# Patient Record
Sex: Female | Born: 1975 | Race: White | Hispanic: No | Marital: Single | Smoking: Former smoker
Health system: Southern US, Community
[De-identification: ages and names within clinical notes are randomized; demographics above are authoritative.]

## PROBLEM LIST (undated history)

## (undated) DIAGNOSIS — F419 Anxiety disorder, unspecified: Secondary | ICD-10-CM

## (undated) DIAGNOSIS — T7840XA Allergy, unspecified, initial encounter: Secondary | ICD-10-CM

## (undated) DIAGNOSIS — F32A Depression, unspecified: Secondary | ICD-10-CM

## (undated) DIAGNOSIS — K602 Anal fissure, unspecified: Secondary | ICD-10-CM

## (undated) DIAGNOSIS — K644 Residual hemorrhoidal skin tags: Secondary | ICD-10-CM

## (undated) DIAGNOSIS — M1990 Unspecified osteoarthritis, unspecified site: Secondary | ICD-10-CM

## (undated) DIAGNOSIS — F329 Major depressive disorder, single episode, unspecified: Secondary | ICD-10-CM

## (undated) HISTORY — PX: WISDOM TOOTH EXTRACTION: SHX21

## (undated) HISTORY — DX: Allergy, unspecified, initial encounter: T78.40XA

## (undated) HISTORY — DX: Anxiety disorder, unspecified: F41.9

## (undated) HISTORY — DX: Residual hemorrhoidal skin tags: K64.4

## (undated) HISTORY — DX: Anal fissure, unspecified: K60.2

## (undated) HISTORY — DX: Depression, unspecified: F32.A

## (undated) HISTORY — PX: ORIF FINGER / THUMB FRACTURE: SUR932

## (undated) HISTORY — DX: Major depressive disorder, single episode, unspecified: F32.9

## (undated) HISTORY — DX: Unspecified osteoarthritis, unspecified site: M19.90

---

## 2005-02-11 ENCOUNTER — Other Ambulatory Visit: Admission: RE | Admit: 2005-02-11 | Discharge: 2005-02-11 | Payer: Self-pay | Admitting: Obstetrics and Gynecology

## 2005-08-12 ENCOUNTER — Other Ambulatory Visit: Admission: RE | Admit: 2005-08-12 | Discharge: 2005-08-12 | Payer: Self-pay | Admitting: Obstetrics and Gynecology

## 2006-03-07 ENCOUNTER — Ambulatory Visit: Payer: Self-pay | Admitting: Family Medicine

## 2006-03-16 ENCOUNTER — Other Ambulatory Visit: Admission: RE | Admit: 2006-03-16 | Discharge: 2006-03-16 | Payer: Self-pay | Admitting: Obstetrics and Gynecology

## 2006-03-31 ENCOUNTER — Ambulatory Visit: Payer: Self-pay | Admitting: Family Medicine

## 2006-05-05 ENCOUNTER — Ambulatory Visit: Payer: Self-pay | Admitting: Family Medicine

## 2006-05-05 LAB — CONVERTED CEMR LAB
Basophils Absolute: 0.1 10*3/uL (ref 0.0–0.1)
Basophils Relative: 0.8 % (ref 0.0–1.0)
Cholesterol: 191 mg/dL (ref 0–200)
MCHC: 32.7 g/dL (ref 30.0–36.0)
Monocytes Absolute: 0.6 10*3/uL (ref 0.2–0.7)
Neutrophils Relative %: 60.3 % (ref 43.0–77.0)
Platelets: 248 10*3/uL (ref 150–400)
RBC: 4.77 M/uL (ref 3.87–5.11)
RDW: 11.8 % (ref 11.5–14.6)
TSH: 1.37 microintl units/mL (ref 0.35–5.50)
Triglyceride fasting, serum: 77 mg/dL (ref 0–149)

## 2006-05-15 ENCOUNTER — Ambulatory Visit: Payer: Self-pay | Admitting: Family Medicine

## 2006-06-07 ENCOUNTER — Ambulatory Visit: Payer: Self-pay | Admitting: Family Medicine

## 2006-07-05 ENCOUNTER — Ambulatory Visit: Payer: Self-pay | Admitting: Family Medicine

## 2006-08-09 ENCOUNTER — Ambulatory Visit: Payer: Self-pay | Admitting: Family Medicine

## 2006-09-06 DIAGNOSIS — J309 Allergic rhinitis, unspecified: Secondary | ICD-10-CM | POA: Insufficient documentation

## 2006-09-06 DIAGNOSIS — G4700 Insomnia, unspecified: Secondary | ICD-10-CM | POA: Insufficient documentation

## 2006-09-08 ENCOUNTER — Encounter: Payer: Self-pay | Admitting: Family Medicine

## 2006-09-08 ENCOUNTER — Encounter (INDEPENDENT_AMBULATORY_CARE_PROVIDER_SITE_OTHER): Payer: Self-pay | Admitting: Family Medicine

## 2006-09-08 ENCOUNTER — Ambulatory Visit: Payer: Self-pay | Admitting: Family Medicine

## 2006-09-08 DIAGNOSIS — F329 Major depressive disorder, single episode, unspecified: Secondary | ICD-10-CM | POA: Insufficient documentation

## 2006-09-08 DIAGNOSIS — F419 Anxiety disorder, unspecified: Secondary | ICD-10-CM

## 2007-01-02 ENCOUNTER — Ambulatory Visit: Payer: Self-pay | Admitting: Family Medicine

## 2007-01-02 ENCOUNTER — Telehealth (INDEPENDENT_AMBULATORY_CARE_PROVIDER_SITE_OTHER): Payer: Self-pay | Admitting: Family Medicine

## 2007-01-02 ENCOUNTER — Ambulatory Visit: Payer: Self-pay | Admitting: Cardiology

## 2007-01-03 ENCOUNTER — Telehealth (INDEPENDENT_AMBULATORY_CARE_PROVIDER_SITE_OTHER): Payer: Self-pay | Admitting: *Deleted

## 2007-03-26 ENCOUNTER — Ambulatory Visit: Payer: Self-pay | Admitting: Family Medicine

## 2007-03-27 ENCOUNTER — Ambulatory Visit: Payer: Self-pay | Admitting: Family Medicine

## 2007-03-28 ENCOUNTER — Encounter (INDEPENDENT_AMBULATORY_CARE_PROVIDER_SITE_OTHER): Payer: Self-pay | Admitting: *Deleted

## 2007-04-02 ENCOUNTER — Ambulatory Visit: Payer: Self-pay | Admitting: Internal Medicine

## 2007-06-14 ENCOUNTER — Ambulatory Visit: Payer: Self-pay | Admitting: Family Medicine

## 2007-06-14 LAB — CONVERTED CEMR LAB
Basophils Absolute: 0 10*3/uL (ref 0.0–0.1)
Basophils Relative: 0.2 % (ref 0.0–1.0)
Calcium: 9.7 mg/dL (ref 8.4–10.5)
Chloride: 107 meq/L (ref 96–112)
Creatinine, Ser: 0.8 mg/dL (ref 0.4–1.2)
Eosinophils Absolute: 0.1 10*3/uL (ref 0.0–0.6)
GFR calc Af Amer: 109 mL/min
GFR calc non Af Amer: 90 mL/min
Neutro Abs: 4.8 10*3/uL (ref 1.4–7.7)
Platelets: 278 10*3/uL (ref 150–400)
Potassium: 4.5 meq/L (ref 3.5–5.1)
RBC: 4.6 M/uL (ref 3.87–5.11)
Sodium: 141 meq/L (ref 135–145)
WBC: 8.2 10*3/uL (ref 4.5–10.5)

## 2007-06-15 ENCOUNTER — Encounter (INDEPENDENT_AMBULATORY_CARE_PROVIDER_SITE_OTHER): Payer: Self-pay | Admitting: Family Medicine

## 2007-06-18 ENCOUNTER — Telehealth (INDEPENDENT_AMBULATORY_CARE_PROVIDER_SITE_OTHER): Payer: Self-pay | Admitting: *Deleted

## 2007-07-11 ENCOUNTER — Ambulatory Visit: Payer: Self-pay | Admitting: Internal Medicine

## 2007-07-13 ENCOUNTER — Ambulatory Visit: Payer: Self-pay | Admitting: Internal Medicine

## 2007-07-16 ENCOUNTER — Telehealth: Payer: Self-pay | Admitting: Internal Medicine

## 2007-07-17 ENCOUNTER — Telehealth (INDEPENDENT_AMBULATORY_CARE_PROVIDER_SITE_OTHER): Payer: Self-pay | Admitting: *Deleted

## 2007-07-18 ENCOUNTER — Ambulatory Visit: Payer: Self-pay | Admitting: Internal Medicine

## 2007-07-19 ENCOUNTER — Encounter: Payer: Self-pay | Admitting: Internal Medicine

## 2007-07-24 ENCOUNTER — Encounter (INDEPENDENT_AMBULATORY_CARE_PROVIDER_SITE_OTHER): Payer: Self-pay | Admitting: *Deleted

## 2007-07-26 ENCOUNTER — Ambulatory Visit: Payer: Self-pay | Admitting: Internal Medicine

## 2007-07-27 ENCOUNTER — Telehealth (INDEPENDENT_AMBULATORY_CARE_PROVIDER_SITE_OTHER): Payer: Self-pay | Admitting: *Deleted

## 2007-07-27 LAB — CONVERTED CEMR LAB
Basophils Relative: 0.5 % (ref 0.0–1.0)
HCT: 39.9 % (ref 36.0–46.0)
Hemoglobin: 12.9 g/dL (ref 12.0–15.0)
MCV: 85.9 fL (ref 78.0–100.0)
Platelets: 337 10*3/uL (ref 150–400)
RBC: 4.65 M/uL (ref 3.87–5.11)
RDW: 12.4 % (ref 11.5–14.6)
WBC: 14.7 10*3/uL — ABNORMAL HIGH (ref 4.5–10.5)

## 2007-07-30 ENCOUNTER — Telehealth (INDEPENDENT_AMBULATORY_CARE_PROVIDER_SITE_OTHER): Payer: Self-pay | Admitting: *Deleted

## 2007-08-31 ENCOUNTER — Telehealth (INDEPENDENT_AMBULATORY_CARE_PROVIDER_SITE_OTHER): Payer: Self-pay | Admitting: *Deleted

## 2007-09-18 ENCOUNTER — Ambulatory Visit: Payer: Self-pay | Admitting: Internal Medicine

## 2007-09-18 DIAGNOSIS — B351 Tinea unguium: Secondary | ICD-10-CM | POA: Insufficient documentation

## 2007-12-12 ENCOUNTER — Ambulatory Visit: Payer: Self-pay | Admitting: Internal Medicine

## 2007-12-12 LAB — CONVERTED CEMR LAB
Specific Gravity, Urine: <1.005
Urobilinogen, UA: 0.2

## 2007-12-17 ENCOUNTER — Encounter (INDEPENDENT_AMBULATORY_CARE_PROVIDER_SITE_OTHER): Payer: Self-pay | Admitting: *Deleted

## 2008-02-04 ENCOUNTER — Ambulatory Visit: Payer: Self-pay | Admitting: Family Medicine

## 2008-02-04 LAB — CONVERTED CEMR LAB
Blood in Urine, dipstick: NEGATIVE
Glucose, Urine, Semiquant: NEGATIVE
Nitrite: NEGATIVE
Protein, U semiquant: NEGATIVE
Urobilinogen, UA: 0.2
WBC Urine, dipstick: NEGATIVE
pH: 8.5

## 2008-02-07 ENCOUNTER — Telehealth (INDEPENDENT_AMBULATORY_CARE_PROVIDER_SITE_OTHER): Payer: Self-pay | Admitting: *Deleted

## 2008-04-28 ENCOUNTER — Ambulatory Visit: Payer: Self-pay | Admitting: Internal Medicine

## 2008-04-28 DIAGNOSIS — IMO0001 Reserved for inherently not codable concepts without codable children: Secondary | ICD-10-CM

## 2008-04-29 ENCOUNTER — Encounter (INDEPENDENT_AMBULATORY_CARE_PROVIDER_SITE_OTHER): Payer: Self-pay | Admitting: *Deleted

## 2008-04-29 ENCOUNTER — Telehealth (INDEPENDENT_AMBULATORY_CARE_PROVIDER_SITE_OTHER): Payer: Self-pay | Admitting: *Deleted

## 2008-04-29 LAB — CONVERTED CEMR LAB: Calcium: 8.8 mg/dL (ref 8.4–10.5)

## 2008-04-30 ENCOUNTER — Encounter (INDEPENDENT_AMBULATORY_CARE_PROVIDER_SITE_OTHER): Payer: Self-pay | Admitting: *Deleted

## 2008-05-14 ENCOUNTER — Ambulatory Visit: Payer: Self-pay | Admitting: Internal Medicine

## 2008-05-15 ENCOUNTER — Telehealth (INDEPENDENT_AMBULATORY_CARE_PROVIDER_SITE_OTHER): Payer: Self-pay | Admitting: *Deleted

## 2008-06-03 ENCOUNTER — Ambulatory Visit: Payer: Self-pay | Admitting: Internal Medicine

## 2008-06-04 ENCOUNTER — Encounter: Payer: Self-pay | Admitting: Internal Medicine

## 2008-06-04 LAB — CONVERTED CEMR LAB
CK-MB: 2.1 ng/mL (ref 0.3–4.0)
Total CK: 116 units/L (ref 7–177)

## 2008-06-10 ENCOUNTER — Ambulatory Visit: Payer: Self-pay | Admitting: Internal Medicine

## 2008-06-10 DIAGNOSIS — D689 Coagulation defect, unspecified: Secondary | ICD-10-CM

## 2008-06-14 LAB — CONVERTED CEMR LAB: Homocysteine: 6.5 micromoles/L (ref 4.0–15.4)

## 2008-06-16 ENCOUNTER — Encounter (INDEPENDENT_AMBULATORY_CARE_PROVIDER_SITE_OTHER): Payer: Self-pay | Admitting: *Deleted

## 2008-08-29 ENCOUNTER — Telehealth (INDEPENDENT_AMBULATORY_CARE_PROVIDER_SITE_OTHER): Payer: Self-pay | Admitting: *Deleted

## 2008-09-30 ENCOUNTER — Telehealth (INDEPENDENT_AMBULATORY_CARE_PROVIDER_SITE_OTHER): Payer: Self-pay | Admitting: *Deleted

## 2008-10-02 ENCOUNTER — Telehealth (INDEPENDENT_AMBULATORY_CARE_PROVIDER_SITE_OTHER): Payer: Self-pay | Admitting: *Deleted

## 2008-10-05 ENCOUNTER — Telehealth: Payer: Self-pay | Admitting: Internal Medicine

## 2008-10-05 ENCOUNTER — Emergency Department (HOSPITAL_COMMUNITY): Admission: EM | Admit: 2008-10-05 | Discharge: 2008-10-06 | Payer: Self-pay | Admitting: Emergency Medicine

## 2008-10-06 ENCOUNTER — Ambulatory Visit: Payer: Self-pay | Admitting: *Deleted

## 2008-10-06 ENCOUNTER — Inpatient Hospital Stay (HOSPITAL_COMMUNITY): Admission: RE | Admit: 2008-10-06 | Discharge: 2008-10-07 | Payer: Self-pay | Admitting: *Deleted

## 2008-10-10 ENCOUNTER — Ambulatory Visit: Payer: Self-pay | Admitting: Internal Medicine

## 2008-10-10 DIAGNOSIS — T887XXA Unspecified adverse effect of drug or medicament, initial encounter: Secondary | ICD-10-CM

## 2008-10-11 LAB — CONVERTED CEMR LAB
ALT: 32 units/L (ref 0–35)
Albumin: 3.7 g/dL (ref 3.5–5.2)
Alkaline Phosphatase: 45 units/L (ref 39–117)
Total Protein: 6.4 g/dL (ref 6.0–8.3)

## 2008-10-13 ENCOUNTER — Encounter (INDEPENDENT_AMBULATORY_CARE_PROVIDER_SITE_OTHER): Payer: Self-pay | Admitting: *Deleted

## 2008-10-15 ENCOUNTER — Telehealth (INDEPENDENT_AMBULATORY_CARE_PROVIDER_SITE_OTHER): Payer: Self-pay | Admitting: *Deleted

## 2008-11-10 ENCOUNTER — Encounter: Payer: Self-pay | Admitting: Internal Medicine

## 2008-11-10 ENCOUNTER — Telehealth: Payer: Self-pay | Admitting: Internal Medicine

## 2008-12-11 ENCOUNTER — Encounter: Payer: Self-pay | Admitting: Internal Medicine

## 2009-01-26 ENCOUNTER — Encounter: Payer: Self-pay | Admitting: Internal Medicine

## 2009-04-28 ENCOUNTER — Encounter: Payer: Self-pay | Admitting: Internal Medicine

## 2009-06-29 ENCOUNTER — Ambulatory Visit: Payer: Self-pay | Admitting: Internal Medicine

## 2009-06-29 DIAGNOSIS — K140 Glossitis: Secondary | ICD-10-CM | POA: Insufficient documentation

## 2009-09-07 ENCOUNTER — Encounter: Payer: Self-pay | Admitting: Internal Medicine

## 2009-10-08 ENCOUNTER — Telehealth (INDEPENDENT_AMBULATORY_CARE_PROVIDER_SITE_OTHER): Payer: Self-pay | Admitting: *Deleted

## 2009-11-11 ENCOUNTER — Ambulatory Visit: Payer: Self-pay | Admitting: Internal Medicine

## 2009-11-11 LAB — CONVERTED CEMR LAB
Ketones, urine, test strip: NEGATIVE
Nitrite: UNDETERMINED
Specific Gravity, Urine: <1.005
WBC Urine, dipstick: NEGATIVE

## 2009-11-17 ENCOUNTER — Telehealth (INDEPENDENT_AMBULATORY_CARE_PROVIDER_SITE_OTHER): Payer: Self-pay | Admitting: *Deleted

## 2010-06-01 NOTE — Letter (Signed)
Summary: [facility]  [facility]   Imported By: Lanelle Bal 12/07/2009 11:44:14  _____________________________________________________________________  External Attachment:    Type:   Image     Comment:   External Document

## 2010-06-01 NOTE — Letter (Signed)
Summary: [facility]  [facility]   Imported By: Lanelle Bal 12/07/2009 11:47:02  _____________________________________________________________________  External Attachment:    Type:   Image     Comment:   External Document

## 2010-06-01 NOTE — Assessment & Plan Note (Signed)
Summary: possible uti//kn   Vital Signs:  Patient profile:   35 year old female Weight:      132 pounds Temp:     99.2 degrees F oral Pulse rate:   76 / minute Resp:     12 per minute BP sitting:   124 / 80  (left arm) Cuff size:   large  Vitals Entered By: Shonna Chock CMA (November 11, 2009 2:40 PM) CC: Possible UTI: Frequency and discomfort Comments REVIEWED MED LIST, PATIENT AGREED DOSE AND INSTRUCTION CORRECT    CC:  Possible UTI: Frequency and discomfort.  History of Present Illness: Onset 11/08/2009 as dysuria & frequency  ; Rx: Azo has helped pain.  Home test was + for Leukocytes.  Allergies (verified): No Known Drug Allergies  Review of Systems General:  Denies chills, fever, and sweats. GU:  Denies discharge and hematuria. MS:  No flank pain .  Physical Exam  General:  well-nourished,in no acute distress; alert,appropriate and cooperative throughout examination Abdomen:  Bowel sounds positive,abdomen soft and non-tender without masses, organomegaly or hernias noted. Extremities:  No clubbing, cyanosis, edema. Neg SLR  Skin:  Intact without suspicious lesions or rashes   Impression & Recommendations:  Problem # 1:  DYSURIA (ICD-788.1)  The following medications were removed from the medication list:    Doxycycline Hyclate 100 Mg Caps (Doxycycline hyclate) .Marland Kitchen... Dissolve 1 pill in 5 cc water & swish well two times a day Her updated medication list for this problem includes:    Nitrofurantoin Monohyd Macro 100 Mg Caps (Nitrofurantoin monohyd macro) .Marland Kitchen... 1 two times a day  Orders: T-Culture, Urine ([id number])  Complete Medication List: 1)  Ortho Tri-cyclen (28) 0.035 Mg Tabs (Norgestimate-ethinyl estradiol) 2)  Cymbalta 60 Mg Cpep (Duloxetine hcl) .Marland Kitchen.. 1 once daily 3)  Folic Acid 1 Mg Tabs (Folic acid) .... Take one tablet dialy 4)  Zyrtec Allergy 10 Mg Tabs (Cetirizine hcl) .... Take one tablet daily 5)  Nitrofurantoin Monohyd Macro 100 Mg Caps  (Nitrofurantoin monohyd macro) .Marland Kitchen.. 1 two times a day  Other Orders: UA Dipstick w/o Micro (manual) ([id number])  Patient Instructions: 1)  Drink as much fluid as you can tolerate for the next few days. 2)  Take 650-1000mg  of Tylenol every 4-6 hours as needed for relief of pain or comfort of fever AVOID taking more than 4000mg   in a 24 hour period (can cause liver damage in higher doses)  OR take 400-600mg  of Ibuprofen (Advil, Motrin) with food every 4-6 hours as needed for relief of pain or comfort of fever. Prescriptions: NITROFURANTOIN MONOHYD MACRO 100 MG CAPS (NITROFURANTOIN MONOHYD MACRO) 1 two times a day  #10 x 0   Entered and Authorized by:   Marga Melnick MD   Signed by:   Marga Melnick MD on 11/11/2009   Method used:   [facility]ed to ...       [facility] [phone][facility] (retail)       [address]       [place], [place]  [address]       Ph: [phone]       Fax: [phone]   RxID:   [id number]   Laboratory Results   Urine Tests    Routine Urinalysis   Color: straw Appearance: Clear Glucose: negative   (Normal Range: Negative) Bilirubin: negative   (Normal Range: Negative) Ketone: negative   (Normal Range: Negative) Spec. Gravity: <1.005   (Normal Range: 1.003-1.035) Blood: negative   (  Normal Range: Negative) pH: 7.5   (Normal Range: 5.0-8.0) Protein: negative   (Normal Range: Negative) Urobilinogen: 0.2   (Normal Range: 0-1) Nitrite: unable to determine   (Normal Range: Negative) Leukocyte Esterace: negative   (Normal Range: Negative)    Comments: Sent for culture

## 2010-06-01 NOTE — Letter (Signed)
Summary: [facility]  [facility]   Imported By: Lanelle Bal 12/07/2009 11:43:26  _____________________________________________________________________  External Attachment:    Type:   Image     Comment:   External Document

## 2010-06-01 NOTE — Letter (Signed)
Summary: [facility]  [facility]   Imported By: Lanelle Bal 12/07/2009 11:45:07  _____________________________________________________________________  External Attachment:    Type:   Image     Comment:   External Document

## 2010-06-01 NOTE — Progress Notes (Signed)
Summary: DEPRESSION MED/HOP SEE  Phone Note Call from Patient Call back at Home Phone [phone]   Caller: Patient Summary of Call: PT CALLED LEFT MSG WOULD LIKE DR HOPPER TO START PRESCRIBING DEPRESSION MED FORMERLY DONE BY DR Ilsa Iha --MED IS CYMBALTA 60 MG.Kandice Hams  October 08, 2009 3:02 PM  Initial call taken by: Kandice Hams,  October 08, 2009 2:59 PM  Follow-up for Phone Call        This is not on list here. If Dr Ilsa Iha is a Psychiatrist , all I need is a brief note stating condition stable & that the primary care MD can assume Rxing  this as  maintenance medication. Follow-up by: Marga Melnick MD,  October 09, 2009 2:28 PM  Additional Follow-up for Phone Call Additional follow up Details #1::        MED IS CYMBALTA 60 MG (ON MED LIST).  PT INFORMED OF DR HOPPER RECOMMENDATIONS, PT AGREED WILL HAVE DR Ilsa Iha FAX NOTE .Kandice Hams  October 09, 2009 2:37 PM  Additional Follow-up by: Kandice Hams,  October 09, 2009 2:37 PM

## 2010-06-01 NOTE — Letter (Signed)
Summary: [facility]  [facility]   Imported By: Lanelle Bal 12/07/2009 11:46:06  _____________________________________________________________________  External Attachment:    Type:   Image     Comment:   External Document

## 2010-06-01 NOTE — Assessment & Plan Note (Signed)
Summary: CHECK SORE IN MOUTH/RH.....   Vital Signs:  Patient profile:   35 year old female Weight:      137 pounds Temp:     98.6 degrees F oral Resp:     14 per minute BP sitting:   114 / 70  (left arm)  Vitals Entered By: Doristine Devoid (June 29, 2009 3:22 PM) CC: sore on back of tongue    CC:  sore on back of tongue .  History of Present Illness: Onset ?>  2 months  ago as pasinful  "ulcer"  of  tongue  lesion  in context of laryngitis & ST. It resolved after 2 weeks  with  saline gargles & topical H2O2 , but it recurred 2 weeks later. It heals only partially ; no Rx for pat 2 weeks. She quit smoking 12/2001 after 7 years.  Allergies: No Known Drug Allergies  Review of Systems General:  Denies chills, fever, sweats, and weight loss. ENT:  Denies nasal congestion, sinus pressure, and sore throat; No facial pain ,  or frontal headache; minimal purulence. Resp:  Denies cough and sputum productive.  Physical Exam  General:  well-nourished,in no acute distress; alert,appropriate and cooperative throughout examination Ears:  External ear exam shows no significant lesions or deformities.  Otoscopic examination reveals clear canals, tympanic membranes are intact bilaterally without bulging, retraction, inflammation or discharge. Hearing is grossly normal bilaterally. Nose:  External nasal examination shows no deformity or inflammation. Nasal mucosa are  minimally erythematous without lesions or exudates. Mouth:  Oral mucosa and oropharynx without lesions or exudates.  Teeth in good repair.Midline linear ulcer @post  1/3 of tongue Cervical Nodes:  No lymphadenopathy noted Axillary Nodes:  No palpable lymphadenopathy Psych:  normally interactive and good eye contact.     Impression & Recommendations:  Problem # 1:  GLOSSITIS (ICD-529.0) Small linear ulcer posterior tongue  Complete Medication List: 1)  Ortho Tri-cyclen (28) 0.035 Mg Tabs (Norgestimate-ethinyl estradiol) 2)   Cymbalta 60 Mg Cpep (Duloxetine hcl) .Marland Kitchen.. 1 once daily 3)  Folic Acid 1 Mg Tabs (Folic acid) .... Take one tablet dialy 4)  Zyrtec Allergy 10 Mg Tabs (Cetirizine hcl) .... Take one tablet daily 5)  Nabumetone 500 Mg Tabs (Nabumetone) .... Take 1-2 times daily 6)  Doxycycline Hyclate 100 Mg Caps (Doxycycline hyclate) .... Dissolve 1 pill in 5 cc water & swish well two times a day  Other Orders: Tdap => 66yrs IM (57846) Admin 1st Vaccine (96295)  Patient Instructions: 1)  B vitamin supplement once daily . Swish antibiotic over tongue  two times a day & swallow  Prescriptions: DOXYCYCLINE HYCLATE 100 MG CAPS (DOXYCYCLINE HYCLATE) dissolve 1 pill in 5 cc water & swish well two times a day  #20 x 0   Entered and Authorized by:   Marga Melnick MD   Signed by:   Marga Melnick MD on 06/29/2009   Method used:   [facility]ed to ...       [facility] [phone][facility] (retail)       [address]       [place], [place]  [address]       Ph: [phone]       Fax: [phone]   RxID:   [id number]    Immunizations Administered:  Tetanus Vaccine:    Vaccine Type: Tdap    Site: right deltoid    Mfr: GlaxoSmithKline    Dose: 0.5 ml  Route: IM    Given by: Doristine Devoid    Exp. Date: 11/15/2010    Lot #: [id number]

## 2010-06-01 NOTE — Progress Notes (Signed)
Summary: Psych Record Concerns  Phone Note Call from Patient Call back at Home Phone [phone]   Caller: Patient Summary of Call: Message left on Triage VM: patient filled out med release form for [facility] to get documentation that they are ok with Dr.Hopper filling her Cymbalta-? if received.   Shonna Chock CMA  November 17, 2009 2:31 PM   Follow-up for Phone Call        Spoke with patient, we received paperwork from psych, Dr.Hopper reviewed and ok'd prescribing Cymbalta. Paperwork sent to be scanned Follow-up by: Shonna Chock CMA,  November 18, 2009 8:26 AM

## 2010-07-06 ENCOUNTER — Ambulatory Visit: Payer: Self-pay | Admitting: Internal Medicine

## 2010-07-14 ENCOUNTER — Encounter: Payer: Self-pay | Admitting: Internal Medicine

## 2010-07-14 ENCOUNTER — Ambulatory Visit (INDEPENDENT_AMBULATORY_CARE_PROVIDER_SITE_OTHER): Payer: 59 | Admitting: Internal Medicine

## 2010-07-14 DIAGNOSIS — F329 Major depressive disorder, single episode, unspecified: Secondary | ICD-10-CM

## 2010-07-14 DIAGNOSIS — F411 Generalized anxiety disorder: Secondary | ICD-10-CM

## 2010-07-20 NOTE — Assessment & Plan Note (Signed)
Summary: F/U on Paperwork/CM   Vital Signs:  Patient profile:   35 year old female Weight:      130 pounds Pulse rate:   72 / minute Resp:     14 per minute BP sitting:   120 / 78  (left arm) Cuff size:   large  Vitals Entered By: Shonna Chock CMA (July 14, 2010 4:06 PM) CC: Complete paperwork    CC:  Complete paperwork .  History of Present Illness:    Cymbalta is effective tool ; her prior " overwhelming stress" is resolved. She has no adverse effects from the medication. She is applying for gun permit.  Preventive Screening-Counseling & Management  Caffeine-Diet-Exercise     Does Patient Exercise: yes  Allergies (verified): No Known Drug Allergies  Social History: Occupation: [employer] Single Former Smoker: Quit 2003 Alcohol use-yes: socially Regular exercise-yes; CVE & weights 3-5x/week  Review of Systems General:  Denies fatigue, loss of appetite, and sleep disorder. GI:  Denies abdominal pain and indigestion. Psych:  Denies anxiety, depression, easily angered, easily tearful, irritability, panic attacks, sense of great danger, and suicidal thoughts/plans.  Physical Exam  General:  ,well-nourished,in no acute distress; alert,appropriate and cooperative throughout examination Eyes:  No corneal or conjunctival inflammation noted. EOMI. No lid lag Neck:  No deformities, masses, or tenderness noted. Heart:  Normal rate and regular rhythm. S1 and S2 normal without gallop, murmur, click, rub .S4  Neurologic:  alert & oriented X3 and DTRs symmetrical and normal.  No tremor  Skin:  Intact without suspicious lesions or rashes Psych:  memory intact for recent and remote, normally interactive, good eye contact, not anxious appearing, and not depressed appearing.     Impression & Recommendations:  Problem # 1:  DEPRESSIVE DISORDER NOT ELSEWHERE CLASSIFIED (ICD-311) resolved ; no active issues Her updated medication list for this problem includes:    Cymbalta 60 Mg  Cpep (Duloxetine hcl) .Marland Kitchen... 1 once daily  Problem # 2:  ANXIETY (ICD-300.00) resolved Her updated medication list for this problem includes:    Cymbalta 60 Mg Cpep (Duloxetine hcl) .Marland Kitchen... 1 once daily  Complete Medication List: 1)  Ortho Tri-cyclen (28) 0.035 Mg Tabs (Norgestimate-ethinyl estradiol) 2)  Cymbalta 60 Mg Cpep (Duloxetine hcl) .Marland Kitchen.. 1 once daily 3)  Folic Acid 1 Mg Tabs (Folic acid) .... Take one tablet dialy 4)  Zyrtec Allergy 10 Mg Tabs (Cetirizine hcl) .... Take one tablet daily  Patient Instructions: 1)  Please schedule a follow-up appointment as needed.   Orders Added: 1)  Est. Patient Level III [16109]

## 2010-07-29 ENCOUNTER — Other Ambulatory Visit: Payer: Self-pay | Admitting: Internal Medicine

## 2010-08-09 LAB — URINALYSIS, ROUTINE W REFLEX MICROSCOPIC
Bilirubin Urine: NEGATIVE
Hgb urine dipstick: NEGATIVE
Protein, ur: NEGATIVE mg/dL
Urobilinogen, UA: 0.2 mg/dL (ref 0.0–1.0)

## 2010-08-09 LAB — COMPREHENSIVE METABOLIC PANEL
AST: 36 U/L (ref 0–37)
Albumin: 3.9 g/dL (ref 3.5–5.2)
Calcium: 9.1 mg/dL (ref 8.4–10.5)
Chloride: 102 mEq/L (ref 96–112)
Creatinine, Ser: 0.87 mg/dL (ref 0.4–1.2)
GFR calc Af Amer: >60 mL/min (ref >60)
Total Bilirubin: 0.7 mg/dL (ref 0.3–1.2)

## 2010-08-09 LAB — RAPID URINE DRUG SCREEN, HOSP PERFORMED
Amphetamines: NOT DETECTED
Benzodiazepines: POSITIVE — AB
Cocaine: NOT DETECTED
Tetrahydrocannabinol: NOT DETECTED

## 2010-08-09 LAB — POCT PREGNANCY, URINE: Preg Test, Ur: NEGATIVE

## 2010-08-09 LAB — CBC
MCV: 86.8 fL (ref 78.0–100.0)
Platelets: 208 10*3/uL (ref 150–400)
WBC: 9.3 10*3/uL (ref 4.0–10.5)

## 2010-08-09 LAB — DIFFERENTIAL
Eosinophils Relative: 2 % (ref 0–5)
Lymphocytes Relative: 28 % (ref 12–46)
Lymphs Abs: 2.6 10*3/uL (ref 0.7–4.0)
Monocytes Absolute: 0.9 10*3/uL (ref 0.1–1.0)

## 2010-08-09 LAB — TRICYCLICS SCREEN, URINE: TCA Scrn: NOT DETECTED

## 2010-08-09 LAB — ACETAMINOPHEN LEVEL: Acetaminophen (Tylenol), Serum: <10 ug/mL — ABNORMAL LOW (ref 10–30)

## 2010-09-14 NOTE — Assessment & Plan Note (Signed)
[facility]                         GASTROENTEROLOGY OFFICE NOTE   NAME:Schiff, TYMARA SAUR                    MRN:          [id number]  DATE:04/02/2007                            DOB:          [birth year]    HISTORY OF PRESENT ILLNESS:  Ms. Gaul is a very nice 35 year old  single female who comes for evaluation of intermittent rectal pain,  rectal bleeding and change in bowel habits, to accompany the patient.  She says she has always been easily constipated, and about two years ago  developed what sounds like a fissure, rectal pain then bowel movements  which have been present intermittently and is only usually associated  with straining and constipation.  She has used over-the-counter steroids  which seem to help heal the fissure, but it only opened up later.  Again, the constipation has become worse in the last year.  She was put  on Paxil CR 25 mg daily, and she also stopped regular exercise which  resulted in a 20 pound weight gain.  She is now ready to go back to  exercise and loose the weight.  She has started Benefiber on occasion,  but not on a regular basis.  Her eating habits are quite regular, but  lack in fiber.  She usually eats a banana or granola bar in the morning,  sandwich for lung and some dinner and vegetables for dinner.  She drinks  a lot of water during the day, has one cup of coffee a day.  She had  endoscopy in the last 10 years in [place], [place]  For  question of reflux, she was put on Zantac which she takes p.r.n.   MEDICATIONS:  1. Paxil CR 25 mg daily.  2. Ortho Tri-Cyclen.  3. Multivitamin.  4. Zyrtec 10 mg daily.   PAST MEDICAL HISTORY:  History of depression since December 2007.   ALLERGIES:  __________ .   OPERATION:  None.   FAMILY HISTORY:  Negative for colon cancer or irritable bowel disease.   SOCIAL HISTORY:  Single.  Works as a Science writer.  She has a high school  education.  She drinks  alcohol socially.   REVIEW OF SYSTEMS:  Positive for allergies, sleeping problems, night  sweats and muscle pains.   PHYSICAL EXAMINATION:  VITAL SIGNS:  Blood pressure 128/80, pulse 80,  weight 158.8 pounds.  GENERAL:  She appeared healthy, alert and oriented and very pleasant.  HEENT:  Sclerae nonicteric.  NECK:  Supple.  No lymphadenopathy.  SKIN:  Warm and dry.  NECK:  Supple.  No thyroid enlargement.  LUNGS:  Clear to auscultation.  COR:  Normal S1, S2.  ABDOMEN:  Soft, nontender with normoactive bowel sounds.  Liver edge at  costal margin.  No palpable mass or stool.  RECTAL/ANOSCOPIC:  Normal perianal area.  Small skin tag at the  rectovaginal wall.  There was a small 5-8 mm fissure in the anal canal  which opened up but was not tender and did not bleed.  On inspection,  there were no internal hemorrhoids and oral mucosa appeared entirely  normal.  Stool was  hemoccult negative.   IMPRESSION:  29. A 35 year old white female with chronic vomiting and constipation      which may be related to lack of exercise, lack of exercise and      depression.  Has allergy to antidepressants.  2. 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   PLAN:  1. I have advised the patient to increase the fiber intake of her      food.  She will definitely get back to exercise and loose weight.  2. The key thing would be to soften up her stools to avoid straining.      Give her Colace 100 mg a day in conjunction with Benefiber and high      fiber diet.  3. Anusol-HC suppository nightly with one refill.  4. Analpram cream 2.5% to use twice a day topically.  5. I would like to see her again in 8 weeks.  We talked about possible      surgical repair of the anal fissure which at this point does not      fibrosed and, has a good chance of healing with conservative      treatment.     Hedwig Morton. Juanda Chance, MD  Electronically Signed    DMB/MedQ  DD: 04/02/2007  DT:  04/02/2007  Job #: [id number]   cc:   Leanne Chang, M.D.

## 2010-09-14 NOTE — Discharge Summary (Signed)
[name] [name], NOH NO.:  [id number]   MEDICAL RECORD NO.:  [id number]          PATIENT TYPE:  IPS   LOCATION:  0302                          FACILITY:  [facility]   PHYSICIAN:  Jasmine Pang, M.D. DATE OF BIRTH:  1975/12/03   DATE OF ADMISSION:  10/06/2008  DATE OF DISCHARGE:  10/07/2008                               DISCHARGE SUMMARY   IDENTIFICATION:  This is a 35 year old single white female from  [place], who was admitted on a voluntary basis on October 06, 2008.   HISTORY OF PRESENT ILLNESS:  The patient reports depression and suicidal  ideation.  She admits to having a misunderstanding with a female friend.  She states she became depressed and was having suicidal ideation with  thoughts to overdose.  She took some Klonopin and Cymbalta with alcohol  and was taken to the ED by a friend.  She has had no previous suicidal  attempts.   PAST PSYCHIATRIC HISTORY:  The patient currently sees Berniece Andreas,  therapist, at [facility] for counseling.  She  sees Dr. Alwyn Ren, her primary care doctor.  He has her on Cymbalta 60 mg  daily and Klonopin 0.5 mg p.o. q.h.s.  She was on Ambien 10 mg p.o.  q.h.s., but he has stopped this since it was not helping her sleep well  enough.   SOCIAL HISTORY:  The patient works at [employer].  She lives by herself.  She has 2 close friends that are very involved in her life.  She had a  boyfriend, but states this has been a bad situation and wants to  discontinue this relationship, this is the person she was having a  misunderstanding with before she came to the emergency room.   DRUG AND ALCOHOL HISTORY:  None.   MEDICAL PROBLEMS:  None.   MEDICATIONS:  1. Cymbalta 60 mg daily.  2. Klonopin 0.5 mg p.o. q.h.s.  3. Ortho Tri-Cyclen 28 birth control pills.   HOSPITAL COURSE:  Upon admission, the patient was casually dressed with  good eye contact.  Speech was normal rate and flow.  Psychomotor  activity was  within normal limits.  Mood was somewhat anxious and  dysphoric, but affect was wide range.  There was no suicidal or  homicidal ideation.  No thoughts of self-injurious behavior.  No  auditory or visual hallucinations.  No paranoia or delusions.  Thoughts  were logical and goal-directed.  Thought content, no predominant theme.  Cognitive was grossly intact.  Insight good.  Judgment good.  Impulse  control good.  The patient wanted to go home today and stated she was  not suicidal.  She would not be a risk to herself.  I have met with the  patient and her close friend.  The friend felt she was safe going home  and was willing to take her home and it was felt the patient was safe  for discharge.   DISCHARGE DIAGNOSES:  Axis I:  Depressive disorder, not otherwise  specified.  Axis II:  None.  Axis III:  None.  Axis IV:  Moderate (problems with  primary support group, other  psychosocial stressors, burden of psychiatric illness).  Axis V:  Global assessment of functioning was 60 upon discharge.  GAF  was 50 upon admission.  GAF highest past year was 75.   DISCHARGE PLANS:  There was no specific activity level or dietary  restrictions.   POSTHOSPITAL CARE PLANS:  The patient will see her therapist, Berniece Andreas on October 27, 2008.   DISCHARGE MEDICATIONS:  1. Cymbalta 60 mg daily.  2. Klonopin 0.5 mg p.o. q.h.s., p.r.n. anxiety or insomnia.  3. Ortho Tri-Cyclen birth control pills 28 days.      Jasmine Pang, M.D.  Electronically Signed     BHS/MEDQ  D:  10/07/2008  T:  10/08/2008  Job:  [id number]

## 2010-09-14 NOTE — Assessment & Plan Note (Signed)
[facility]                         GASTROENTEROLOGY OFFICE NOTE   [name] [name], [name] [name]                    MRN:          [id number]  DATE:07/11/2007                            DOB:          1976-01-14    Ms. [name] [name] is a very nice 35 year old white female who has an anal  fissure.  We have evaluated her for it on April 02, 2007 and she  responded to topical steroids, stool softeners, and Anusol HC  suppositories with complete resolution of rectal pain.  The stool  softener worked for [name] [name], but then she developed diarrhea after having  an upper respiratory infection and she has been having loose stools up  to 4 a day, now for at least a month.  She denies any abdominal pain or  nocturnal diarrhea.  There has been no rectal bleeding.  She is unhappy  about having a skin tag at the rectal opening and she would like to have  it removed.   MEDICATIONS:  1. Zyrtec 10 mg p.o. daily.  2. Paxil CR 25 mg daily.  3. Ortho Tri-Cyclen.  4. Multiple vitamins.  5. Melatonin.  6. Probiotic.   PHYSICAL EXAMINATION:  Blood pressure 112/60.  Pulse 88.  Weight 149  pounds.  She was today in no distress.  ABDOMEN:  Unremarkable.  RECTAL:  Shows tiny skin tag at rectovaginal wall, which measured about  5 mm.  It was nontender.  There was no fissure.  Rectal tone was normal.  Stool hemoccult negative.  There was no pain on digital exam.   IMPRESSION:  37. 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  2. Diarrhea of unknown etiology, probably irritable bowel syndrome.      It was triggered off by taking Colace, which has been since then      discontinued 3 weeks ago.  I am not sure whether this is just an      irritable bowel syndrome, or an infectious etiology.  Dr. Blossom [name]      did stool cultures, according to the patient, and they were      negative.   PLAN:  1. Hold Colace.  2. Samples for Analpram cream 2.5% given to use on a p.r.n. basis.  3. Samples of Align probiotic 1 p.o. daily for 2 weeks.  4. By Dr. Clinton [name], Bentyl 10 mg dispense 30, 1 p.o. b.i.d., p.r.n.      diarrhea.  5. I have discouraged her from having the skin tag removed because of      its small size and benign appearance, also because of the fact that      she may still get pregnant and have 1 or 2 pregnancies, which may      again lead to some rectal problems, either hemorrhoids or even      recurrence of the fissure, so I would not do anything definite as      far as her rectal os tag is concerned at this point.  [name] [name]. [name] Chance, MD  Electronically Signed    [name] [name]  DD: 07/11/2007  DT: 07/12/2007  Job #: [id number]   cc:   [name] [name], M.D.

## 2010-09-17 NOTE — Assessment & Plan Note (Signed)
[facility]                        [facility] OFFICE NOTE   NAME:[name] [name]                    MRN:          [id number]  DATE:06/07/2006                            DOB:          12-08-1975    REASON FOR VISIT:  Neck pain.  [name] [name] is a 35 year old female who  reports that she has been having difficulties with irritability,  insomnia, depressed mood, and anxiety.  She reports that initially she  was having wrist and neck pain, which was evaluated extensively by a  physician through her job.  Evaluation even included an EMG.  She was  told that her symptoms were most likely due to anxiety.  [name] [name] does  report that she has been under a lot of stress at work, and at home with  her current roommate.  Over the last couple of months she has noticed  the above symptoms.  Additionally, she states that she become tearful  without any cause.  She has trouble waking up in the morning, and  although she denies any suicidal ideation, states that she would not be  unhappy if she did not wake the next morning.  She reports that she  would never hurt herself, nonetheless.  A friend was present, and states  that she has been depressed and irritable over the last 2 months.   MEDICATIONS:  1. Zyrtec.  2. Ortho-Tri-Cyclen.  3. Ambien.   OBJECTIVE:  Weight 138.8.  Temperature 99.2.  Blood pressure 122/80.  GENERAL:  We have a pleasant female who is very tearful.  She is having  difficulty speaking because she is so upset.  Followed by crying.  Mood  down.  Patient is well dressed and groomed.   IMPRESSION:  A 35 year old female with major depressive episode.   PLAN:  1. Supportive counseling was provided.  By the end of the visit      patient appeared more optimistic, and agreed with the following      treatment plan.      a.     We will refer patient to Berniece Andreas for further therapy.      b.     We will start patient on Lexapro 10 mg  daily.  Side effects       were reviewed.  2. Patient is to follow up with me in 4 weeks, or sooner if need be.      If she has any suicidal or homicidal thoughts, she is to seek      urgent medical care.  3. Patient expressed understanding.     Leanne Chang, M.D.  Electronically Signed    LA/MedQ  DD: 06/07/2006  DT: 06/07/2006  Job #: [id number]

## 2011-05-02 ENCOUNTER — Encounter: Payer: Self-pay | Admitting: Internal Medicine

## 2011-05-02 ENCOUNTER — Ambulatory Visit (INDEPENDENT_AMBULATORY_CARE_PROVIDER_SITE_OTHER): Payer: 59 | Admitting: Internal Medicine

## 2011-05-02 VITALS — BP 124/72 | HR 71 | Temp 98.7°F | Wt 133.0 lb

## 2011-05-02 DIAGNOSIS — B370 Candidal stomatitis: Secondary | ICD-10-CM

## 2011-05-02 MED ORDER — NYSTATIN 100000 UNIT/ML MT SUSP: 500000.0000 [IU] | Freq: Four times a day (QID) | OROMUCOSAL | Status: DC

## 2011-05-02 NOTE — Patient Instructions (Signed)
Use nystatin x 5 or 6 days, if not back to normal , call or see your dentist

## 2011-05-02 NOTE — Progress Notes (Signed)
  Subjective:    Patient ID: [name] [name], female    DOB: 1975-07-23, 35 y.o.   MRN: [id number]  HPI Acute visit Patient had flulike symptoms the days of Christmas including fatigue, fever, nausea and vomiting twice, cough. Did not take any antibiotics or seek medical attention. Sx self resolved but shortly after she noted a "growth" at the dorsum and lower aspect of her tongue. Looks like cotton and she was almost able to "peel it off".  Past Medical History  Diagnosis Date  . Allergy   . Depression   . Anxiety     Review of Systems Respiratory symptoms are actually better except for mild cough which is nonproductive. No further fever. On further questioning, she does not recall taking any antibiotic recently    Objective:   Physical Exam  Constitutional: She appears well-developed and well-nourished.  HENT:  Head: Normocephalic and atraumatic.  Right Ear: External ear normal.  Left Ear: External ear normal.       At the dorsum of the tongue she has a small amount of a cotton like substance  consistent with thrush, rest is normal   Cardiovascular: Normal rate and normal heart sounds.   No murmur heard. Pulmonary/Chest: Effort normal and breath sounds normal. No respiratory distress. She has no wheezes. She has no rales.        Assessment & Plan:  Thrush: findings and description of symptoms most likely due to thrush. Will treat in a standard fashion. See instructions . ENT referral if no better

## 2011-05-03 ENCOUNTER — Encounter: Payer: Self-pay | Admitting: Internal Medicine

## 2011-05-06 ENCOUNTER — Other Ambulatory Visit: Payer: Self-pay | Admitting: Internal Medicine

## 2011-05-06 NOTE — Telephone Encounter (Signed)
05-02-11 Last OV, Last filled #120 ml

## 2011-05-06 NOTE — Telephone Encounter (Signed)
Pt called to advise the the nystain had not been sent, resent medication via escribe

## 2011-05-08 ENCOUNTER — Other Ambulatory Visit: Payer: Self-pay | Admitting: Internal Medicine

## 2011-08-29 ENCOUNTER — Encounter: Payer: Self-pay | Admitting: Internal Medicine

## 2011-10-12 ENCOUNTER — Encounter: Payer: Self-pay | Admitting: *Deleted

## 2011-11-04 ENCOUNTER — Encounter: Payer: Self-pay | Admitting: Internal Medicine

## 2011-11-04 ENCOUNTER — Ambulatory Visit (INDEPENDENT_AMBULATORY_CARE_PROVIDER_SITE_OTHER): Payer: 59 | Admitting: Internal Medicine

## 2011-11-04 VITALS — BP 100/60 | HR 72 | Ht 64.0 in | Wt 133.6 lb

## 2011-11-04 DIAGNOSIS — K602 Anal fissure, unspecified: Secondary | ICD-10-CM

## 2011-11-04 NOTE — Patient Instructions (Addendum)
Anal Fissure, Adult An anal fissure is a small tear or crack in the skin around the anus. Bleeding from a fissure usually stops on its own within a few minutes. However, bleeding will often reoccur with each bowel movement until the crack heals.  CAUSES   Passing large, hard stools.   Frequent diarrheal stools.   Constipation.   Inflammatory bowel disease (Crohn's disease or ulcerative colitis).   Infections.   Anal sex.  SYMPTOMS   Small amounts of blood seen on your stools, on toilet paper, or in the toilet after a bowel movement.   Rectal bleeding.   Painful bowel movements.   Itching or irritation around the anus.  DIAGNOSIS Your caregiver will examine the anal area. An anal fissure can usually be seen with careful inspection. A rectal exam may be performed and a short tube (anoscope) may be used to examine the anal canal. TREATMENT   You may be instructed to take fiber supplements. These supplements can soften your stool to help make bowel movements easier.   Sitz baths may be recommended to help heal the tear. Do not use soap in the sitz baths.   A medicated cream or ointment may be prescribed to lessen discomfort.  HOME CARE INSTRUCTIONS   Maintain a diet high in fruits, whole grains, and vegetables. Avoid constipating foods like bananas and dairy products.   Take sitz baths as directed by your caregiver.   Drink enough fluids to keep your urine clear or pale yellow.   Only take over-the-counter or prescription medicines for pain, discomfort, or fever as directed by your caregiver. Do not take aspirin as this may increase bleeding.   Do not use ointments containing numbing medications (anesthetics) or hydrocortisone. They could slow healing.  SEEK MEDICAL CARE IF:   Your fissure is not completely healed within 3 days.   You have further bleeding.   You have a fever.   You have diarrhea mixed with blood.   You have pain.   Your problem is getting worse  rather than better.  MAKE SURE YOU:   Understand these instructions.   Will watch your condition.   Will get help right away if you are not doing well or get worse.  Document Released: 04/18/2005 Document Revised: 04/07/2011 Document Reviewed: 10/03/2010 [facility] Patient Information 2012 [place], [place].  CC: Dr Marga Melnick

## 2011-11-04 NOTE — Progress Notes (Signed)
[name] [name] 04-Apr-1976 MRN [id number]   History of Present Illness:  This is a 36 year old white female with recurrent rectal pain and occasional rectal bleeding and intermittent constipation. We saw her for similar problems in November 2008 when she had an anal fissure. She is also complaining of intermittent discomfort in the left lower quadrant. The pain may wake her up at night when she is laying on her left side. She takes stool softeners as needed. There is no family history of colon cancer. Her mother was recently diagnosed with colitis. She has been using topical steroids. She describes discomfort with bowel movements to be somewhat outside of the rectum. She has a sitting job.   Past Medical History  Diagnosis Date  . Allergy   . Depression   . Anxiety   . Anal fissure   . Arthritis    No past surgical history on file.  reports that she quit smoking about 10 years ago. She has never used smokeless tobacco. She reports that she drinks alcohol. She reports that she does not use illicit drugs. family history includes Colitis in her mother; Deep vein thrombosis in her sister; Heart disease in her paternal grandmother; Protein C deficiency in her sister; and Pulmonary embolism in her sister.  There is no history of Colon cancer. No Known Allergies      Review of Systems: Denies upper GI symptoms. She has had intentional weight loss  The remainder of the 10 point ROS is negative except as outlined in H&P   Physical Exam: General appearance  Well developed, in no distress. Eyes- non icteric. HEENT nontraumatic, normocephalic. Mouth no lesions, tongue papillated, no cheilosis. Neck supple without adenopathy, thyroid not enlarged, no carotid bruits, no JVD. Lungs Clear to auscultation bilaterally. Cor normal S1, normal S2, regular rhythm, no murmur,  quiet precordium. Abdomen: Soft relaxed abdomen with minimal tenderness in left lower quadrant. No fullness or palpable mass.  Right upper quadrant normal. Rectal: Normal perianal area. On the rectovaginal side of the rectum, there is a tenderness in a few skin tags which appear to be sensitive to contact but I do not see any fissure. Anoscopic exam reveals redundant tissue and hypertrophied papillae but no fissure or significant hemorrhoids. Stool is Hemoccult negative. Extremities no pedal edema. Skin no lesions. Neurological alert and oriented x 3. Psychological normal mood and affect.  Assessment and Plan:  Problem #1 Recurrent rectal pain suggestive of an anal fissure but I cannot identify a fissure on today's exam. She has redundant tissue at the anal orifice and some prominent rectal papillae. She was initially asking for referral to a surgeon but I don't see how surgical intervention could help this. I have asked her to take the probiotics and fiber supplements as well as stool softeners to prevent straining. To avoid the daily use of topical steroids, I have given her samples of Calmoseptine to carry with her in her purse and use it when she has a bowel movement. We discussed a possible colonoscopy to assess the left colon. She will let us know if she is interested.   11/04/2011 Lina Sar

## 2011-11-05 ENCOUNTER — Encounter: Payer: Self-pay | Admitting: Internal Medicine

## 2011-12-27 ENCOUNTER — Other Ambulatory Visit: Payer: Self-pay | Admitting: Internal Medicine

## 2011-12-29 NOTE — Telephone Encounter (Signed)
Patient needs to schedule a CPX  

## 2012-01-15 ENCOUNTER — Ambulatory Visit (INDEPENDENT_AMBULATORY_CARE_PROVIDER_SITE_OTHER): Payer: 59 | Admitting: Family Medicine

## 2012-01-15 VITALS — BP 106/68 | HR 99 | Temp 98.2°F | Resp 16 | Ht 65.0 in | Wt 127.0 lb

## 2012-01-15 DIAGNOSIS — F329 Major depressive disorder, single episode, unspecified: Secondary | ICD-10-CM

## 2012-01-15 DIAGNOSIS — R109 Unspecified abdominal pain: Secondary | ICD-10-CM

## 2012-01-15 DIAGNOSIS — R197 Diarrhea, unspecified: Secondary | ICD-10-CM

## 2012-01-15 LAB — COMPREHENSIVE METABOLIC PANEL
ALT: 17 U/L (ref 0–35)
AST: 19 U/L (ref 0–37)
Albumin: 4.7 g/dL (ref 3.5–5.2)
BUN: 17 mg/dL (ref 6–23)
Calcium: 9.2 mg/dL (ref 8.4–10.5)
Chloride: 105 mEq/L (ref 96–112)
Potassium: 4.3 mEq/L (ref 3.5–5.3)
Sodium: 138 mEq/L (ref 135–145)
Total Protein: 7.3 g/dL (ref 6.0–8.3)

## 2012-01-15 LAB — POCT URINALYSIS DIPSTICK
Bilirubin, UA: NEGATIVE
Glucose, UA: NEGATIVE
Leukocytes, UA: NEGATIVE
Nitrite, UA: NEGATIVE

## 2012-01-15 LAB — POCT UA - MICROSCOPIC ONLY
Mucus, UA: NEGATIVE
Yeast, UA: NEGATIVE

## 2012-01-15 LAB — POCT CBC
Hemoglobin: 14.6 g/dL (ref 12.2–16.2)
Lymph, poc: 2 (ref 0.6–3.4)
MCHC: 30.7 g/dL — AB (ref 31.8–35.4)
MPV: 8.2 fL (ref 0–99.8)
POC Granulocyte: 7.8 — AB (ref 2–6.9)
POC MID %: 5.9 %M (ref 0–12)
RBC: 5.2 M/uL (ref 4.04–5.48)

## 2012-01-15 MED ORDER — CIPROFLOXACIN HCL 500 MG PO TABS: 500.0000 mg | ORAL_TABLET | Freq: Every day | ORAL | Status: AC

## 2012-01-15 MED ORDER — HYOSCYAMINE SULFATE 0.125 MG SL SUBL: 0.1250 mg | SUBLINGUAL_TABLET | SUBLINGUAL | Status: DC | PRN

## 2012-01-15 MED ORDER — DULOXETINE HCL 30 MG PO CPEP: 30.0000 mg | ORAL_CAPSULE | Freq: Every day | ORAL | Status: DC

## 2012-01-15 NOTE — Progress Notes (Signed)
Subjective:    Patient ID: [name] [name], female    DOB: Aug 03, 1975, 36 y.o.   MRN: [id number]  HPI [name] [name] is a 36 y.o. female  Started 2 weeks ago - watery stools, now everything she eats - immediate diarrhea.  20 plus times per day.  Cramping with any food.  Watery stool, no blood in stool.  Has woken up due to these symptoms.  No recent travel.  Stomach hurts a little on the left side.  No vomiting, no fever.  Urinating normally and able to drink fluids.  Lost a few pounds. No known sick contacts.  No raw foods.  No recent antibiotics.   Had been taking cymbalta until 3 months ago for depression and body aches. Primary provider Marga Melnick. plans on getting pregnant next year. Tried St.John's wort, kava kava, and valerian root, and melatonin for sleep and body aches.  Does not feel like other herbal meds helping. Restarted cymbalta - 2 weeks ago.  Diarrhea only when had to go to the bathroom initially - now throughout day.  Taking Cymbalta 60mg  QD.  Has stopped other herbal meds over past 2 weeks.  Slight headache past few days.    Tx: Immodium past 5 days.Tried Align past 5 days - no relief.    Menses normal - on menses now. Past 6 days. - usual course.     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  Alcohol - 1-2 beers per night. No hard liquor. Daily marijuana - 1-2 per day, helping with pain and sleep.  Appt with Dr. Marga Melnick Oct 1st.    Seen by Dr. Juanda Chance in July for anal fissure, L sided cramping.  No hx of colonoscopy.    Review of Systems  Constitutional: Negative for fever and chills.  Gastrointestinal: Positive for abdominal pain and diarrhea. Negative for nausea, constipation, blood in stool and anal bleeding.  Genitourinary: Negative for dysuria, urgency, frequency, hematuria, difficulty urinating and menstrual problem.  other per hpi.       Objective:   Physical Exam  Constitutional: She is oriented to person, place, and time. She appears well-developed and  well-nourished. No distress.  HENT:  Head: Normocephalic and atraumatic.  Cardiovascular: Normal rate, regular rhythm, normal heart sounds and intact distal pulses.   Pulmonary/Chest: Effort normal and breath sounds normal.  Abdominal: Soft. Bowel sounds are normal. She exhibits no distension.       No focal ttp.   Neurological: She is alert and oriented to person, place, and time.  Skin: Skin is warm and dry.  Psychiatric: She has a normal mood and affect. Her behavior is normal.     Results for orders placed in visit on 01/15/12  POCT CBC      Component Value Range   WBC 10.4 (*) 4.6 - 10.2 K/uL   Lymph, poc 2.0  0.6 - 3.4   POC LYMPH PERCENT 19.2  10 - 50 %L   MID (cbc) 0.6  0 - 0.9   POC MID % 5.9  0 - 12 %M   POC Granulocyte 7.8 (*) 2 - 6.9   Granulocyte percent 74.9  37 - 80 %G   RBC 5.20  4.04 - 5.48 M/uL   Hemoglobin 14.6  12.2 - 16.2 g/dL   HCT, POC 08.6  57.8 - 47.9 %   MCV 91.3  80 - 97 fL   MCH, POC 28.1  27 - 31.2 pg   MCHC 30.7 (*) 31.8 - 35.4 g/dL  RDW, POC 14.3     Platelet Count, POC 268  142 - 424 K/uL   MPV 8.2  0 - 99.8 fL  POCT UA - MICROSCOPIC ONLY      Component Value Range   WBC, Ur, HPF, POC 0-2     RBC, urine, microscopic 2-3     Bacteria, U Microscopic trace     Mucus, UA neg     Epithelial cells, urine per micros 1-3     Crystals, Ur, HPF, POC neg     Casts, Ur, LPF, POC neg     Yeast, UA neg    POCT URINALYSIS DIPSTICK      Component Value Range   Color, UA yellow     Clarity, UA clear     Glucose, UA neg     Bilirubin, UA neg     Ketones, UA neg     Spec Grav, UA 1.025     Blood, UA small     pH, UA 5.5     Protein, UA neg     Urobilinogen, UA 0.2     Nitrite, UA neg     Leukocytes, UA Negative    hematuria - currently on menses.       Assessment & Plan:  [name] [name] is a 36 y.o. female 1. Abdominal pain  POCT CBC, POCT UA - Microscopic Only, POCT urinalysis dipstick, Comprehensive metabolic panel, Ova and parasite  screen, Clostridium difficile toxin, Stool culture  2. Diarrhea  POCT CBC, POCT UA - Microscopic Only, POCT urinalysis dipstick, Comprehensive metabolic panel, Ova and parasite screen, Clostridium difficile toxin, Stool culture   Possibly multifactorial - prior herbal use, off and on cymbalta, infectious, or underlying IBS, IBD.  Check cultures above.  Increase fluids, can continue immodium otc for now. cipro 500mg  QD for 3 days (after cx obtained). Decrease cymbalta to 30mg  QD - take with meals.   Levsin 0.125mg  tid prn - stop if any increased pain or N/V. Refer back to Dr. Juanda Chance if not improving in next 4-5 days.  rtc or ER sooner if any worsening as may need imaging/CT.   Patient Instructions  Your should receive a call or letter about your lab results within the next week to 10 days. Can take cipro for 3 days, hyoscamine only  If needed (stop this if any increase in pain, swelling in abdomen, or vomiting). Avoid herbal meds, and can decrease cymbalta to 30mg  each day.   If not improving in the next 4-5 days - may need to see Dr. Danny Lawless to determine cause of symptoms.  Return to the clinic or go to the nearest emergency room if any of your symptoms worsen or new symptoms occur.

## 2012-01-15 NOTE — Patient Instructions (Addendum)
Your should receive a call or letter about your lab results within the next week to 10 days. Can take cipro for 3 days, hyoscamine only  If needed (stop this if any increase in pain, swelling in abdomen, or vomiting). Avoid herbal meds, and can decrease cymbalta to 30mg  each day.   If not improving in the next 4-5 days - may need to see Dr. Danny Lawless to determine cause of symptoms.  Return to the clinic or go to the nearest emergency room if any of your symptoms worsen or new symptoms occur.

## 2012-01-18 LAB — CLOSTRIDIUM DIFFICILE EIA: CDIFTX: NEGATIVE

## 2012-01-20 LAB — STOOL CULTURE

## 2012-01-31 ENCOUNTER — Encounter: Payer: Self-pay | Admitting: Internal Medicine

## 2012-01-31 ENCOUNTER — Ambulatory Visit (INDEPENDENT_AMBULATORY_CARE_PROVIDER_SITE_OTHER): Payer: 59 | Admitting: Internal Medicine

## 2012-01-31 VITALS — BP 114/68 | HR 88 | Temp 98.9°F | Resp 12 | Wt 129.4 lb

## 2012-01-31 DIAGNOSIS — F329 Major depressive disorder, single episode, unspecified: Secondary | ICD-10-CM

## 2012-01-31 DIAGNOSIS — Z0000 Encounter for general adult medical examination without abnormal findings: Secondary | ICD-10-CM

## 2012-01-31 DIAGNOSIS — IMO0001 Reserved for inherently not codable concepts without codable children: Secondary | ICD-10-CM

## 2012-01-31 LAB — SEDIMENTATION RATE: Sed Rate: 3 mm/hr (ref 0–22)

## 2012-01-31 LAB — CK: Total CK: 99 U/L (ref 7–177)

## 2012-01-31 LAB — TSH: TSH: 2.2 u[IU]/mL (ref 0.35–5.50)

## 2012-01-31 LAB — LIPID PANEL
Cholesterol: 182 mg/dL (ref 0–200)
HDL: 86.9 mg/dL (ref >39.00)
LDL Cholesterol: 81 mg/dL (ref 0–99)
Total CHOL/HDL Ratio: 2
Triglycerides: 72 mg/dL (ref 0.0–149.0)
VLDL: 14.4 mg/dL (ref 0.0–40.0)

## 2012-01-31 MED ORDER — DULOXETINE HCL 30 MG PO CPEP: ORAL_CAPSULE | ORAL | Status: DC

## 2012-01-31 NOTE — Patient Instructions (Addendum)
Preventive Health Care: Exercise  30-45  minutes a day, 3-4 days a week. Walking is especially valuable in preventing Osteoporosis. Eat a low-fat diet with lots of fruits and vegetables, up to 7-9 servings per day.  Consume less than 30 grams of sugar per day from foods & drinks with High Fructose Corn Syrup as #2,3 or #4 on label. There is  minimal reduction in Alkaline Phosphatase.Dietary sources of  Alk Phos include whole grains & nuts. Alk Phos is important for optimal liver & bone health.   If you activate My Chart; the results can be released to you as soon as they populate from the lab. If you choose not to use this program; the labs have to be reviewed, copied & mailed   causing a delay in getting the results to you.

## 2012-01-31 NOTE — Progress Notes (Signed)
  Subjective:    Patient ID: [name] [name], female    DOB: 09/28/1975, 36 y.o.   MRN: [id number]  HPI  She is here for a physical;acute issues include diffuse myalgias      Review of Systems She's had a significant improvement in her myalgia pain with Cymbalta. There was not a significant response to St. John's wort, kava or other supplements. A TENS unit which she purchased has been of benefit     Objective:   Physical Exam Gen.: Thin but healthy and well-nourished in appearance. Alert, appropriate and cooperative throughout exam. Head: Normocephalic without obvious abnormalities  Eyes: No corneal or conjunctival inflammation noted. Pupils equal round reactive to light and accommodation. Fundal exam is benign without hemorrhages, exudate, papilledema. Extraocular motion intact. Vision grossly normal. Ears: External  ear exam reveals no significant lesions or deformities. Canals clear .TMs normal. Hearing is grossly normal bilaterally. Nose: External nasal exam reveals no deformity or inflammation. Nasal mucosa are pink and moist. No lesions or exudates noted.  Mouth: Oral mucosa and oropharynx reveal no lesions or exudates. Teeth in good repair. Neck: No deformities, masses, or tenderness noted. Range of motion & Thyroid normal. Lungs: Normal respiratory effort; chest expands symmetrically. Lungs are clear to auscultation without rales, wheezes, or increased work of breathing. Heart: Normal rate and rhythm. Normal S1 and S2. No gallop, click, or rub. S4 w/o murmur. Abdomen: Bowel sounds normal; abdomen soft and nontender. No masses, organomegaly or hernias noted. Genitalia:Dr Rivard                                               Musculoskeletal/extremities: No deformity or scoliosis noted of  the thoracic or lumbar spine. No clubbing, cyanosis, edema, or deformity noted. Range of motion  normal .Tone & strength  normal.Joints normal. Nail health  good. Vascular: Carotid, radial artery,  dorsalis pedis and  posterior tibial pulses are full and equal. No bruits present. Neurologic: Alert and oriented x3. Deep tendon reflexes symmetrical and normal.          Skin: Intact without suspicious lesions or rashes. Lymph: No cervical, axillary lymphadenopathy present. Psych: Mood and affect are normal. Normally interactive                                                                                         Assessment & Plan:  #1 comprehensive physical exam; no acute findings #2 see Problem List with Assessments & Recommendations Plan: see Orders

## 2012-02-20 ENCOUNTER — Other Ambulatory Visit: Payer: Self-pay

## 2012-02-20 DIAGNOSIS — F329 Major depressive disorder, single episode, unspecified: Secondary | ICD-10-CM

## 2012-02-20 DIAGNOSIS — IMO0001 Reserved for inherently not codable concepts without codable children: Secondary | ICD-10-CM

## 2012-02-20 NOTE — Telephone Encounter (Signed)
Fill #90 @ 60 mg dose. Refill X1

## 2012-02-20 NOTE — Telephone Encounter (Signed)
OV +01/15/12. last filled 01/31/12 #90 x1. Pt states Rx was sent over for wrong dose. Rx should be sent over for 60 mg but was sent for cymbalta 30 mg. Plz advise.    MW

## 2012-02-21 MED ORDER — DULOXETINE HCL 60 MG PO CPEP: 60.0000 mg | ORAL_CAPSULE | Freq: Every day | ORAL | Status: DC

## 2012-02-21 NOTE — Telephone Encounter (Signed)
I called pharmacy and left message for them to cancel out rx for Cymbalta 30 mg. New rx for Cymbalta 60 was sent over electronically. I called patient and left message informing her of status.

## 2012-05-10 ENCOUNTER — Encounter: Payer: Self-pay | Admitting: Family Medicine

## 2012-05-10 ENCOUNTER — Ambulatory Visit (INDEPENDENT_AMBULATORY_CARE_PROVIDER_SITE_OTHER): Payer: 59 | Admitting: Family Medicine

## 2012-05-10 VITALS — BP 130/80 | HR 80 | Temp 98.6°F | Ht 63.75 in | Wt 136.4 lb

## 2012-05-10 DIAGNOSIS — M545 Low back pain, unspecified: Secondary | ICD-10-CM

## 2012-05-10 DIAGNOSIS — K644 Residual hemorrhoidal skin tags: Secondary | ICD-10-CM

## 2012-05-10 MED ORDER — DIBUCAINE 1 % EX OINT: TOPICAL_OINTMENT | Freq: Three times a day (TID) | CUTANEOUS | Status: DC | PRN

## 2012-05-10 MED ORDER — CYCLOBENZAPRINE HCL 10 MG PO TABS: 10.0000 mg | ORAL_TABLET | Freq: Three times a day (TID) | ORAL | Status: DC | PRN

## 2012-05-10 MED ORDER — NAPROXEN 500 MG PO TABS: 500.0000 mg | ORAL_TABLET | Freq: Two times a day (BID) | ORAL | Status: DC

## 2012-05-10 MED ORDER — HYDROCORTISONE ACE-PRAMOXINE 2.5-1 % RE CREA: TOPICAL_CREAM | Freq: Three times a day (TID) | RECTAL | Status: DC

## 2012-05-10 NOTE — Patient Instructions (Addendum)
Use the Nupercainal to numb the area Use the Analpram to heal and shrink the hemorrhoid Take the Naproxen- twice daily w/ food- for 7-10 days and then as needed for back pain Use the flexeril as needed for muscle spasm HEAT! Call with any questions or concerns Hang in there!!!

## 2012-05-10 NOTE — Progress Notes (Signed)
  Subjective:    Patient ID: [name] [name], female    DOB: 08/16/1975, 37 y.o.   MRN: [id number]  HPI Rectal pain- has had issues previously (has seen Dr Dickie La).  For last 6 days has had painful BMs-'it's like i'm tearing'.  Very uncomfortable to sit.  Denies recent straining.  Pain is internal.  Has hx of constipation.  Hx of hemorrhoids.  Having BRB on tissue when wiping.  Has tried cleansing creams, preparation H- no relief.  LBP- initially thought it was due to menstrual cycle.  Painful to sit up straight, painful to move.  Pain is radiating around waist.  No radiation of pain to butt or thighs.  No recent heavy lifting.  sxs x10 days.  No change in sleeping situation.  Admits to trying to change positions due to rectal pain.   Review of Systems For ROS see HPI     Objective:   Physical Exam  Vitals reviewed. Constitutional: She appears well-developed and well-nourished.       Obviously uncomfortable  Genitourinary: Rectal exam shows external hemorrhoid (TTP over 1.5 cm external hemorrhoid at 7 o'clock position) and tenderness (over external hemorrhoid). Rectal exam shows no internal hemorrhoid, no fissure, no mass and anal tone normal.  Musculoskeletal:       (-) SLR bilaterally Pain w/ position change- particularly extension + paraspinal lumbar spasm No TTP over spine  Neurological: She has normal reflexes. No cranial nerve deficit. Coordination normal.          Assessment & Plan:

## 2012-05-13 NOTE — Assessment & Plan Note (Signed)
New.  No TTP over spine.  + paraspinal spasm.  Start NSAIDs and muscle relaxers.  Reviewed supportive care and red flags that should prompt return.  Pt expressed understanding and is in agreement w/ plan.

## 2012-05-13 NOTE — Assessment & Plan Note (Signed)
New.  + TTP.  Start nupercainal and analpram for pain relief and healing.  Pt to f/u w/ GI if sxs persist or worsen.  Reviewed supportive care and red flags that should prompt return.  Pt expressed understanding and is in agreement w/ plan.

## 2012-05-14 ENCOUNTER — Encounter: Payer: Self-pay | Admitting: *Deleted

## 2012-05-21 ENCOUNTER — Other Ambulatory Visit: Payer: Self-pay

## 2012-05-21 MED ORDER — FOLIC ACID 1 MG PO TABS: 1.0000 mg | ORAL_TABLET | Freq: Every day | ORAL | Status: AC

## 2012-05-21 NOTE — Telephone Encounter (Signed)
Rx refill request [facility] [address] for Folic Acid 1 mg tablet. One po daily.  Sent to pharmacy Pt scheduled for aex on 06/26/12 with SR  Darien Ramus, CMA

## 2012-06-15 ENCOUNTER — Ambulatory Visit: Payer: 59 | Admitting: Internal Medicine

## 2012-06-16 ENCOUNTER — Other Ambulatory Visit: Payer: Self-pay

## 2012-06-26 ENCOUNTER — Ambulatory Visit: Payer: Self-pay | Admitting: Obstetrics and Gynecology

## 2012-08-21 ENCOUNTER — Other Ambulatory Visit: Payer: Self-pay | Admitting: Internal Medicine

## 2012-08-31 ENCOUNTER — Other Ambulatory Visit: Payer: Self-pay | Admitting: Obstetrics and Gynecology

## 2012-10-15 ENCOUNTER — Ambulatory Visit (INDEPENDENT_AMBULATORY_CARE_PROVIDER_SITE_OTHER): Payer: 59 | Admitting: Family Medicine

## 2012-10-15 ENCOUNTER — Encounter: Payer: Self-pay | Admitting: Family Medicine

## 2012-10-15 VITALS — BP 130/80 | HR 86 | Temp 98.5°F | Ht 63.75 in | Wt 139.6 lb

## 2012-10-15 DIAGNOSIS — F3289 Other specified depressive episodes: Secondary | ICD-10-CM

## 2012-10-15 DIAGNOSIS — R197 Diarrhea, unspecified: Secondary | ICD-10-CM

## 2012-10-15 DIAGNOSIS — L049 Acute lymphadenitis, unspecified: Secondary | ICD-10-CM

## 2012-10-15 DIAGNOSIS — F329 Major depressive disorder, single episode, unspecified: Secondary | ICD-10-CM

## 2012-10-15 DIAGNOSIS — L040 Acute lymphadenitis of face, head and neck: Secondary | ICD-10-CM

## 2012-10-15 MED ORDER — DULOXETINE HCL 30 MG PO CPEP: 30.0000 mg | ORAL_CAPSULE | Freq: Every day | ORAL | Status: DC

## 2012-10-15 MED ORDER — CEPHALEXIN 500 MG PO CAPS: 500.0000 mg | ORAL_CAPSULE | Freq: Two times a day (BID) | ORAL | Status: AC

## 2012-10-15 NOTE — Progress Notes (Signed)
  Subjective:    Patient ID: [name] [name], female    DOB: [birth year], 37 y.o.   MRN: [id number]  HPI URI- R sided facial pain over R TMJ, sxs started 1 week ago.  + nasal congestion.  No ear pain.  No fevers.  Taking Mucinex cold and sinus med.  Painful to open mouth.  Has not had any changes in diet, no known injury.  + sick contacts.  Does not grind teeth.  Diarrhea- pt reports she has had sxs x6 months.  Feels this is directly related to restarting Cymbalta.  Had tried to go off med entirely but after 90 days, 'i couldn't take it anymore' and restarted.  Feels that sxs have improved since stopping excessive coffee intake.  Will feel she needs to 'run to the bathroom after eating'.     Review of Systems For ROS see HPI     Objective:   Physical Exam  Vitals reviewed. Constitutional: She is oriented to person, place, and time. She appears well-developed and well-nourished. No distress.  HENT:  Head: Normocephalic and atraumatic.  Right Ear: Tympanic membrane normal.  Left Ear: Tympanic membrane normal.  Nose: Mucosal edema and rhinorrhea present. Right sinus exhibits no maxillary sinus tenderness and no frontal sinus tenderness. Left sinus exhibits no maxillary sinus tenderness and no frontal sinus tenderness.  Mouth/Throat: Mucous membranes are normal. Posterior oropharyngeal erythema (w/ PND) present.  No pain w/ opening and closing of jaw No TTP over R TMJ or masseter  Eyes: Conjunctivae and EOM are normal. Pupils are equal, round, and reactive to light.  Neck: Normal range of motion. Neck supple.  Cardiovascular: Normal rate, regular rhythm and normal heart sounds.   Pulmonary/Chest: Effort normal and breath sounds normal. No respiratory distress. She has no wheezes. She has no rales.  Lymphadenopathy:    She has cervical adenopathy.  Neurological: She is alert and oriented to person, place, and time.  Skin: Skin is warm and dry.  Psychiatric:  Anxious Rapid speech           Assessment & Plan:

## 2012-10-15 NOTE — Patient Instructions (Addendum)
Start the Keflex twice daily x7 days (take w/ food) for the painful lymph nodes Alternate tylenol and ibuprofen every 4 hrs as needed for pain Sudafed or other decongestant for nasal congestion Drink plenty of fluids Decrease the Cymbalta to 30mg  daily and see if this improves the diarrhea Immodium as needed Call with any questions or concerns Hang in there!

## 2012-10-15 NOTE — Assessment & Plan Note (Signed)
No evidence of bacterial sinus infxn, ear infxn.  No TTP over R TMJ or masseter.  + cervical, pre-auricular LAD that are TTP.  Start abx for acute lymphadenitis.  Reviewed supportive care and red flags that should prompt return.  Pt expressed understanding and is in agreement w/ plan.

## 2012-10-15 NOTE — Assessment & Plan Note (Signed)
New to provider, ongoing for pt.  Had stopped Cymbalta entirely but 'couldn't take it' and restarted at 60mg .  Has had diarrhea x6 months since restarting meds.  Will decrease Cymbalta to 30mg  and see if sxs improve.

## 2012-10-15 NOTE — Assessment & Plan Note (Signed)
New.  Pt convinced this is due to her restarting Cymbalta.  Will decrease dose to 30mg  daily until she has f/u w/ Dr Alwyn Ren.  Encouraged immodium as needed.  Pt has f/u scheduled w/ PCP.

## 2012-11-06 ENCOUNTER — Encounter: Payer: Self-pay | Admitting: Internal Medicine

## 2012-11-06 ENCOUNTER — Ambulatory Visit (INDEPENDENT_AMBULATORY_CARE_PROVIDER_SITE_OTHER): Payer: 59 | Admitting: Internal Medicine

## 2012-11-06 VITALS — BP 114/70 | HR 82 | Temp 98.2°F | Wt 141.0 lb

## 2012-11-06 DIAGNOSIS — F329 Major depressive disorder, single episode, unspecified: Secondary | ICD-10-CM

## 2012-11-06 DIAGNOSIS — R197 Diarrhea, unspecified: Secondary | ICD-10-CM

## 2012-11-06 LAB — CBC WITH DIFFERENTIAL/PLATELET
Basophils Relative: 0.5 % (ref 0.0–3.0)
Eosinophils Relative: 2.7 % (ref 0.0–5.0)
HCT: 36.9 % (ref 36.0–46.0)
Hemoglobin: 12.4 g/dL (ref 12.0–15.0)
Lymphs Abs: 2.1 10*3/uL (ref 0.7–4.0)
Monocytes Relative: 7.5 % (ref 3.0–12.0)
Neutro Abs: 3.9 10*3/uL (ref 1.4–7.7)
WBC: 6.8 10*3/uL (ref 4.5–10.5)

## 2012-11-06 LAB — HEPATIC FUNCTION PANEL
Albumin: 3.8 g/dL (ref 3.5–5.2)
Alkaline Phosphatase: 25 U/L — ABNORMAL LOW (ref 39–117)

## 2012-11-06 LAB — BASIC METABOLIC PANEL
BUN: 12 mg/dL (ref 6–23)
Calcium: 8.4 mg/dL (ref 8.4–10.5)
GFR: 91.85 mL/min (ref >60.00)
Glucose, Bld: 86 mg/dL (ref 70–99)
Sodium: 138 mEq/L (ref 135–145)

## 2012-11-06 NOTE — Progress Notes (Signed)
  Subjective:    Patient ID: [name] [name], female    DOB: 11-10-1975, 37 y.o.   MRN: [id number]  HPI She continues to have up to 10-15 watery stools a day but this improved to 5 watery stools per dayafter decreasing Cymbalta from 60-30 mg daily 2 weeks ago. Align has been of some benefit.Her gastroenterologist  recommended a colonoscopy in 2013 but she did not want to pursue this.  She had come off the Cymbalta in an attempt to get pregnant but resumed the Cymbalta at 60 mg in January. By history she's been intolerant of SSRIs in the past.  She had been on Cymbalta for possibly 5 years without previous GI symptoms.  There've been no significant travel ,suspect food or water exposure,or antibiotic exposures in reference to diarrhea    Review of Systems  Stool studies performed at urgent care were reviewed.  Extensive stool studies were performed 01/15/12. Her white count was mildly elevated at 10,400. There was no anemia. No ova and parasites were present. Stool was negative for Salmonella or Escherichia coli. She did have questionable moderate yeast.     Objective:   Physical Exam Gen.: Healthy and well-nourished in appearance. Alert, appropriate and cooperative throughout exam. Appears younger than stated age Head: Normocephalic without obvious abnormalities  Eyes: No corneal or conjunctival inflammation noted.No icterus. Extraocular motion intact.  Ears: External  ear exam reveals no significant lesions or deformities. Canals clear .TMs normal.  Nose: External nasal exam reveals no deformity or inflammation. Nasal mucosa are pink and moist. No lesions or exudates noted.  Mouth: Oral mucosa and oropharynx reveal no lesions or exudates. Teeth in good repair. Neck: No deformities, masses, or tenderness noted. Range of motion & Thyroid normal. Lungs: Normal respiratory effort; chest expands symmetrically. Lungs are clear to auscultation without rales, wheezes, or increased work of  breathing. Heart: Normal rate and rhythm. Normal S1 and S2. No gallop, click, or rub. S4 w/o murmur. Abdomen: Bowel sounds normal; abdomen soft and nontender. No masses, organomegaly or hernias noted.                                  Musculoskeletal/extremities: No deformity or scoliosis noted of  the thoracic or lumbar spine.   No clubbing, cyanosis, edema, or significant extremity  deformity noted. Range of motion normal .Tone & strength  Normal. Joints normal . Nail health good; no onycholysis. Able to lie down & sit up w/o help. Negative SLR bilaterally Vascular: Carotid, radial artery, dorsalis pedis and  posterior tibial pulses are full and equal. No bruits present. Neurologic: Alert and oriented x3. Deep tendon reflexes symmetrical and normal.         Skin: Intact without suspicious lesions or rashes. No tenting Lymph: No cervical, axillary lymphadenopathy present. Psych: Mood and affect are normal. Normally interactive                                                                                        Assessment & Plan:  #1See Current Assessment & Plan in Problem List under specific Diagnosis

## 2012-11-06 NOTE — Assessment & Plan Note (Signed)
Because of her protracted symptoms GI evaluation is necessary to rule out any form of colitis.

## 2012-11-06 NOTE — Patient Instructions (Addendum)
Share results with all non [facility] medical staff seen  

## 2012-11-06 NOTE — Assessment & Plan Note (Signed)
The Cymbalta has been very effective; it is unclear whether this is causing the diarrhea. She should discuss with Dr. Estanislado Pandy whether this can be continued should she choose to get pregnant. Consultation with a neuro chemist such as Dr. Andee Poles could be considered to determine optimal therapy.

## 2012-12-07 ENCOUNTER — Other Ambulatory Visit (INDEPENDENT_AMBULATORY_CARE_PROVIDER_SITE_OTHER): Payer: 59

## 2012-12-07 ENCOUNTER — Encounter: Payer: Self-pay | Admitting: Internal Medicine

## 2012-12-07 ENCOUNTER — Ambulatory Visit (INDEPENDENT_AMBULATORY_CARE_PROVIDER_SITE_OTHER): Payer: 59 | Admitting: Internal Medicine

## 2012-12-07 VITALS — BP 92/60 | HR 76 | Ht 63.75 in | Wt 138.2 lb

## 2012-12-07 DIAGNOSIS — R197 Diarrhea, unspecified: Secondary | ICD-10-CM

## 2012-12-07 DIAGNOSIS — R195 Other fecal abnormalities: Secondary | ICD-10-CM

## 2012-12-07 DIAGNOSIS — K5289 Other specified noninfective gastroenteritis and colitis: Secondary | ICD-10-CM

## 2012-12-07 LAB — SEDIMENTATION RATE: Sed Rate: 10 mm/hr (ref 0–22)

## 2012-12-07 MED ORDER — MESALAMINE 1.2 G PO TBEC: 2400.0000 g | DELAYED_RELEASE_TABLET | Freq: Every day | ORAL | Status: DC

## 2012-12-07 MED ORDER — MOVIPREP 100 G PO SOLR: 1.0000 | Freq: Once | ORAL | Status: DC

## 2012-12-07 NOTE — Patient Instructions (Addendum)
You have been scheduled for a colonoscopy with propofol. Please follow written instructions given to you at your visit today.  Please pick up your prep kit at the pharmacy within the next 1-3 days. If you use inhalers (even only as needed), please bring them with you on the day of your procedure. Your physician has requested that you go to www.startemmi.com and enter the access code given to you at your visit today. This web site gives a general overview about your procedure. However, you should still follow specific instructions given to you by our office regarding your preparation for the procedure.  Your physician has requested that you go to the basement for the following lab work before leaving today: Sed Rate, Celiac Panel, TSH, IgA  We have given you samples of the following medication to take: Lialda 1.2 grams--Take 2 tablets by mouth daily  CC: Dr Alwyn Ren

## 2012-12-07 NOTE — Progress Notes (Signed)
[name] [name] Sep 26, 1975 MRN [id number]   History of Present Illness:  This is a 37 year old white female with severe diarrhea of almost one year duration. It started in September 2013 when she  changed  the dose of Cymbalta. She was on 60 mg a day for several years. Then she stopped it and she restarted again at 60 mg a day and then recently she cut back to 30 mg a day. She is not sure that the diarrhea has ever improved. It got worse after she went back on the higher dose. She denies any rectal bleeding. Her stool studies done last fall were negative but I don't have the results of them. We saw her in the past in July 2013 for rectal pain and rectal bleeding associated with constipation. She has a history of an anal fissure in November 2008. Her mother was just diagnosed with colitis but the patient doesn't know what kind of colitis. She denies lactose intolerance or celiac disease. She has gained about 20 pounds since she moved in with her boyfriend.last year. She denies N&V or decreased apetite.     Past Medical History  Diagnosis Date  . Allergy   . Depression   . Anxiety   . Anal fissure   . External hemorrhoids    Past Surgical History  Procedure Laterality Date  . Wisdom tooth extraction      reports that she quit smoking about 11 years ago. She has never used smokeless tobacco. She reports that  drinks alcohol. She reports that she uses illicit drugs (Marijuana). family history includes Colitis in her mother; Deep vein thrombosis in her sister; Heart disease in her paternal grandmother; Protein C deficiency in her sister; and Pulmonary embolism in her sister.  There is no history of Colon cancer, and Cancer, and Drug abuse, and Stroke, . No Known Allergies      Review of Systems:Denies heartburn nausea vomiting rectal bleeding  The remainder of the 10 point ROS is negative except as outlined in H&P   Physical Exam: General appearance  Well developed, in no  distress. Eyes- non icteric. HEENT nontraumatic, normocephalic. Mouth no lesions, tongue papillated, no cheilosis.No aphthous ulcers  Neck supple without adenopathy, thyroid not enlarged, no carotid bruits, no JVD. Lungs Clear to auscultation bilaterally. Cor normal S1, normal S2, regular rhythm, no murmur,  quiet precordium. Abdomen: Soft nontender with normoactive bowel sounds. Few abnormal splashes. Normal borborygmi. No tympany. Liver edge at costal margin.  Rectal:Small amount of grayish trace Hemoccult-positive stool. Normal rectal sphincter tone. No fissure.  Extremities no pedal edema. Skin no lesions. Neurological alert and oriented x 3. Psychological normal mood and affect.  Assessment and Plan:  Problem #51 37 year old white female with rather severe diarrhea which occurs nocturnally as well as during the day up to 10-15 bowel movements/24 hours. There is no specific odor to the stool.to suggest C.Diff. There is no associated weight loss to suggest malabsorption. Her stool studies were negative. Her mother was just diagnosed with colitis. I suspect patient has a new onset colitis either microscopic, ulcerative colitis or Crohn's colitis. This needs to be further evaluated. I have started her on samples of  Lialda 1.2 g 2 tablets daily. We will set her up for a colonoscopy and biopsies. We will also check a sprue profile and sedimentation rate as well as a TSH and IgA. Further disposition will be based on results of the colonoscopy and random biopsies.. I asked the patient to find out what  kind of colitis her mother has.We will consider IBD markers based on colonoscopy report.  Problem #2 anal fissure- currently not a problem   12/07/2012 Lina Sar

## 2012-12-08 ENCOUNTER — Encounter: Payer: Self-pay | Admitting: Internal Medicine

## 2012-12-10 ENCOUNTER — Encounter: Payer: Self-pay | Admitting: Internal Medicine

## 2012-12-10 LAB — TSH: TSH: 1.67 u[IU]/mL (ref 0.35–5.50)

## 2012-12-11 LAB — CELIAC PANEL 10
Gliadin IgA: 2.7 U/mL (ref <20)
Tissue Transglut Ab: 4.4 U/mL (ref <20)

## 2012-12-13 ENCOUNTER — Telehealth: Payer: Self-pay | Admitting: *Deleted

## 2012-12-13 NOTE — H&P (Signed)
History of Present Illness:   This is a 37 year old white female with severe diarrhea of almost one year duration. It started in September 2013 when she  changed  the dose of Cymbalta. She was on 60 mg a day for several years. Then she stopped it and she restarted again at 60 mg a day and then recently she cut back to 30 mg a day. She is not sure that the diarrhea has ever improved. It got worse after she went back on the higher dose. She denies any rectal bleeding. Her stool studies done last fall were negative but I don't have the results of them. We saw her in the past in July 2013 for rectal pain and rectal bleeding associated with constipation. She has a history of an anal fissure in November 2008. Her mother was just diagnosed with colitis but the patient doesn't know what kind of colitis. She denies lactose intolerance or celiac disease. She has gained about 20 pounds since she moved in with her boyfriend.last year. She denies N&V or decreased apetite.        Past Medical History   Diagnosis  Date   .  Allergy     .  Depression     .  Anxiety     .  Anal fissure     .  External hemorrhoids         Past Surgical History   Procedure  Laterality  Date   .  Wisdom tooth extraction           reports that she quit smoking about 11 years ago. She has never used smokeless tobacco. She reports that  drinks alcohol. She reports that she uses illicit drugs (Marijuana). family history includes Colitis in her mother; Deep vein thrombosis in her sister; Heart disease in her paternal grandmother; Protein C deficiency in her sister; and Pulmonary embolism in her sister.  There is no history of Colon cancer, and Cancer, and Drug abuse, and Stroke, . No Known Allergies         Review of Systems:Denies heartburn nausea vomiting rectal bleeding  The remainder of the 10 point ROS is negative except as outlined in H&P     Physical Exam: General appearance  Well developed, in no  distress. Eyes- non icteric. HEENT nontraumatic, normocephalic. Mouth no lesions, tongue papillated, no cheilosis.No aphthous ulcers   Neck supple without adenopathy, thyroid not enlarged, no carotid bruits, no JVD. Lungs Clear to auscultation bilaterally. Cor normal S1, normal S2, regular rhythm, no murmur,  quiet precordium. Abdomen: Soft nontender with normoactive bowel sounds. Few abnormal splashes. Normal borborygmi. No tympany. Liver edge at costal margin.   Rectal:Small amount of grayish trace Hemoccult-positive stool. Normal rectal sphincter tone. No fissure.   Extremities no pedal edema. Skin no lesions. Neurological alert and oriented x 3. Psychological normal mood and affect.   Assessment and Plan:   Problem #50 37 year old white female with rather severe diarrhea which occurs nocturnally as well as during the day up to 10-15 bowel movements/24 hours. There is no specific odor to the stool.to suggest C.Diff. There is no associated weight loss to suggest malabsorption. Her stool studies were negative. Her mother was just diagnosed with colitis. I suspect patient has a new onset colitis either microscopic, ulcerative colitis or Crohn's colitis. This needs to be further evaluated. I have started her on samples of  Lialda 1.2 g 2 tablets daily. We will set her up for a colonoscopy and  biopsies. We will also check a sprue profile and sedimentation rate as well as a TSH and IgA. Further disposition will be based on results of the colonoscopy and random biopsies.. I asked the patient to find out what kind of colitis her mother has.We will consider IBD markers based on colonoscopy report.   Problem #2 anal fissure- currently not a problem     12/07/2012 Lina Sar              Revision History       Date/Time User Action    > 12/08/2012  1:28 PM Hart Carwin, MD Sign      12/07/2012  5:12 PM Richardson Chiquito, CMA Sign at close encounter      12/07/2012  5:04 PM Hart Carwin, MD Sign  at close encounter               Encounter-Level Documents:

## 2012-12-14 ENCOUNTER — Ambulatory Visit (HOSPITAL_COMMUNITY)
Admission: RE | Admit: 2012-12-14 | Discharge: 2012-12-14 | Disposition: A | Discharge status: Home / Self Care | Payer: 59 | Source: Ambulatory Visit | Attending: Internal Medicine | Admitting: Internal Medicine

## 2012-12-14 ENCOUNTER — Encounter (HOSPITAL_COMMUNITY): Payer: Self-pay | Admitting: *Deleted

## 2012-12-14 ENCOUNTER — Encounter (HOSPITAL_COMMUNITY)
Admission: RE | Disposition: A | Discharge status: Home / Self Care | Payer: Self-pay | Source: Ambulatory Visit | Attending: Internal Medicine

## 2012-12-14 DIAGNOSIS — R197 Diarrhea, unspecified: Secondary | ICD-10-CM

## 2012-12-14 DIAGNOSIS — R195 Other fecal abnormalities: Secondary | ICD-10-CM

## 2012-12-14 HISTORY — PX: COLONOSCOPY: SHX5424

## 2012-12-14 SURGERY — COLONOSCOPY
Anesthesia: Moderate Sedation

## 2012-12-14 MED ORDER — FENTANYL CITRATE 0.05 MG/ML IJ SOLN
INTRAMUSCULAR | Status: AC
  Filled 2012-12-14: qty 4

## 2012-12-14 MED ORDER — DIPHENHYDRAMINE HCL 50 MG/ML IJ SOLN
INTRAMUSCULAR | Status: AC
  Filled 2012-12-14: qty 1

## 2012-12-14 MED ORDER — MIDAZOLAM HCL 10 MG/2ML IJ SOLN
INTRAMUSCULAR | Status: DC | PRN
  Administered 2012-12-14 (×4): 2 mg via INTRAVENOUS

## 2012-12-14 MED ORDER — MIDAZOLAM HCL 10 MG/2ML IJ SOLN
INTRAMUSCULAR | Status: AC
  Filled 2012-12-14: qty 4

## 2012-12-14 MED ORDER — DIPHENHYDRAMINE HCL 50 MG/ML IJ SOLN
INTRAMUSCULAR | Status: DC | PRN
  Administered 2012-12-14: 12.5 mg via INTRAVENOUS
  Administered 2012-12-14: 25 mg via INTRAVENOUS
  Administered 2012-12-14: 12.5 mg via INTRAVENOUS

## 2012-12-14 MED ORDER — DIPHENOXYLATE-ATROPINE 2.5-0.025 MG PO TABS: 1.0000 | ORAL_TABLET | Freq: Four times a day (QID) | ORAL | Status: DC | PRN

## 2012-12-14 MED ORDER — SODIUM CHLORIDE 0.9 % IV SOLN
INTRAVENOUS | Status: DC
  Administered 2012-12-14: 500 mL via INTRAVENOUS

## 2012-12-14 MED ORDER — FENTANYL CITRATE 0.05 MG/ML IJ SOLN
INTRAMUSCULAR | Status: DC | PRN
  Administered 2012-12-14 (×4): 25 ug via INTRAVENOUS

## 2012-12-14 NOTE — Interval H&P Note (Signed)
History and Physical Interval Note:  12/14/2012 10:47 AM  [name] [name]  has presented today for surgery, with the diagnosis of diarrhea  The various methods of treatment have been discussed with the patient and family. After consideration of risks, benefits and other options for treatment, the patient has consented to  Procedure(s): COLONOSCOPY (N/A) as a surgical intervention .  The patient's history has been reviewed, patient examined, no change in status, stable for surgery.  I have reviewed the patient's chart and labs.  Questions were answered to the patient's satisfaction.     Lina Sar

## 2012-12-14 NOTE — Op Note (Signed)
[facility] [address] [place], [address]   COLONOSCOPY PROCEDURE REPORT  PATIENT: [name] [name], [name] [name]  MR#: [id number] BIRTHDATE: 11/28/1975 , 37  yrs. old GENDER: Female ENDOSCOPIST: Hart Carwin, MD REFERRED BY:  none PROCEDURE DATE:  12/14/2012 PROCEDURE:   Colonoscopy with biopsy ASA CLASS:   Class I INDICATIONS:Chronic diarrhea and mother with IBD, patient having up to 10 stools/day, stool studies are negative, sed.  rate , sprue panel normal. MEDICATIONS: These medications were titrated to patient response per physician's verbal order, Fentanyl 100 mcg IV, Versed 8 mg IV, and Benadryl 50 mg IV  DESCRIPTION OF PROCEDURE:   After the risks and benefits and of the procedure were explained, informed consent was obtained.  A digital rectal exam revealed no abnormalities of the rectum.    The Pentax Ped Colon [id number]  endoscope was introduced through the anus and advanced to the terminal ileum which was intubated for a short distance .  The quality of the prep was excellent, using MoviPrep . The instrument was then slowly withdrawn as the colon was fully examined.     COLON FINDINGS: A normal appearing cecum, ileocecal valve, terminal ileum  and appendiceal orifice were identified.  The ascending, hepatic flexure, transverse, splenic flexure, descending, sigmoid colon and rectum appeared unremarkable.  No polyps or cancers were seen.  Multiple random biopsies of the area were performed. from TI and throughout the colon to r/o microscopic colitis    Retroflexed views revealed no abnormalities.     The scope was then withdrawn from the patient and the procedure completed.  COMPLICATIONS: There were no complications. ENDOSCOPIC IMPRESSION: Normal colon to terminal ileum, multiple random biopsies of the TI and colon were performed no endoscopic evidence of IBD  RECOMMENDATIONS: Await biopsy results use antidiarrheal agents    REPEAT EXAM: no  recall till age 10  cc:  _______________________________ eSigned:  Hart Carwin, MD 12/14/2012 11:26 AM     PATIENT NAME:  [name] [name], [name] [name] MR#: [id number]

## 2012-12-14 NOTE — Telephone Encounter (Signed)
Error

## 2012-12-17 ENCOUNTER — Encounter (HOSPITAL_COMMUNITY): Payer: Self-pay | Admitting: Internal Medicine

## 2012-12-17 ENCOUNTER — Encounter: Payer: Self-pay | Admitting: Internal Medicine

## 2012-12-18 ENCOUNTER — Telehealth: Payer: Self-pay | Admitting: *Deleted

## 2012-12-18 ENCOUNTER — Other Ambulatory Visit: Payer: Self-pay | Admitting: *Deleted

## 2012-12-18 MED ORDER — BUDESONIDE 3 MG PO CP24: ORAL_CAPSULE | ORAL | Status: DC

## 2012-12-18 NOTE — Telephone Encounter (Signed)
Left a message for patient to call me. 

## 2012-12-18 NOTE — Telephone Encounter (Signed)
Patient given results and recommendation. Rx sent to pharmacy. Patient to call back if too expensive. Mailed letter and information on collangeous colitis.

## 2012-12-18 NOTE — Telephone Encounter (Signed)
Message copied by Daphine Deutscher on Tue Dec 18, 2012  8:29 AM ------      Message from: Hart Carwin      Created: Mon Dec 17, 2012  9:28 PM       Rene Kocher, this pt has Collagenous colitis on biopsies. Please call Entecort 3mg , #90, 2 refills 3 po qd x 4 weeks, 6 mg po qd x 4 weeks, 3 mg po qd x 4 weeks. Let me know if too expensive. ------

## 2013-02-13 ENCOUNTER — Ambulatory Visit (INDEPENDENT_AMBULATORY_CARE_PROVIDER_SITE_OTHER): Payer: 59 | Admitting: Internal Medicine

## 2013-02-13 ENCOUNTER — Ambulatory Visit (INDEPENDENT_AMBULATORY_CARE_PROVIDER_SITE_OTHER)
Admission: RE | Admit: 2013-02-13 | Discharge: 2013-02-13 | Disposition: A | Discharge status: Home / Self Care | Payer: 59 | Source: Ambulatory Visit | Attending: Internal Medicine | Admitting: Internal Medicine

## 2013-02-13 ENCOUNTER — Encounter: Payer: Self-pay | Admitting: Internal Medicine

## 2013-02-13 VITALS — BP 119/80 | HR 72 | Temp 99.1°F | Wt 140.0 lb

## 2013-02-13 DIAGNOSIS — S99922A Unspecified injury of left foot, initial encounter: Secondary | ICD-10-CM

## 2013-02-13 DIAGNOSIS — S8990XA Unspecified injury of unspecified lower leg, initial encounter: Secondary | ICD-10-CM

## 2013-02-13 NOTE — Progress Notes (Signed)
  Subjective:    Patient ID: Teena Dunk, female    DOB: Feb 21, 1976, 37 y.o.   MRN: [id number]  Foot Injury    Acute visit 4 days ago dropped a piece of machinery on her left great toe, hurting ever since, did not bleed.  Past Medical History  Diagnosis Date  . Allergy   . Depression   . Anxiety   . Anal fissure   . External hemorrhoids    Past Surgical History  Procedure Laterality Date  . Wisdom tooth extraction    . Colonoscopy N/A 12/14/2012    Procedure: COLONOSCOPY;  Surgeon: Hart Carwin, MD;  Location: [facility];  Service: Endoscopy;  Laterality: N/A;     Review of Systems Denies any other injuries or pain proximally from the left great toe. Medication list reviewed and corrected, since the last time she was here was diagnosed with collagenous colitis and is off lialda , on entecort, feels better      Objective:   Physical Exam  Constitutional: She appears well-developed and well-nourished. No distress.  Musculoskeletal:       Feet:  Skin: She is not diaphoretic.  Psychiatric: She has a normal mood and affect. Her behavior is normal. Judgment and thought content normal.       Assessment & Plan:

## 2013-02-13 NOTE — Patient Instructions (Signed)
Please get your x-ray at the other [facility]  office located at: [address] [place], across from [facility].  Please go to the basement, this is a walk-in facility, they are open from 8:30 to 5:30 PM. Phone number [phone].  Please see your a podiatrist this week Use Tylenol or Motrin as needed for pain

## 2013-03-01 ENCOUNTER — Ambulatory Visit: Payer: 59 | Admitting: Podiatrist

## 2013-03-07 ENCOUNTER — Other Ambulatory Visit: Payer: Self-pay

## 2013-03-08 ENCOUNTER — Encounter: Payer: Self-pay | Admitting: Podiatrist

## 2013-03-08 ENCOUNTER — Ambulatory Visit (INDEPENDENT_AMBULATORY_CARE_PROVIDER_SITE_OTHER): Payer: 59 | Admitting: Podiatrist

## 2013-03-08 VITALS — Ht 64.0 in | Wt 135.0 lb

## 2013-03-08 DIAGNOSIS — B351 Tinea unguium: Secondary | ICD-10-CM

## 2013-03-08 NOTE — Patient Instructions (Signed)
Continue soaking your left great toe with epsom salt water or antibiotic soap soaks.  Apply polysporin and a bandaid during the day and try leaving open to air in evenings and eventually over night.  If you see any pus or redness please let me know.  We will re-laser your toenail again in 1 month.

## 2013-03-11 NOTE — Progress Notes (Signed)
Subjective:  Patient presents for 2nd laser on right hallux nail.  She states no change in appearance of the nail.  She also dropped an engine part on her left great toe-- the toenail had to be removed and is healing OK-- this occurred over a month ago.  Objective:  Neurovascular status intact.  Improved appearance of the right great toenail noted.  Left hallux appears to be healing nicely-  No signs of infection present  Assessment:  Onychomycosis right hallux being treated with laser therapy.  Plan:  Laserering of Toenails right hallux carried out at today's visit via Q-switch YAG laser by [employer] Laser at continuous Patient and staff were wearing appropriate laser protective goggles/eyewear Laser device was tested prior to use and safety protocols were followed Frequency of 5 Hz, Level 4, Joules 7.5 delivered.    The patient tolerated the lasering well and will call if any concerns arise.  She will return in 1 month for retreat.

## 2013-04-05 ENCOUNTER — Encounter: Payer: Self-pay | Admitting: Podiatrist

## 2013-04-05 ENCOUNTER — Ambulatory Visit: Payer: 59 | Admitting: Podiatrist

## 2013-04-05 VITALS — BP 111/72 | HR 85 | Resp 20 | Ht 64.0 in | Wt 133.0 lb

## 2013-04-05 DIAGNOSIS — B351 Tinea unguium: Secondary | ICD-10-CM

## 2013-04-08 NOTE — Progress Notes (Signed)
Subjective: Patient presents for 3rd laser treatment on the right hallux nail. She states she has noticed minimal change in appearance of the nail. She also dropped an engine part on her left great toe-- the toenail had to be removed and is healing OK-- this occurred over 2 months ago.   Objective: Neurovascular status intact. Improved appearance of the right great toenail noted. Left hallux appears to be healing nicely-  Assessment: Onychomycosis right hallux being treated with laser therapy.   Plan: Laserering of right hallux nail carried out at today's visit via Q-switch YAG laser by QClear Laser at continuous  Patient and staff were wearing appropriate laser protective goggles/eyewear  Laser device was tested prior to use and safety protocols were followed  Frequency of 5 Hz, Level 4, Joules 7.5 delivered.  The patient tolerated the lasering well and will call if any concerns arise. She will return in 1 month for retreat.

## 2013-06-24 ENCOUNTER — Ambulatory Visit: Payer: 59 | Admitting: Family Medicine

## 2013-06-24 VITALS — BP 110/70 | HR 92 | Temp 99.3°F | Resp 18 | Ht 64.0 in | Wt 138.2 lb

## 2013-06-24 DIAGNOSIS — J029 Acute pharyngitis, unspecified: Secondary | ICD-10-CM

## 2013-06-24 DIAGNOSIS — J019 Acute sinusitis, unspecified: Secondary | ICD-10-CM

## 2013-06-24 DIAGNOSIS — R509 Fever, unspecified: Secondary | ICD-10-CM

## 2013-06-24 LAB — POCT INFLUENZA A/B
Influenza A, POC: NEGATIVE
Influenza B, POC: NEGATIVE

## 2013-06-24 LAB — POCT RAPID STREP A (OFFICE): Rapid Strep A Screen: NEGATIVE

## 2013-06-24 MED ORDER — AMOXICILLIN 500 MG PO CAPS: 500.0000 mg | ORAL_CAPSULE | Freq: Three times a day (TID) | ORAL | Status: DC

## 2013-06-24 MED ORDER — HYDROCODONE-HOMATROPINE 5-1.5 MG/5ML PO SYRP: 5.0000 mL | ORAL_SOLUTION | Freq: Every evening | ORAL | Status: DC | PRN

## 2013-06-24 NOTE — Progress Notes (Signed)
Chief Complaint:  Chief Complaint  Patient presents with  . Nasal Congestion    Started Thursday  . Sore Throat  . Cough    HPI: [name] [name] is a 38 y.o. female who is here for 5 day history of sneezing, sore throat, and nasal congestion and she has been up in last 2 nights because she can't go back to sleep. She is having fevers and chills. Taking Tylenol cold and zycam. Taking Halls and nasal spray. She is not around any children. Lots of sickness around work. Her boyfriend had the flu 3 weeks ago and she did not get it. Fever started this morning. She feels bad now. She has had some tighness in her chest and breathing heavy. She has sinus pressure. She is coughing up clear with some yellow, but today she is coughing up green sputum. She denies smoking, denies asthma, + allergies. Denies ear pain.   Past Medical History  Diagnosis Date  . Allergy   . Depression   . Anxiety   . Anal fissure   . External hemorrhoids    Past Surgical History  Procedure Laterality Date  . Wisdom tooth extraction    . Colonoscopy N/A 12/14/2012    Procedure: COLONOSCOPY;  Surgeon: Hart Carwin, MD;  Location: [facility];  Service: Endoscopy;  Laterality: N/A;   History   Social History  . Marital Status: Single    Spouse Name: N/A    Number of Children: 0  . Years of Education: N/A   Occupational History  .     Social History Main Topics  . Smoking status: Former Smoker    Quit date: 05/02/2001  . Smokeless tobacco: Never Used  . Alcohol Use: Yes     Comment: 1-2 beers a day  . Drug Use: No     Comment: 1-2 times daily  . Sexual Activity: None   Other Topics Concern  . None   Social History Narrative  . None   Family History  Problem Relation Age of Onset  . Protein C deficiency Sister   . Deep vein thrombosis Sister   . Pulmonary embolism Sister   . Colon cancer Neg Hx   . Cancer Neg Hx   . Drug abuse Neg Hx   . Stroke Neg Hx   . Heart disease Paternal  Grandmother   . Colitis Mother    No Known Allergies Prior to Admission medications   Medication Sig Start Date End Date Taking? Authorizing Provider  BIOTIN PO Take by mouth.   Yes Historical Provider, MD  cetirizine (ZYRTEC) 10 MG tablet Take 10 mg by mouth daily.     Yes Historical Provider, MD  Norgestimate-Ethinyl Estradiol Triphasic (ORTHO TRI-CYCLEN, 28,) 0.18/0.215/0.25 MG-35 MCG tablet Take 1 tablet by mouth daily.     Yes Historical Provider, MD  sertraline (ZOLOFT) 25 MG tablet Take 100 mg by mouth daily.    Yes Historical Provider, MD  Cholecalciferol (VITAMIN D-3 PO) Take by mouth daily.    Historical Provider, MD  cyclobenzaprine (FLEXERIL) 10 MG tablet Take 1 tablet (10 mg total) by mouth 3 (three) times daily as needed for muscle spasms. 05/10/12   Sheliah Hatch, MD  diphenoxylate-atropine (LOMOTIL) 2.5-0.025 MG per tablet Take 1 tablet by mouth 4 (four) times daily as needed for diarrhea or loose stools. 12/14/12   Hart Carwin, MD     ROS: The patient denies night sweats, unintentional weight loss, chest pain, palpitations,  wheezing, dyspnea on exertion, nausea, vomiting, abdominal pain, dysuria, hematuria, melena, numbness,  or tingling.  All other systems have been reviewed and were otherwise negative with the exception of those mentioned in the HPI and as above.    PHYSICAL EXAM: Filed Vitals:   06/24/13 0858  BP: 110/70  Pulse: 92  Temp: 99.3 F (37.4 C)  Resp: 18   Filed Vitals:   06/24/13 0858  Height: 5\' 4"  (1.626 m)  Weight: 138 lb 3.2 oz (62.687 kg)   Body mass index is 23.71 kg/(m^2).  General: Alert, no acute distress HEENT:  Normocephalic, atraumatic, oropharynx patent. EOMI, PERRLA. + sinus tenderness, TM nl. Erythematous throat, no exudates Cardiovascular:  Regular rate and rhythm, no rubs murmurs or gallops.  No Carotid bruits, radial pulse intact. No pedal edema.  Respiratory: Clear to auscultation bilaterally.  No wheezes, rales, or  rhonchi.  No cyanosis, no use of accessory musculature GI: No organomegaly, abdomen is soft and non-tender, positive bowel sounds.  No masses. Skin: No rashes. Neurologic: Facial musculature symmetric. Psychiatric: Patient is appropriate throughout our interaction. Lymphatic: No cervical lymphadenopathy Musculoskeletal: Gait intact.   LABS: Results for orders placed in visit on 06/24/13  POCT RAPID STREP A (OFFICE)      Result Value Ref Range   Rapid Strep A Screen Negative  Negative  POCT INFLUENZA A/B      Result Value Ref Range   Influenza A, POC Negative     Influenza B, POC Negative       EKG/XRAY:   [facility]   ASSESSMENT/PLAN: Encounter Diagnoses  Name Primary?  . Sore throat   . Fever   . Acute sinusitis Yes   Rx Amoxacillin Rx Hycodan Cepachol Salt water gargles F/u prn  Gross sideeffects, risk and benefits, and alternatives of medications d/w patient. Patient is aware that all medications have potential sideeffects and we are unable to predict every sideeffect or drug-drug interaction that may occur.  LE, THAO PHUONG, DO 06/24/2013 10:26 AM

## 2013-06-24 NOTE — Patient Instructions (Signed)

## 2013-07-06 ENCOUNTER — Telehealth: Payer: Self-pay

## 2013-07-06 NOTE — Telephone Encounter (Addendum)
pts FMLA ppw is currently in Dr.Le's box for completion. Pt has already paid her $15 over the phone.

## 2013-07-11 DIAGNOSIS — Z0271 Encounter for disability determination: Secondary | ICD-10-CM

## 2013-07-15 NOTE — Telephone Encounter (Signed)
Fmla ppw is completed and has been faxed to [phone]. A copy will be scanned into the system.

## 2013-07-17 ENCOUNTER — Encounter: Payer: Self-pay | Admitting: Family Medicine

## 2013-11-05 ENCOUNTER — Other Ambulatory Visit: Payer: Self-pay | Admitting: Obstetrics and Gynecology

## 2014-02-12 ENCOUNTER — Encounter: Payer: Self-pay | Admitting: Podiatrist

## 2014-02-12 ENCOUNTER — Ambulatory Visit (INDEPENDENT_AMBULATORY_CARE_PROVIDER_SITE_OTHER): Payer: 59 | Admitting: Podiatrist

## 2014-02-12 VITALS — BP 103/63 | HR 92 | Resp 16

## 2014-02-12 DIAGNOSIS — B351 Tinea unguium: Secondary | ICD-10-CM

## 2014-02-12 MED ORDER — EFINACONAZOLE 10 % EX SOLN: 1.0000 [drp] | Freq: Every day | CUTANEOUS | Status: DC

## 2014-02-12 NOTE — Patient Instructions (Signed)
I am calling you in a medication called Jublia to a pharmacy that will be able to get you the best price on this particular medication-- it is called [facility].  They will call to confirm that you are interested in the topical therapy-- they are located out of [place].  If you do not hear from them within 2-3 days, please let our office know.  You apply the topical to unpolished nails daily.    

## 2014-02-17 NOTE — Progress Notes (Signed)
Subjective: [name] [name] presents today for followup of right great toenail where we were lasering the toenail. She states it's much better and she's happy with the success of the laser procedure. She also wants to know my opinion on the left great toenail where she dropped an engine on the toenail several months ago and it's growing funny.  Objective: Neurovascular status intact bilateral. Right great toenail appears clear from the lasering procedure. Left great toenail does appear damaged from the recent injury he does have discoloration noted. Difficult to assess dystrophy versus mycotic appearance of the nail.  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 Also took a sample of the nail to have evaluated for fungal elements. We'll call with the result of the culture. May be a candidate for laser therapy since it was successful on the right foot depending upon the culture.

## 2014-03-07 ENCOUNTER — Ambulatory Visit (INDEPENDENT_AMBULATORY_CARE_PROVIDER_SITE_OTHER): Payer: 59 | Admitting: Family Medicine

## 2014-03-07 VITALS — BP 108/62 | HR 95 | Temp 98.6°F | Resp 16 | Ht 64.0 in | Wt 136.0 lb

## 2014-03-07 DIAGNOSIS — R350 Frequency of micturition: Secondary | ICD-10-CM

## 2014-03-07 DIAGNOSIS — K5900 Constipation, unspecified: Secondary | ICD-10-CM

## 2014-03-07 LAB — POCT UA - MICROSCOPIC ONLY
Bacteria, U Microscopic: NEGATIVE
CASTS, UR, LPF, POC: NEGATIVE
CRYSTALS, UR, HPF, POC: NEGATIVE
Mucus, UA: NEGATIVE
RBC, urine, microscopic: NEGATIVE
Yeast, UA: NEGATIVE

## 2014-03-07 LAB — POCT URINALYSIS DIPSTICK
Bilirubin, UA: NEGATIVE
Glucose, UA: NEGATIVE
Ketones, UA: NEGATIVE
NITRITE UA: NEGATIVE
PH UA: 7
Protein, UA: NEGATIVE
RBC UA: NEGATIVE
Spec Grav, UA: 1.01
UROBILINOGEN UA: 0.2

## 2014-03-07 MED ORDER — POLYETHYLENE GLYCOL 3350 17 GM/SCOOP PO POWD: 17.0000 g | Freq: Two times a day (BID) | ORAL | Status: DC | PRN

## 2014-03-07 NOTE — Progress Notes (Signed)
Subjective:    Patient ID: [name] [name], female    DOB: Sep 05, 1975, 38 y.o.   MRN: [id number] Patient Active Problem List   Diagnosis Date Noted  . Injury of toe on left foot 02/13/2013  . Acute lymphadenitis of face, head and neck 10/15/2012  . Diarrhea 10/15/2012  . External hemorrhoid 05/10/2012  . Low back pain 05/10/2012  . OTHER AND UNSPECIFIED COAGULATION DEFECTS 06/10/2008  . UNSPECIFIED MYALGIA AND MYOSITIS 04/28/2008  . ANXIETY 09/08/2006  . Depressive disorder, not elsewhere classified 09/08/2006  . ALLERGIC RHINITIS 09/06/2006  . INSOMNIA 09/06/2006   Prior to Admission medications   Medication Sig Start Date End Date Taking? Authorizing Provider  buPROPion (WELLBUTRIN) 75 MG tablet Take 75 mg by mouth 2 (two) times daily.   Yes Historical Provider, MD  folic acid (FOLVITE) 1 MG tablet Take 1 mg by mouth daily.   Yes Historical Provider, MD  sertraline (ZOLOFT) 25 MG tablet Take 100 mg by mouth daily.    Yes Historical Provider, MD  traZODone (DESYREL) 100 MG tablet Take 100 mg by mouth at bedtime.   Yes Historical Provider, MD  BIOTIN PO Take by mouth.    Historical Provider, MD  cetirizine (ZYRTEC) 10 MG tablet Take 10 mg by mouth daily.      Historical Provider, MD  Cholecalciferol (VITAMIN D-3 PO) Take by mouth daily.    Historical Provider, MD  cyclobenzaprine (FLEXERIL) 10 MG tablet Take 1 tablet (10 mg total) by mouth 3 (three) times daily as needed for muscle spasms. 05/10/12   Sheliah HatchKatherine E Tabori, MD  diphenoxylate-atropine (LOMOTIL) 2.5-0.025 MG per tablet Take 1 tablet by mouth 4 (four) times daily as needed for diarrhea or loose stools. 12/14/12   Hart Carwinora M Brodie, MD  Efinaconazole 10 % SOLN Apply 1 drop topically daily. 02/12/14   Delories HeinzKathryn P Egerton, DPM  HYDROcodone-homatropine (HYCODAN) 5-1.5 MG/5ML syrup Take 5 mLs by mouth at bedtime as needed for cough. 06/24/13   Thao P Le, DO  Norgestimate-Ethinyl Estradiol Triphasic (ORTHO TRI-CYCLEN, 28,) 0.18/0.215/0.25  MG-35 MCG tablet Take 1 tablet by mouth daily.      Historical Provider, MD   No Known Allergies  HPI  This is a 38 year old female presenting with urinary frequency and hesitancy x 1 week. She feels her symptoms are worsening. She reports she has had UTIs before with the last one over a year ago. This feels somewhat like a typical UTI to her. She endorses feeling a "pinch" in her lower abdomen when she clenches but otherwise no abdominal pain. She is not having any fevers, chills, flank pain, hematuria, N/V/D, vaginal discharge. Her last LMP was October 9th. She is sexually active with a new partner but not worried about STDs. She has noticed she is more constipated the past 1 week. She has tried stool softeners without relief. She normally has bowel movements daily but over the past week she is having bowel movements less frequently and when she does she is going less than usual. She reports she has changed her diet in the past week and usually when she "eats worse" she has problems with constipation.  Review of Systems  Constitutional: Negative for fever and chills.  Gastrointestinal: Positive for abdominal pain and constipation. Negative for nausea, vomiting and diarrhea.  Genitourinary: Positive for frequency. Negative for dysuria, flank pain and vaginal discharge.  Skin: Negative for rash.  Allergic/Immunologic: Positive for environmental allergies.      Objective:   Physical Exam  Constitutional: She is oriented to person, place, and time. She appears well-developed and well-nourished. No distress.  HENT:  Head: Normocephalic and atraumatic.  Right Ear: Hearing normal.  Left Ear: Hearing normal.  Nose: Nose normal.  Eyes: Conjunctivae and lids are normal. Right eye exhibits no discharge. Left eye exhibits no discharge. No scleral icterus.  Cardiovascular: Normal rate, regular rhythm, normal heart sounds and normal pulses.   No murmur heard. Pulmonary/Chest: Effort normal and breath  sounds normal. No respiratory distress. She has no wheezes. She has no rhonchi. She has no rales.  Abdominal: Soft. Normal appearance. There is no tenderness. There is no rebound, no guarding and no CVA tenderness.  Musculoskeletal: Normal range of motion.  Neurological: She is alert and oriented to person, place, and time.  Skin: Skin is warm, dry and intact. No lesion and no rash noted.  Psychiatric: She has a normal mood and affect. Her speech is normal and behavior is normal. Thought content normal.    Results for orders placed or performed in visit on 03/07/14  POCT urinalysis dipstick  Result Value Ref Range   Color, UA yellow    Clarity, UA clear    Glucose, UA neg    Bilirubin, UA neg    Ketones, UA neg    Spec Grav, UA 1.010    Blood, UA neg    pH, UA 7.0    Protein, UA neg    Urobilinogen, UA 0.2    Nitrite, UA neg    Leukocytes, UA Trace   POCT UA - Microscopic Only  Result Value Ref Range   WBC, Ur, HPF, POC 0-2    RBC, urine, microscopic neg    Bacteria, U Microscopic neg    Mucus, UA neg    Epithelial cells, urine per micros 0-3    Crystals, Ur, HPF, POC neg    Casts, Ur, LPF, POC neg    Yeast, UA neg       Assessment & Plan:  1. Urinary frequency 2. Constipation, unspecified constipation type  UA was negative, urine culture pending. Patient's urinary frequency is likely due to constipation. She will buy miralax OTC and take twice a day for 3 days and then once a day for 7-10 days. If her symptoms do not improve after a trial of miralax she was instructed to return or go see her GYN to be tested for STDs. She did not want to be tested today.  - POCT urinalysis dipstick - POCT UA - Microscopic Only - Urine culture  Roswell MinersNicole V. Dyke BrackettBush, PA-C, MHS [facility]  03/07/2014

## 2014-03-07 NOTE — Patient Instructions (Signed)
Take 1 cap of miralax twice a day for 3 days, then once a day for 7-10 days Stay hydrated. If symptoms continue, return to clinic or see your GYN. We will culture your urine and let you know the results.

## 2014-03-09 LAB — URINE CULTURE
COLONY COUNT: NO GROWTH
ORGANISM ID, BACTERIA: NO GROWTH

## 2014-07-10 ENCOUNTER — Ambulatory Visit (INDEPENDENT_AMBULATORY_CARE_PROVIDER_SITE_OTHER): Payer: 59 | Admitting: Medical

## 2014-07-10 ENCOUNTER — Encounter: Payer: Self-pay | Admitting: Medical

## 2014-07-10 VITALS — Temp 98.8°F

## 2014-07-10 DIAGNOSIS — M5432 Sciatica, left side: Secondary | ICD-10-CM

## 2014-07-10 DIAGNOSIS — J0100 Acute maxillary sinusitis, unspecified: Secondary | ICD-10-CM | POA: Insufficient documentation

## 2014-07-10 DIAGNOSIS — M5430 Sciatica, unspecified side: Secondary | ICD-10-CM | POA: Insufficient documentation

## 2014-07-10 MED ORDER — CEFDINIR 300 MG PO CAPS: 300.0000 mg | ORAL_CAPSULE | Freq: Two times a day (BID) | ORAL | Status: DC

## 2014-07-10 MED ORDER — DICLOFENAC SODIUM 75 MG PO TBEC: 75.0000 mg | DELAYED_RELEASE_TABLET | Freq: Two times a day (BID) | ORAL | Status: DC

## 2014-07-10 NOTE — Progress Notes (Signed)
Subjective:    Patient ID: [name] [name], female    DOB: [birth year], 39 y.o.   MRN: [id number]  HPI   Monday with sinus pressure, sneezing, st(faint), runny nose and occasional cough. Hx of year round allergies. Pt takes zytec but on Monday forgot to take. Some year round allergies. If skips meds her symptoms do come on quick.   Pt also reports some mild left hamstring pain. Hx of this before in the past(Flare of in past week). Pt saw chiropracter who thinks this is sciatica. She had adjustment recently.  Some low back pain.  Lmp- 1 wk ago.  Review of Systems  Constitutional: Negative for fever, chills and fatigue.  HENT: Positive for sinus pressure, sneezing and sore throat.        Teeth pain sensation with sinus pressure.  Respiratory: Negative for chest tightness, shortness of breath and wheezing.   Cardiovascular: Negative for chest pain and palpitations.  Skin:       Faint elbow pain.  Neurological: Positive for headaches.       But more direct over her sinus regions.  Psychiatric/Behavioral: Negative for behavioral problems, confusion, sleep disturbance, self-injury, dysphoric mood and decreased concentration.   Past Medical History  Diagnosis Date  . Allergy   . Depression   . Anxiety   . Anal fissure   . External hemorrhoids     History   Social History  . Marital Status: Single    Spouse Name: N/A  . Number of Children: 0  . Years of Education: N/A   Occupational History  .     Social History Main Topics  . Smoking status: Former Smoker    Quit date: 05/02/2001  . Smokeless tobacco: Never Used  . Alcohol Use: Yes     Comment: 1-2 beers a day  . Drug Use: No     Comment: 1-2 times daily  . Sexual Activity: Not on file   Other Topics Concern  . Not on file   Social History Narrative    Past Surgical History  Procedure Laterality Date  . Wisdom tooth extraction    . Colonoscopy N/A 12/14/2012    Procedure: COLONOSCOPY;  Surgeon: Hart Carwin, MD;  Location: [facility];  Service: Endoscopy;  Laterality: N/A;    Family History  Problem Relation Age of Onset  . Protein C deficiency Sister   . Deep vein thrombosis Sister   . Pulmonary embolism Sister   . Colon cancer Neg Hx   . Cancer Neg Hx   . Drug abuse Neg Hx   . Stroke Neg Hx   . Heart disease Paternal Grandmother   . Colitis Mother     No Known Allergies  Current Outpatient Prescriptions on File Prior to Visit  Medication Sig Dispense Refill  . buPROPion (WELLBUTRIN) 75 MG tablet Take 75 mg by mouth 2 (two) times daily.    . cetirizine (ZYRTEC) 10 MG tablet Take 10 mg by mouth daily.      . folic acid (FOLVITE) 1 MG tablet Take 1 mg by mouth daily.    . sertraline (ZOLOFT) 25 MG tablet Take 100 mg by mouth daily.     . traZODone (DESYREL) 100 MG tablet Take 100 mg by mouth at bedtime.     No current facility-administered medications on file prior to visit.    Temp(Src) 98.8 F (37.1 C) (Oral)  LMP 07/01/2014      Objective:   Physical Exam  General  Mental Status - Alert. General Appearance - Well groomed. Not in acute distress.  Skin Rashes- No Rashes.  HEENT Head- Normal. Ear Auditory Canal - Left- Normal. Right - Normal.Tympanic Membrane- Left- Normal. Right- Normal. Eye Sclera/Conjunctiva- Left- Normal. Right- Normal. Nose & Sinuses Nasal Mucosa- Left-  Boggy and Congested. Right-  Boggy and  Congested.Bilateral maxillary and frontal sinus pressure. Mouth & Throat Lips: Upper Lip- Normal: no dryness, cracking, pallor, cyanosis, or vesicular eruption. Lower Lip-Normal: no dryness, cracking, pallor, cyanosis or vesicular eruption. Buccal Mucosa- Bilateral- No Aphthous ulcers. Oropharynx- No Discharge or Erythema. Tonsils: Characteristics- Bilateral- No Erythema or Congestion. Size/Enlargement- Bilateral- No enlargement. Discharge- bilateral-None.  Neck Neck- Supple. No Masses.   Chest and Lung Exam Auscultation: Breath  Sounds:-Clear even and unlabored.  Cardiovascular Auscultation:Rythm- Regular, rate and rhythm. Murmurs & Other Heart Sounds:Ausculatation of the heart reveal- No Murmurs.  Lymphatic Head & Neck General Head & Neck Lymphatics: Bilateral: Description- No Localized lymphadenopathy.    Back Mid lumbar spine tenderness to palpation. Mild pain on palpation lt hamstring No Pain on lateral movements and flexion/extension of the spine.  Lower ext neurologic  L5-S1 sensation intact bilaterally. Normal patellar reflexes bilaterally. No foot drop bilaterally.  Upper ext- on rom of motion today. No pain noted.       Assessment & Plan:  Pt speculated flu but she does not appear flu like. I offered flu test pt declined. Also 4 days into above.

## 2014-07-10 NOTE — Assessment & Plan Note (Addendum)
Preceded by allergies. Continue zyrtec.  Your appear to have a sinus infection. I am prescribing cefdinir antibiotic for the infection. To help with the nasal congestion you could use otc  nasal steroid. If you cough worsens let us know then rx med for.  Rest, hydrate, tylenol for fever.  Follow up in 7 days or as needed.

## 2014-07-10 NOTE — Patient Instructions (Addendum)
Sinusitis, acute maxillary Preceded by allergies.  Your appear to have a sinus infection. I am prescribing cefdinir antibiotic for the infection. To help with the nasal congestion you could use otc  nasal steroid. If you cough worsens let us know then rx med for.  Rest, hydrate, tylenol for fever.  Follow up in 7 days or as needed.   Sciatica Diclofenac rx. Conservative measures. Pt declines physical therapy.     Follow up 7 days or as needed.

## 2014-07-10 NOTE — Progress Notes (Signed)
Pre visit review using our clinic review tool, if applicable. No additional management support is needed unless otherwise documented below in the visit note. 

## 2014-07-10 NOTE — Assessment & Plan Note (Signed)
Diclofenac rx. Conservative measures. Pt declines physical therapy.

## 2014-09-10 ENCOUNTER — Other Ambulatory Visit: Payer: Self-pay | Admitting: Obstetrics and Gynecology

## 2015-05-27 ENCOUNTER — Encounter: Payer: Self-pay | Admitting: Physician Assistant

## 2015-05-27 ENCOUNTER — Ambulatory Visit (INDEPENDENT_AMBULATORY_CARE_PROVIDER_SITE_OTHER): Payer: 59 | Admitting: Physician Assistant

## 2015-05-27 VITALS — BP 120/74 | HR 95 | Temp 98.3°F | Ht 64.0 in | Wt 156.0 lb

## 2015-05-27 DIAGNOSIS — B9689 Other specified bacterial agents as the cause of diseases classified elsewhere: Secondary | ICD-10-CM

## 2015-05-27 DIAGNOSIS — J019 Acute sinusitis, unspecified: Secondary | ICD-10-CM | POA: Diagnosis not present

## 2015-05-27 MED ORDER — FLUTICASONE PROPIONATE 50 MCG/ACT NA SUSP: 2.0000 | Freq: Every day | NASAL | Status: DC

## 2015-05-27 MED ORDER — AMOXICILLIN-POT CLAVULANATE 875-125 MG PO TABS: 1.0000 | ORAL_TABLET | Freq: Two times a day (BID) | ORAL | Status: DC

## 2015-05-27 NOTE — Assessment & Plan Note (Signed)
Rx Augmentin.  Increase fluids.  Rest.  Saline nasal spray.  Probiotic.  Mucinex as directed.  Humidifier in bedroom.  Call or return to clinic if symptoms are not improving.  

## 2015-05-27 NOTE — Patient Instructions (Signed)
Please take antibiotic as directed.  Increase fluid intake.  Use Saline nasal spray.  Take a daily multivitamin. Get some Mucinex-D and use as directed. Flonase once daily.  Place a humidifier in the bedroom.  Please call or return clinic if symptoms are not improving.  Sinusitis Sinusitis is redness, soreness, and swelling (inflammation) of the paranasal sinuses. Paranasal sinuses are air pockets within the bones of your face (beneath the eyes, the middle of the forehead, or above the eyes). In healthy paranasal sinuses, mucus is able to drain out, and air is able to circulate through them by way of your nose. However, when your paranasal sinuses are inflamed, mucus and air can become trapped. This can allow bacteria and other germs to grow and cause infection. Sinusitis can develop quickly and last only a short time (acute) or continue over a long period (chronic). Sinusitis that lasts for more than 12 weeks is considered chronic.  CAUSES  Causes of sinusitis include:  Allergies.  Structural abnormalities, such as displacement of the cartilage that separates your nostrils (deviated septum), which can decrease the air flow through your nose and sinuses and affect sinus drainage.  Functional abnormalities, such as when the small hairs (cilia) that line your sinuses and help remove mucus do not work properly or are not present. SYMPTOMS  Symptoms of acute and chronic sinusitis are the same. The primary symptoms are pain and pressure around the affected sinuses. Other symptoms include:  Upper toothache.  Earache.  Headache.  Bad breath.  Decreased sense of smell and taste.  A cough, which worsens when you are lying flat.  Fatigue.  Fever.  Thick drainage from your nose, which often is green and may contain pus (purulent).  Swelling and warmth over the affected sinuses. DIAGNOSIS  Your caregiver will perform a physical exam. During the exam, your caregiver may:  Look in your nose  for signs of abnormal growths in your nostrils (nasal polyps).  Tap over the affected sinus to check for signs of infection.  View the inside of your sinuses (endoscopy) with a special imaging device with a light attached (endoscope), which is inserted into your sinuses. If your caregiver suspects that you have chronic sinusitis, one or more of the following tests may be recommended:  Allergy tests.  Nasal culture A sample of mucus is taken from your nose and sent to a lab and screened for bacteria.  Nasal cytology A sample of mucus is taken from your nose and examined by your caregiver to determine if your sinusitis is related to an allergy. TREATMENT  Most cases of acute sinusitis are related to a viral infection and will resolve on their own within 10 days. Sometimes medicines are prescribed to help relieve symptoms (pain medicine, decongestants, nasal steroid sprays, or saline sprays).  However, for sinusitis related to a bacterial infection, your caregiver will prescribe antibiotic medicines. These are medicines that will help kill the bacteria causing the infection.  Rarely, sinusitis is caused by a fungal infection. In theses cases, your caregiver will prescribe antifungal medicine. For some cases of chronic sinusitis, surgery is needed. Generally, these are cases in which sinusitis recurs more than 3 times per year, despite other treatments. HOME CARE INSTRUCTIONS   Drink plenty of water. Water helps thin the mucus so your sinuses can drain more easily.  Use a humidifier.  Inhale steam 3 to 4 times a day (for example, sit in the bathroom with the shower running).  Apply a warm, moist  washcloth to your face 3 to 4 times a day, or as directed by your caregiver.  Use saline nasal sprays to help moisten and clean your sinuses.  Take over-the-counter or prescription medicines for pain, discomfort, or fever only as directed by your caregiver. SEEK IMMEDIATE MEDICAL CARE IF:  You  have increasing pain or severe headaches.  You have nausea, vomiting, or drowsiness.  You have swelling around your face.  You have vision problems.  You have a stiff neck.  You have difficulty breathing. MAKE SURE YOU:   Understand these instructions.  Will watch your condition.  Will get help right away if you are not doing well or get worse. Document Released: 04/18/2005 Document Revised: 07/11/2011 Document Reviewed: 05/03/2011 [facility] Patient Information 2014 [place], [place].

## 2015-05-27 NOTE — Progress Notes (Signed)
Pre visit review using our clinic review tool, if applicable. No additional management support is needed unless otherwise documented below in the visit note. 

## 2015-05-27 NOTE — Progress Notes (Signed)
   Patient presents to clinic today c/o 1.5 weeks of chest congestion, sinus pressure, sinus pain, ear pain bilaterally and PND. Denies fever, chills or aches. Denies recent travel. Denies history of asthma. Does have seasonal allergies for which she takes a daily Zyrtec.  Past Medical History  Diagnosis Date  . Allergy   . Depression   . Anxiety   . Anal fissure   . External hemorrhoids     Current Outpatient Prescriptions on File Prior to Visit  Medication Sig Dispense Refill  . cetirizine (ZYRTEC) 10 MG tablet Take 10 mg by mouth daily.      . folic acid (FOLVITE) 1 MG tablet Take 1 mg by mouth daily.     No current facility-administered medications on file prior to visit.    No Known Allergies  Family History  Problem Relation Age of Onset  . Protein C deficiency Sister   . Deep vein thrombosis Sister   . Pulmonary embolism Sister   . Colon cancer Neg Hx   . Cancer Neg Hx   . Drug abuse Neg Hx   . Stroke Neg Hx   . Heart disease Paternal Grandmother   . Colitis Mother     Social History   Social History  . Marital Status: Single    Spouse Name: N/A  . Number of Children: 0  . Years of Education: N/A   Occupational History  .     Social History Main Topics  . Smoking status: Former Smoker    Quit date: 05/02/2001  . Smokeless tobacco: Never Used  . Alcohol Use: Yes     Comment: 1-2 beers a day  . Drug Use: No     Comment: 1-2 times daily  . Sexual Activity: Not Asked   Other Topics Concern  . None   Social History Narrative   Review of Systems - See HPI.  All other ROS are negative.  BP 120/74 mmHg  Pulse 95  Temp(Src) 98.3 F (36.8 C) (Oral)  Ht  (1.626 m)  Wt 156 lb (70.761 kg)  BMI 26.76 kg/m2  SpO2 96%  Physical Exam  Constitutional: She is well-developed, well-nourished, and in no distress.  HENT:  Head: Normocephalic and atraumatic.  Right Ear: Tympanic membrane and external ear normal.  Left Ear: External ear normal.  Nose:  Right sinus exhibits maxillary sinus tenderness. Left sinus exhibits maxillary sinus tenderness.  Mouth/Throat: Uvula is midline and oropharynx is clear and moist. No oropharyngeal exudate.  Eyes: Conjunctivae are normal.  Neck: Neck supple.  Cardiovascular: Normal rate, regular rhythm, normal heart sounds and intact distal pulses.   Pulmonary/Chest: Effort normal and breath sounds normal. No respiratory distress. She has no wheezes. She has no rales. She exhibits no tenderness.  Neurological: She is alert.   Assessment/Plan: Acute bacterial sinusitis Rx Augmentin.  Increase fluids.  Rest.  Saline nasal spray.  Probiotic.  Mucinex as directed.  Humidifier in bedroom..  Call or return to clinic if symptoms are not improving.

## 2015-05-29 ENCOUNTER — Telehealth: Payer: Self-pay | Admitting: *Deleted

## 2015-05-29 ENCOUNTER — Encounter: Payer: Self-pay | Admitting: *Deleted

## 2015-05-29 NOTE — Telephone Encounter (Signed)
Pre-Visit Call completed with patient and chart updated.   Pre-Visit Info documented in Specialty Comments under [employer].    

## 2015-06-01 ENCOUNTER — Encounter: Payer: Self-pay | Admitting: Family

## 2015-06-01 ENCOUNTER — Ambulatory Visit (INDEPENDENT_AMBULATORY_CARE_PROVIDER_SITE_OTHER): Payer: 59 | Admitting: Family

## 2015-06-01 VITALS — BP 120/79 | HR 83 | Temp 98.7°F | Resp 16 | Ht 64.0 in | Wt 155.4 lb

## 2015-06-01 DIAGNOSIS — R635 Abnormal weight gain: Secondary | ICD-10-CM | POA: Diagnosis not present

## 2015-06-01 DIAGNOSIS — J019 Acute sinusitis, unspecified: Secondary | ICD-10-CM

## 2015-06-01 DIAGNOSIS — F329 Major depressive disorder, single episode, unspecified: Secondary | ICD-10-CM

## 2015-06-01 DIAGNOSIS — F418 Other specified anxiety disorders: Secondary | ICD-10-CM | POA: Diagnosis not present

## 2015-06-01 DIAGNOSIS — F419 Anxiety disorder, unspecified: Secondary | ICD-10-CM

## 2015-06-01 DIAGNOSIS — K5900 Constipation, unspecified: Secondary | ICD-10-CM | POA: Diagnosis not present

## 2015-06-01 DIAGNOSIS — B9689 Other specified bacterial agents as the cause of diseases classified elsewhere: Secondary | ICD-10-CM

## 2015-06-01 LAB — TSH: TSH: 1.88 u[IU]/mL (ref 0.35–4.50)

## 2015-06-01 NOTE — Progress Notes (Signed)
Pre visit review using our clinic review tool, if applicable. No additional management support is needed unless otherwise documented below in the visit note. 

## 2015-06-01 NOTE — Patient Instructions (Addendum)
Please complete lab work prior to leaving. Add lots of fresh fruits/veggies and exercise along with your good water intake to help with constipation/bloating. Continue to avoid dairy. Schedule a complete physical at your convenience.

## 2015-06-01 NOTE — Progress Notes (Signed)
Subjective:    Patient ID: [name] [name], female    DOB: 02-16-1976, 40 y.o.   MRN: [id number]  HPI  [name] [name] is a 40 yr old female who presents today to establish care.  1) Abdominal bloating- reports that she has not been eating very well.Also not exercising regularly.  She has gained 35 pounds due to lack of exercise. She reports that dairy makes bloating worse- she tries to avoid dairy. Had some lower abdominal cramping last week but she attributed to abx.  Now improved.  Denies nausea.    2) Depression/Anxiety- she is maintained on zoloft  daily. Reports that feels good on current dose.  She is considering pregnancy. She has discussed with Dr. Estanislado Pandy who advised her to continue. She sees Vear Clock NP for psychiatry.    3) Sinusitis- reports symptoms are improving.  She continues augmentin.   Review of Systems See HPI  Past Medical History  Diagnosis Date  . Allergy   . Depression   . Anxiety   . Anal fissure   . External hemorrhoids     Social History   Social History  . Marital Status: Single    Spouse Name: N/A  . Number of Children: 0  . Years of Education: N/A   Occupational History  .     Social History Main Topics  . Smoking status: Former Smoker    Quit date: 05/02/2001  . Smokeless tobacco: Never Used  . Alcohol Use: Yes     Comment: 1-2 beers a day  . Drug Use: No     Comment: 1-2 times daily  . Sexual Activity: Not on file   Other Topics Concern  . Not on file   Social History Narrative    Past Surgical History  Procedure Laterality Date  . Wisdom tooth extraction    . Colonoscopy N/A 12/14/2012    Procedure: COLONOSCOPY;  Surgeon: Hart Carwin, MD;  Location: [facility];  Service: Endoscopy;  Laterality: N/A;    Family History  Problem Relation Age of Onset  . Protein C deficiency Sister   . Deep vein thrombosis Sister   . Pulmonary embolism Sister   . Colon cancer Neg Hx   . Cancer Neg Hx   . Drug abuse Neg Hx   .  Stroke Neg Hx   . Heart disease Paternal Grandmother   . Colitis Mother   . Basal cell carcinoma Mother     No Known Allergies  Current Outpatient Prescriptions on File Prior to Visit  Medication Sig Dispense Refill  . amoxicillin-clavulanate (AUGMENTIN) 875-125 MG tablet Take 1 tablet by mouth 2 (two) times daily. 14 tablet 0  . cetirizine (ZYRTEC) 10 MG tablet Take 10 mg by mouth daily.      . fluticasone (FLONASE) 50 MCG/ACT nasal spray Place 2 sprays into both nostrils daily. 16 g 6  . folic acid (FOLVITE) 1 MG tablet Take 1 mg by mouth daily.    . sertraline (ZOLOFT) 100 MG tablet Take 100 mg by mouth 2 (two) times daily.    . traZODone (DESYREL) 50 MG tablet Take 1 to 2 at night  0   No current facility-administered medications on file prior to visit.    BP 120/79 mmHg  Pulse 83  Temp(Src) 98.7 F (37.1 C) (Oral)  Resp 16  Ht  (1.626 m)  Wt 155 lb 6.4 oz (70.489 kg)  BMI 26.66 kg/m2  SpO2 100%  LMP 05/10/2015  Objective:   Physical Exam  Constitutional: She is oriented to person, place, and time. She appears well-developed and well-nourished.  HENT:  Head: Normocephalic and atraumatic.  Right Ear: Tympanic membrane and ear canal normal.  Left Ear: Tympanic membrane and ear canal normal.  Mouth/Throat: No oropharyngeal exudate, posterior oropharyngeal edema or posterior oropharyngeal erythema.  Cardiovascular: Normal rate, regular rhythm and normal heart sounds.   No murmur heard. Pulmonary/Chest: Effort normal and breath sounds normal. No respiratory distress. She has no wheezes.  Abdominal: Soft. She exhibits no distension. There is no tenderness. There is no rebound.  Musculoskeletal: She exhibits no edema.  Lymphadenopathy:    She has no cervical adenopathy.  Neurological: She is alert and oriented to person, place, and time.  Skin: Skin is warm and dry.  Psychiatric: She has a normal mood and affect. Her behavior is normal. Judgment and thought  content normal.          Assessment & Plan:

## 2015-06-02 DIAGNOSIS — K5900 Constipation, unspecified: Secondary | ICD-10-CM | POA: Insufficient documentation

## 2015-06-02 NOTE — Assessment & Plan Note (Signed)
sxs most consistent with mild constipation/IBS sxs.  Advised pt as follows:  Add lots of fresh fruits/veggies and exercise along with your good water intake to help with constipation/bloating. Continue to avoid dairy.

## 2015-06-02 NOTE — Assessment & Plan Note (Addendum)
Stable on zoloft, followed by psychiatry, Vear Clock NP

## 2015-06-02 NOTE — Assessment & Plan Note (Signed)
Clinically improving, complete augmentin.

## 2015-09-22 LAB — HM PAP SMEAR
HM Pap smear: NEGATIVE
HM Pap smear: NEGATIVE

## 2016-01-22 ENCOUNTER — Ambulatory Visit: Payer: Self-pay | Admitting: Family

## 2016-03-23 ENCOUNTER — Encounter: Payer: Self-pay | Admitting: Podiatry

## 2016-03-23 ENCOUNTER — Ambulatory Visit (INDEPENDENT_AMBULATORY_CARE_PROVIDER_SITE_OTHER): Payer: 59

## 2016-03-23 ENCOUNTER — Ambulatory Visit (INDEPENDENT_AMBULATORY_CARE_PROVIDER_SITE_OTHER): Payer: 59 | Admitting: Podiatry

## 2016-03-23 VITALS — BP 111/74 | HR 76 | Resp 16

## 2016-03-23 DIAGNOSIS — G5762 Lesion of plantar nerve, left lower limb: Secondary | ICD-10-CM

## 2016-03-23 DIAGNOSIS — M779 Enthesopathy, unspecified: Secondary | ICD-10-CM | POA: Diagnosis not present

## 2016-03-23 DIAGNOSIS — M79671 Pain in right foot: Secondary | ICD-10-CM | POA: Diagnosis not present

## 2016-03-23 DIAGNOSIS — G5782 Other specified mononeuropathies of left lower limb: Secondary | ICD-10-CM

## 2016-03-23 DIAGNOSIS — M79672 Pain in left foot: Secondary | ICD-10-CM | POA: Diagnosis not present

## 2016-03-23 MED ORDER — DICLOFENAC SODIUM 75 MG PO TBEC: 75.0000 mg | DELAYED_RELEASE_TABLET | Freq: Two times a day (BID) | ORAL | 2 refills | Status: DC

## 2016-03-23 NOTE — Progress Notes (Signed)
Subjective:     Patient ID: [name] [name], female   DOB: [birth year], 40 y.o.   MRN: [id number]  HPI patient presents stating she's been getting shooting pains in her left foot between third and fourth toes and her right foot she jammed her toes and she gets pain when she tries to bend them. States both feet a been bothering her with the left being approximately 2 months and the right being approximately 1 month   Review of Systems     Objective:   Physical Exam Neurovascular status intact muscle strength adequate with inflammation and shooting pain between the third and fourth toe left foot with radiating-like discomfort. Patient's found on the right foot have inflammation around the third and fourth metatarsophalangeal joints of mild to moderate nature    Assessment:       neuroma symptomatology left with pinching along with inflammatory capsulitis low grade right    Plan:     H&P both conditions reviewed x-rays reviewed. Today I did a neuro lysis injection of a purified alcohol Marcaine solution left third interspace 1.5 mL and on the right foot I applied padding and placed on diclofenac 75 mg twice a day  X-ray report indicates no signs of stress fracture or advanced arthritis of the forefoot bilateral

## 2016-04-06 ENCOUNTER — Ambulatory Visit (INDEPENDENT_AMBULATORY_CARE_PROVIDER_SITE_OTHER): Payer: 59 | Admitting: Podiatry

## 2016-04-06 ENCOUNTER — Encounter: Payer: Self-pay | Admitting: Podiatry

## 2016-04-06 DIAGNOSIS — G5782 Other specified mononeuropathies of left lower limb: Secondary | ICD-10-CM

## 2016-04-06 DIAGNOSIS — G5762 Lesion of plantar nerve, left lower limb: Secondary | ICD-10-CM

## 2016-04-06 DIAGNOSIS — B351 Tinea unguium: Secondary | ICD-10-CM

## 2016-04-07 NOTE — Progress Notes (Signed)
Subjective:     Patient ID: [name] [name], female   DOB: [birth year], 40 y.o.   MRN: [id number]  HPI patient states I'm still getting the burning pain and I'm also concerned about the condition of my nail   Review of Systems     Objective:   Physical Exam Neurovascular status intact with patient found have burning shooting pain third interspace left with mild radiating discomforts with mild to moderate improvement from previous visit and yellow nail distal righ    Assessment:     H&P condition reviewed and focusing on the nerve did a sterile prep and injected directly into the nerve root prior to breaking into digital branches with a careful 5 Marcaine alcohol solution 1.3 mL. For the nail I discussed trimming but do not recommend aggressive treatment    Plan:     Reviewed conditions and discussed continued neuroma symptomatology and mycotic nail infection with patient

## 2016-04-27 ENCOUNTER — Ambulatory Visit: Payer: 59 | Admitting: Podiatry

## 2016-04-28 ENCOUNTER — Ambulatory Visit (INDEPENDENT_AMBULATORY_CARE_PROVIDER_SITE_OTHER): Payer: 59 | Admitting: Podiatry

## 2016-04-28 ENCOUNTER — Encounter: Payer: Self-pay | Admitting: Podiatry

## 2016-04-28 DIAGNOSIS — G5762 Lesion of plantar nerve, left lower limb: Secondary | ICD-10-CM | POA: Diagnosis not present

## 2016-04-28 DIAGNOSIS — G5782 Other specified mononeuropathies of left lower limb: Secondary | ICD-10-CM

## 2016-04-28 NOTE — Progress Notes (Signed)
Subjective:     Patient ID: [name] [name], female   DOB: 1976-01-23, 40 y.o.   MRN: [id number]  HPI patient states I seem to be improved but I'm still getting some shooting pain   Review of Systems     Objective:   Physical Exam Neurovascular status intact with radiating discomfort still noted third interspace left with improvement but still present upon palpation    Assessment:     Neuroma symptomatology left improved but present    Plan:     Recommended injection treatment and discussed neuro lysis injection procedure and risk. Today I went ahead and I did a sterile prep and then injected directly into the nerve prior to breaking into digital branches with a purified alcohol Marcaine solution

## 2016-06-03 ENCOUNTER — Other Ambulatory Visit: Payer: Self-pay | Admitting: Podiatry

## 2016-06-03 NOTE — Telephone Encounter (Signed)
Pt needs an appt if continuing to have problems. 

## 2016-06-20 ENCOUNTER — Encounter: Payer: Self-pay | Admitting: Medical

## 2016-06-20 ENCOUNTER — Ambulatory Visit (HOSPITAL_BASED_OUTPATIENT_CLINIC_OR_DEPARTMENT_OTHER)
Admission: RE | Admit: 2016-06-20 | Discharge: 2016-06-20 | Disposition: A | Discharge status: Home / Self Care | Payer: 59 | Source: Ambulatory Visit | Attending: Medical | Admitting: Medical

## 2016-06-20 ENCOUNTER — Ambulatory Visit (INDEPENDENT_AMBULATORY_CARE_PROVIDER_SITE_OTHER): Payer: 59 | Admitting: Medical

## 2016-06-20 VITALS — BP 116/70 | HR 79 | Temp 97.5°F | Resp 16 | Ht 64.0 in | Wt 145.5 lb

## 2016-06-20 DIAGNOSIS — R51 Headache: Secondary | ICD-10-CM

## 2016-06-20 DIAGNOSIS — M79605 Pain in left leg: Secondary | ICD-10-CM | POA: Diagnosis not present

## 2016-06-20 DIAGNOSIS — M5136 Other intervertebral disc degeneration, lumbar region: Secondary | ICD-10-CM | POA: Insufficient documentation

## 2016-06-20 DIAGNOSIS — H5713 Ocular pain, bilateral: Secondary | ICD-10-CM

## 2016-06-20 DIAGNOSIS — M25552 Pain in left hip: Secondary | ICD-10-CM | POA: Diagnosis present

## 2016-06-20 DIAGNOSIS — M5442 Lumbago with sciatica, left side: Secondary | ICD-10-CM | POA: Diagnosis not present

## 2016-06-20 DIAGNOSIS — H538 Other visual disturbances: Secondary | ICD-10-CM

## 2016-06-20 DIAGNOSIS — R519 Headache, unspecified: Secondary | ICD-10-CM

## 2016-06-20 MED ORDER — CYCLOBENZAPRINE HCL 5 MG PO TABS
ORAL_TABLET | ORAL | 0 refills | Status: DC
Start: 2016-06-20 — End: 2017-02-15

## 2016-06-20 MED ORDER — MELOXICAM 7.5 MG PO TABS: ORAL_TABLET | ORAL | 0 refills | Status: DC

## 2016-06-20 NOTE — Progress Notes (Signed)
Subjective:    Patient ID: [name] [name], female    DOB: [birth year], 41 y.o.   MRN: [id number]  HPI  Pt in with some pain in her lower back history and some hip area pain. Pt points to her left  upper po hamstring area and lower back as the source of pain. Left side is worse than rt side. Pt sits a lot at work daily. Pt states several months since this pain has been present in her left upper hamstring area. Hurts mostly sitting. Dull ache mostly in her hamstring area.   No radiating pain to her left foot but some hx of left foot pain.  Pt has been trying alleve, and diclofenac.  LMP-06-12-2016. Normal cycle and came when she expected it to.  Pt used to exercise a lot more in the past.     Review of Systems  Constitutional: Negative for chills and fatigue.  Respiratory: Negative for cough, chest tightness, shortness of breath and wheezing.   Cardiovascular: Negative for chest pain and palpitations.  Gastrointestinal: Negative for abdominal pain.  Musculoskeletal: Negative for back pain.  Skin: Negative for rash.  Neurological: Positive for headaches. Negative for dizziness, tremors, seizures, weakness and numbness.       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 Some neck pain at end of the day.  Pt thinks vision little off. She wants to see optometrist to check her vision.  Hematological: Negative for adenopathy. Does not bruise/bleed easily.  Psychiatric/Behavioral: Negative for behavioral problems, confusion, self-injury and sleep disturbance.    Past Medical History:  Diagnosis Date  . Allergy   . Anal fissure   . Anxiety   . Depression   . External hemorrhoids      Social History   Social History  . Marital status: Single    Spouse name: N/A  . Number of children: 0  . Years of education: N/A   Occupational History  .  [employer]   Social History Main Topics  . Smoking status: Former Smoker   Quit date: 05/02/2001  . Smokeless tobacco: Never Used  . Alcohol use Yes     Comment: 1-2 beers a day  . Drug use: No     Comment: 1-2 times daily  . Sexual activity: Not on file   Other Topics Concern  . Not on file   Social History Narrative  . No narrative on file    Past Surgical History:  Procedure Laterality Date  . COLONOSCOPY N/A 12/14/2012   Procedure: COLONOSCOPY;  Surgeon: Hart Carwinora M Brodie, MD;  Location: [facility];  Service: Endoscopy;  Laterality: N/A;  . WISDOM TOOTH EXTRACTION      Family History  Problem Relation Age of Onset  . Protein C deficiency Sister   . Deep vein thrombosis Sister   . Pulmonary embolism Sister   . Heart disease Paternal Grandmother   . Colitis Mother   . Basal cell carcinoma Mother   . Colon cancer Neg Hx   . Cancer Neg Hx   . Drug abuse Neg Hx   . Stroke Neg Hx     No Known Allergies  Current Outpatient Prescriptions on File Prior to Visit  Medication Sig Dispense Refill  . cetirizine (ZYRTEC) 10 MG tablet Take 10 mg by mouth daily.      . Efinaconazole (JUBLIA) 10 % SOLN Apply 1 application topically daily.    .Marland Kitchen  fluticasone (FLONASE) 50 MCG/ACT nasal spray Place 2 sprays into both nostrils daily. 16 g 6  . folic acid (FOLVITE) 1 MG tablet Take 1 mg by mouth daily.    Marland Kitchen ketoconazole (NIZORAL) 2 % shampoo Apply 1 application topically 2 (two) times a week.    . sertraline (ZOLOFT) 100 MG tablet Take 100 mg by mouth 2 (two) times daily.    Marland Kitchen triamcinolone (KENALOG) 0.147 MG/GM topical spray Apply topically daily. To dry scalp    . diclofenac (VOLTAREN) 75 MG EC tablet TAKE 1 TABLET (75 MG TOTAL) BY MOUTH 2 (TWO) TIMES DAILY. (Patient not taking: Reported on 06/20/2016) 24 tablet 2   No current facility-administered medications on file prior to visit.     BP 116/70 (BP Location: Left Arm, Patient Position: Sitting, Cuff Size: Normal)   Pulse 79   Temp 97.5 F (36.4 C) (Oral)   Resp 16   Ht 5\' 4"  (1.626 m)   Wt 145 lb 8 oz  (66 kg)   LMP 06/12/2016   SpO2 99%   BMI 24.98 kg/m       Objective:   Physical Exam   General Mental Status- Alert. General Appearance- Not in acute distress.   Skin General: Color- Normal Color. Moisture- Normal Moisture.  Neck Carotid Arteries- Normal color. Moisture- Normal Moisture. No carotid bruits. No JVD.  Chest and Lung Exam Auscultation: Breath Sounds:-Normal.  Cardiovascular Auscultation:Rythm- Regular. Murmurs & Other Heart Sounds:Auscultation of the heart reveals- No Murmurs.  Abdomen Inspection:-Inspeection Normal. Palpation/Percussion:Note:No mass. Palpation and Percussion of the abdomen reveal- Non Tender, Non Distended + BS, no rebound or guarding.    Neurologic Cranial Nerve exam:- CN III-XII intact(No nystagmus), symmetric smile. Drift Test:- No drift. Strength:- 5/5 equal and symmetric strength both upper and lower extremities.  Back- Faint lumbar and Lt si area tenderness. On straight leg lift bilateral hip pain L5-S1 sensation intact.  Lt hamstring mild tenderness to palpation junction of left lower buttox area.     Assessment & Plan:  For your back, hip, and hamstring area pain described rx mobic and flexeril. Stop diclofenac or other otc nsaids.  Please get xray of your lower back and hips today. Referral to sports med placed.  For your possible blurred vision late in day and eye pain referral to optometrist placed.  For possible tension HA late in the day will see if flexeril decreases the HA. If ever HA with neurologic type signs or symptoms then ED evaluation.  Follow up in 10-14 days pcp or as needed  Daryana Whirley, [place], [employer]

## 2016-06-20 NOTE — Patient Instructions (Addendum)
For your back, hip, and hamstring area pain described rx mobic and flexeril. Stop diclofenac or other otc nsaids.  Please get xray of your lower back and hips today. Referral to sports med placed.  For your possible blurred vision late in day and eye pain referral to optometrist placed.  For possible tension HA late in the day will see if flexeril decreases the HA. If ever HA with neurologic type signs or symptoms then ED evaluation.  Follow up in 10-14 days pcp or as needed

## 2016-06-21 ENCOUNTER — Encounter: Payer: Self-pay | Admitting: Family Medicine

## 2016-06-21 ENCOUNTER — Ambulatory Visit (INDEPENDENT_AMBULATORY_CARE_PROVIDER_SITE_OTHER): Payer: 59 | Admitting: Family Medicine

## 2016-06-21 DIAGNOSIS — M5442 Lumbago with sciatica, left side: Secondary | ICD-10-CM | POA: Diagnosis not present

## 2016-06-21 DIAGNOSIS — G8929 Other chronic pain: Secondary | ICD-10-CM | POA: Diagnosis not present

## 2016-06-21 DIAGNOSIS — M5441 Lumbago with sciatica, right side: Secondary | ICD-10-CM

## 2016-06-21 NOTE — Patient Instructions (Signed)
You have piriformis syndrome with sciatica. Given how long you've had this without notable improvement we will go ahead with MRI of your lumbar spine to assess for a central disc herniation that would mimic this. Try to avoid painful activities when possible. Tennis ball to massage area when sitting Pick 2-3 stretches where you feel the pull in the area of pain - do 3 of these and hold for 20-30 seconds at least once a day Standing hip rotations, hip side raises 3 sets of 10 once a day. Add ankle with if this becomes too easy. Meloxicam 15mg  daily with food for pain and inflammation as needed. Consider physical therapy depending on MRI results.

## 2016-06-23 NOTE — Assessment & Plan Note (Signed)
Patient has done extensive conservative treatment over the years for low back, piriformis syndrome including PT, chiropractic care, nsaids, muscle relaxants and not responded as would expect.  We again reviewed home exercises and stretches for piriformis syndrome, sciatica but will go ahead with MRI to assess for central disc herniation.  I independently reviewed radiographs and very mild DDD at L4-5 - unlikely this alone causing her symptoms.  Discussed meloxicam, tennis ball massage, shown strengthening exercises as well.

## 2016-06-23 NOTE — Progress Notes (Addendum)
PCP: Lemont Fillers'SULLIVAN,MELISSA S., NP Consultation requested by: Esperanza RichtersEdward Saguier PA-C  Subjective:   HPI: Patient is a 41 y.o. female here for low back pain.  Patient reports she's had several years of low back pain dating back to when she was a teenager. No acute injury or trauma. 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 Difficulty lying on the right side. Associated numbness into legs. Pain into both legs also at 3/10 level, achy;  Pain in back is 5/10 level. Better with movement and driving. Has tried physical therapy remotely, chiropractic care in the past, mobic, flexeril. No bowel/bladder dysfunction.  Past Medical History:  Diagnosis Date  . Allergy   . Anal fissure   . Anxiety   . Depression   . External hemorrhoids     Current Outpatient Prescriptions on File Prior to Visit  Medication Sig Dispense Refill  . cetirizine (ZYRTEC) 10 MG tablet Take 10 mg by mouth daily.      . cyclobenzaprine (FLEXERIL) 5 MG tablet 1 tab po q hs 10 tablet 0  . Efinaconazole (JUBLIA) 10 % SOLN Apply 1 application topically daily.    . fluticasone (FLONASE) 50 MCG/ACT nasal spray Place 2 sprays into both nostrils daily. 16 g 6  . folic acid (FOLVITE) 1 MG tablet Take 1 mg by mouth daily.    Marland Kitchen. ketoconazole (NIZORAL) 2 % shampoo Apply 1 application topically 2 (two) times a week.    . meloxicam (MOBIC) 7.5 MG tablet 1-2 tab po q day 30 tablet 0  . sertraline (ZOLOFT) 100 MG tablet Take 100 mg by mouth 2 (two) times daily.    . traZODone (DESYREL) 100 MG tablet Take 100 mg by mouth at bedtime.    . triamcinolone (KENALOG) 0.147 MG/GM topical spray Apply topically daily. To dry scalp     No current facility-administered medications on file prior to visit.     Past Surgical History:  Procedure Laterality Date  . COLONOSCOPY N/A 12/14/2012   Procedure: COLONOSCOPY;  Surgeon: Hart Carwinora M Brodie, MD;  Location: [facility];  Service: Endoscopy;  Laterality: N/A;  . WISDOM TOOTH EXTRACTION       No Known Allergies  Social History   Social History  . Marital status: Single    Spouse name: N/A  . Number of children: 0  . Years of education: N/A   Occupational History  .  [employer]   Social History Main Topics  . Smoking status: Former Smoker    Quit date: 05/02/2001  . Smokeless tobacco: Never Used  . Alcohol use Yes     Comment: 1-2 beers a day  . Drug use: No     Comment: 1-2 times daily  . Sexual activity: Not on file   Other Topics Concern  . Not on file   Social History Narrative  . No narrative on file    Family History  Problem Relation Age of Onset  . Protein C deficiency Sister   . Deep vein thrombosis Sister   . Pulmonary embolism Sister   . Heart disease Paternal Grandmother   . Colitis Mother   . Basal cell carcinoma Mother   . Colon cancer Neg Hx   . Cancer Neg Hx   . Drug abuse Neg Hx   . Stroke Neg Hx     BP 112/77   Pulse 86   Ht 5\' 4"  (1.626 m)   Wt 141 lb (64 kg)   LMP 06/12/2016   BMI  24.20 kg/m   Review of Systems: See HPI above.     Objective:  Physical Exam:  Gen: NAD, comfortable in exam room  Back/hips: No gross deformity, scoliosis. TTP bilateral piriformis.  No midline or bony TTP. FROM with pain on flexion. Strength LEs 5/5 all muscle groups.   3+ MSRs in patellar and 2+ achilles tendons, equal bilaterally. Negative SLRs. Sensation intact to light touch bilaterally. Negative logroll bilateral hips Negative fabers and mildly positive piriformis stretches bilaterally left worse than right.   Assessment & Plan:  1. Low back/hip pain - Patient has done extensive conservative treatment over the years for low back, piriformis syndrome including PT, chiropractic care, nsaids, muscle relaxants and not responded as would expect.  We again reviewed home exercises and stretches for piriformis syndrome, sciatica but will go ahead with MRI to assess for central disc herniation.  I independently reviewed radiographs and  very mild DDD at L4-5 - unlikely this alone causing her symptoms.  Discussed meloxicam, tennis ball massage, shown strengthening exercises as well.  Addendum:  MRI reviewed and discussed with patient.  She does have herniations at L4-5 and L5-S1 bilaterally.  Suspect L5-S1 the more likely level causing her pain.  We discussed options - will go ahead with ESIs and physical therapy.

## 2016-06-24 ENCOUNTER — Ambulatory Visit: Payer: 59 | Admitting: Family Medicine

## 2016-06-24 NOTE — Addendum Note (Signed)
Addended by: Kathi Simpers F on: 06/24/2016 09:18 AM   Modules accepted: Orders

## 2016-07-02 ENCOUNTER — Ambulatory Visit (HOSPITAL_BASED_OUTPATIENT_CLINIC_OR_DEPARTMENT_OTHER)
Admission: RE | Admit: 2016-07-02 | Discharge: 2016-07-02 | Disposition: A | Discharge status: Home / Self Care | Payer: 59 | Source: Ambulatory Visit | Attending: Family Medicine | Admitting: Family Medicine

## 2016-07-02 ENCOUNTER — Other Ambulatory Visit: Payer: Self-pay | Admitting: Medical

## 2016-07-02 DIAGNOSIS — G8929 Other chronic pain: Secondary | ICD-10-CM | POA: Diagnosis not present

## 2016-07-02 DIAGNOSIS — M5441 Lumbago with sciatica, right side: Secondary | ICD-10-CM | POA: Insufficient documentation

## 2016-07-02 DIAGNOSIS — M5442 Lumbago with sciatica, left side: Secondary | ICD-10-CM | POA: Diagnosis not present

## 2016-07-05 NOTE — Addendum Note (Signed)
Addended by: Kathi SimpersWISE, Chianne Byrns F on: 07/05/2016 04:44 PM   Modules accepted: Orders

## 2016-07-07 ENCOUNTER — Telehealth: Payer: Self-pay | Admitting: *Deleted

## 2016-07-07 ENCOUNTER — Telehealth: Payer: Self-pay | Admitting: Family Medicine

## 2016-07-07 MED ORDER — METHOCARBAMOL 500 MG PO TABS: 500.0000 mg | ORAL_TABLET | Freq: Three times a day (TID) | ORAL | 1 refills | Status: DC | PRN

## 2016-07-07 NOTE — Telephone Encounter (Signed)
Sent in Robaxin

## 2016-07-07 NOTE — Telephone Encounter (Signed)
Pt called in wanting meds for her pain. She states that her PCP gave her Meloxicam and she has ran out. I advise her if she wanted refills on Meloxicam she would need to go to her pcp. She okayd, but still wanted me to send message to dr.

## 2016-07-07 NOTE — Telephone Encounter (Signed)
She's tried a lot of things in the past including flexeril but she could also do a prednisone dose pack, different muscle relaxant, and/or short course of pain medicine.

## 2016-07-07 NOTE — Telephone Encounter (Signed)
Spoke to patient and she would like to try a different muscle relaxant and a short course of pain medication. Will pick up the Rx for the pain meds tomorrow and [facility] [place]

## 2016-07-08 ENCOUNTER — Other Ambulatory Visit: Payer: Self-pay | Admitting: Family Medicine

## 2016-07-08 DIAGNOSIS — M5416 Radiculopathy, lumbar region: Secondary | ICD-10-CM

## 2016-07-08 MED ORDER — HYDROCODONE-ACETAMINOPHEN 5-325 MG PO TABS: 1.0000 | ORAL_TABLET | Freq: Four times a day (QID) | ORAL | 0 refills | Status: DC | PRN

## 2016-07-08 NOTE — Addendum Note (Signed)
Addended by: Lenda KelpHUDNALL, SHANE R on: 07/08/2016 11:43 AM   Modules accepted: Orders

## 2016-07-08 NOTE — Telephone Encounter (Signed)
Renae Fickleaul with [facility] on [address] Ph# [phone]  Called to follow up on refill request for pt. Please send in order.

## 2016-07-08 NOTE — Telephone Encounter (Signed)
Refill sent per [facility] refill protocol/SLS  

## 2016-07-11 ENCOUNTER — Ambulatory Visit: Payer: 59 | Attending: Family Medicine | Admitting: Physical Therapy

## 2016-07-11 ENCOUNTER — Inpatient Hospital Stay
Admission: RE | Admit: 2016-07-11 | Discharge: 2016-07-11 | Disposition: A | Discharge status: Home / Self Care | Payer: 59 | Source: Ambulatory Visit | Attending: Family Medicine | Admitting: Family Medicine

## 2016-07-11 DIAGNOSIS — G8929 Other chronic pain: Secondary | ICD-10-CM

## 2016-07-11 DIAGNOSIS — M5441 Lumbago with sciatica, right side: Secondary | ICD-10-CM | POA: Insufficient documentation

## 2016-07-11 DIAGNOSIS — R293 Abnormal posture: Secondary | ICD-10-CM | POA: Diagnosis present

## 2016-07-11 DIAGNOSIS — M5442 Lumbago with sciatica, left side: Secondary | ICD-10-CM | POA: Insufficient documentation

## 2016-07-11 NOTE — Patient Instructions (Addendum)

## 2016-07-11 NOTE — Therapy (Signed)
[facility] [address]  [address] [place], [place], [address] Phone: [phone]   Fax:  [phone]  Physical Therapy Evaluation  Patient Details  Name: [name] [name] MRN: [id number] Date of Birth: 22-Sep-1975 Referring Provider: Norton BlizzardShane Hudnall, MD  Encounter Date: 07/11/2016      PT End of Session - 07/11/16 0847    Visit Number 1   Number of Visits 12   Date for PT Re-Evaluation 08/26/16   Authorization Type UHC   Authorization - Number of Visits 40   PT Start Time 0847   PT Stop Time 0932   PT Time Calculation (min) 45 min   Activity Tolerance Patient tolerated treatment well   Behavior [facility] Therapy [facility] for tasks assessed/performed      Past Medical History:  Diagnosis Date  . Allergy   . Anal fissure   . Anxiety   . Depression   . External hemorrhoids     Past Surgical History:  Procedure Laterality Date  . COLONOSCOPY N/A 12/14/2012   Procedure: COLONOSCOPY;  Surgeon: Hart Carwinora M Brodie, MD;  Location: [facility];  Service: Endoscopy;  Laterality: N/A;  . WISDOM TOOTH EXTRACTION      There were no vitals filed for this visit.       Subjective Assessment - 07/11/16 0852    Subjective Pt reports h/o LBP dating back to when she was a teenager. Pain has gotten worse as she has gotten older. Pain worst with standing. Has tried chirpractor, TENS and heat w/o much relief. Recent MRI revealed 2 bulging discs. Pain now in her legs and makes sitting very uncomfortable.   Limitations Sitting;Standing   How long can you sit comfortably? not comfortable for any length of time d/t LE pain   How long can you stand comfortably? 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   Patient Stated Goals "Get rid of the dull ache and  strengthen my core. Sleep better."   Currently in Pain? Yes   Pain Score 4   least 1/10, avg 3-4/10, worst 5-6/10   Pain Location Back   Pain Orientation Lower   Pain Descriptors / Indicators Sharp;Throbbing;Aching   Pain Radiating Towards 2/10 throbbing, aching pain into B LE, L > R (R LE to knee, L LE to foot); numbness in hip with sleeping or prolonged sitting   Pain Onset More than a month ago  exacerbating over past year   Pain Frequency Constant   Aggravating Factors  sitting or standing for any length of time, lifting   Effect of Pain on Daily Activities has to sleep on back d/t pain in hips; cannot lay flat on back (needs pillow under knees); limits sleeping tolerance; affecting focus on job; no tolerance for driving            [facility] - 07/11/16 0847      Assessment   Medical Diagnosis Chronic bilateral LBP with B sciatica   Referring Provider Norton BlizzardShane Hudnall, MD   Onset Date/Surgical Date --  exacerbating over the past year   Next MD Visit none scheduled   Prior Therapy possible therapy >10 yrs ago     Balance Screen   Has the patient fallen in the past 6 months Yes   How many times? 1  Has the patient had a decrease in activity level because of a fear of falling?  No   Is the patient reluctant to leave their home because of a fear of falling?  No     Home Environment   Living Environment Private residence   Type of Home House   Home Access Stairs to enter   Entrance Stairs-Number of Steps 1   Home Layout One level     Prior Function   Level of Independence Independent   Vocation Full time employment   Vocation Requirements mostly deskwork   Leisure shopping, no regular activity     Observation/Other Assessments   Focus on Therapeutic Outcomes (FOTO)  Lumbar spine 54% (46% limitation); predicted 61% (39% limitation)     ROM / Strength   AROM / PROM / Strength AROM;Strength     AROM   AROM Assessment Site Lumbar   Lumbar Flexion WNL    Lumbar Extension WFL   Lumbar - Right Side Bend hand to lateral knee   Lumbar - Left Side Bend hand to lateral knee   Lumbar - Right Rotation 80%    Lumbar - Left Rotation WNL     Strength   Strength Assessment Site Hip;Knee   Right/Left Hip Right;Left   Right Hip Flexion 3+/5   Right Hip Extension 4-/5   Right Hip External Rotation  3+/5   Right Hip Internal Rotation 3+/5   Right Hip ABduction 4-/5   Right Hip ADduction 4-/5   Left Hip Flexion 3+/5   Left Hip Extension 4-/5   Left Hip External Rotation 3+/5   Left Hip Internal Rotation 3+/5   Left Hip ABduction 4-/5   Left Hip ADduction 4-/5   Right/Left Knee Right;Left   Right Knee Flexion 4/5   Right Knee Extension 4+/5   Left Knee Flexion 4/5   Left Knee Extension 4+/5     Flexibility   Soft Tissue Assessment /Muscle Length yes   Hamstrings very mild tightness B   Quadriceps tight RF & hip flexor B   ITB WFL   Piriformis mildly tight B                           PT Education - 07/11/16 0932    Education provided Yes   Education Details PT eval findings, POC, posture and body mechanics educational handout & initial HEP   Person(s) Educated Patient   Methods Explanation;Demonstration;Handout   Comprehension Verbalized understanding;Returned demonstration;Need further instruction          PT Short Term Goals - 07/11/16 0932      PT SHORT TERM GOAL #1   Title Independent with initial HEP by 07/25/16   Status New     PT SHORT TERM GOAL #2   Title Pt will verbalize understanding of neutral spine posture and proper body mechanics to reduce lumbar strain during daily tasks by 07/25/16   Status New           PT Long Term Goals - 07/11/16 0932      PT LONG TERM GOAL #1   Title Independent with advanced HEP by 08/26/16   Status New     PT LONG TERM GOAL #2   Title B hip and knee strength >/= 4/5 by 08/26/16   Status New     PT LONG TERM GOAL #3   Title Pt will tolerate sitting at her  desk for >/= 30 minutes w/o limitation  d/t LBP or radicular pain by 08/26/16   Status New     PT LONG TERM GOAL #4   Title Pt will report able to sleep >/= 4 hrs w/o limitations due to LBP or leg pain by 08/26/16   Status New               Plan - 07/11/16 0932    Clinical Impression Statement [name] [name] is a 41 y/o female who presents to OP PT with chronic bilateral LBP with bilateral sciatica dating back to her teenage years but exacerbated over the past year with sciatica becoming more pronounced. Recent lumbar MRI indicates disc herniations at L4-L5 and L5-S1 with resultant bilateral narrowing of the lateral recesses, which could cause bilateral L5 and/or S1 radiculopathy. Pt states pain is constant and limits tolerance for all positions; with lying limited by hip pain, sitting limited by leg pain and standing limited by back pain. Assessment revealed lumbar ROM essentially WFL with mild limitation in hamstring and hamstring flexibility, and moderate tightness in B hip flexors/RF. Strength moderately impaired in B LEs proximally > distally. Increased muscle tension noted in B lumbar paraspinals but only mild ttp reported, primarily at SIJ level. Pt will benefit from skilled PT to focus on postural and body mechanics education to reduce lumbar strain with daily activities, increasing LE flexibility, core/lumbar stabilization and LE strengthening, along with manual therapy and modalities PRN for pain management. May consider mechanical traction if radicular symptoms persist and/or dry needling for pain/increased muscle tension.   Rehab Potential Good   PT Frequency 2x / week   PT Duration 6 weeks   PT Treatment/Interventions Patient/family education;Neuromuscular re-education;Therapeutic exercise;Therapeutic activities;Manual techniques;Dry needling;Taping;Electrical Stimulation;Moist Heat;Ultrasound;Cryotherapy;Traction;ADLs/Self Care Home Management   PT Next Visit Plan Review posture & body  mechanics education & initial HEP; Lumbar/core stabilization & proximal LE strengthening; Manual therapy & modalities PRN   Consulted and Agree with Plan of Care Patient      Patient will benefit from skilled therapeutic intervention in order to improve the following deficits and impairments:  Pain, Increased muscle spasms, Impaired flexibility, Postural dysfunction, Improper body mechanics, Decreased strength, Decreased activity tolerance  Visit Diagnosis: Chronic bilateral low back pain with bilateral sciatica  Abnormal posture     Problem List Patient Active Problem List   Diagnosis Date Noted  . Sciatica 07/10/2014  . Low back pain 05/10/2012  . UNSPECIFIED MYALGIA AND MYOSITIS 04/28/2008  . Anxiety and depression 09/08/2006  . Depressive disorder, not elsewhere classified 09/08/2006  . ALLERGIC RHINITIS 09/06/2006  . INSOMNIA 09/06/2006    Marry Guan, PT, MPT  07/11/2016, 12:43 PM  [facility] [address]  [address] [place], [place], [address] Phone: [phone]   Fax:  [phone]  Name: [name] [name] MRN: [id number] Date of Birth: 1975/07/27

## 2016-07-13 ENCOUNTER — Ambulatory Visit: Payer: 59

## 2016-07-13 DIAGNOSIS — R293 Abnormal posture: Secondary | ICD-10-CM

## 2016-07-13 DIAGNOSIS — M5442 Lumbago with sciatica, left side: Secondary | ICD-10-CM | POA: Diagnosis not present

## 2016-07-13 DIAGNOSIS — G8929 Other chronic pain: Secondary | ICD-10-CM

## 2016-07-13 DIAGNOSIS — M5441 Lumbago with sciatica, right side: Principal | ICD-10-CM

## 2016-07-13 NOTE — Patient Instructions (Signed)

## 2016-07-13 NOTE — Therapy (Signed)
[facility] [address]  [address] [place], [place], [address] Phone: [phone]   Fax:  [phone]  Physical Therapy Treatment  Patient Details  Name: [name] [name] MRN: [id number] Date of Birth: Jan 21, 1976 Referring Provider: Norton Blizzard, MD  Encounter Date: 07/13/2016      PT End of Session - 07/13/16 0859    Visit Number 2   Number of Visits 12   Date for PT Re-Evaluation 08/26/16   Authorization Type UHC   Authorization - Number of Visits 40   PT Start Time 0848   PT Stop Time 0940   PT Time Calculation (min) 52 min   Activity Tolerance Patient tolerated treatment well   Behavior During Therapy [facility] for tasks assessed/performed      Past Medical History:  Diagnosis Date  . Allergy   . Anal fissure   . Anxiety   . Depression   . External hemorrhoids     Past Surgical History:  Procedure Laterality Date  . COLONOSCOPY N/A 12/14/2012   Procedure: COLONOSCOPY;  Surgeon: Hart Carwin, MD;  Location: [facility];  Service: Endoscopy;  Laterality: N/A;  . WISDOM TOOTH EXTRACTION      There were no vitals filed for this visit.      Subjective Assessment - 07/13/16 0850    Subjective Pt. reports high work stress at this point in her job working 11 hr shifts.     Patient Stated Goals "Get rid of the dull ache and strengthen my core. Sleep better."   Currently in Pain? Yes   Pain Score 3    Pain Location Back   Pain Orientation Lower   Pain Descriptors / Indicators Sharp;Stabbing   Pain Radiating Towards aching into B LE's    Pain Onset More than a month ago   Pain Frequency Constant   Multiple Pain Sites No                         [facility] Adult PT Treatment/Exercise - 07/13/16 0906      Self-Care   Self-Care Other Self-Care Comments   Other Self-Care Comments  Discussion with example from postural handout for proper sitting posture, desk setup, carrying technique; instruction on  importance of switching carrying bags either side to reduce stress on back; instruction and rationale behind frequent change in positions throughout work day.       Lumbar Exercises: Stretches   Passive Hamstring Stretch 2 reps;30 seconds   Passive Hamstring Stretch Limitations with strap; bilaterally    Hip Flexor Stretch 2 reps;30 seconds   Hip Flexor Stretch Limitations with strap in mod thomas; bilaterally     Lumbar Exercises: Aerobic   Stationary Bike NuStep: lvl 4, 8 min      Lumbar Exercises: Supine   Clam 15 reps;3 seconds   Clam Limitations alternating with red TB + abdom. bracing   Bridge 15 reps;3 seconds  cues for abdom. bracing   Bridge Limitations with sustained hip abd with red TB    Other Supine Lumbar Exercises Pelvic tilt 5" x 10 reps     Knee/Hip Exercises: Standing   Hip Flexion AROM;1 set;Right;Left;Stengthening;10 reps   Hip Flexion Limitations holding onto TM; with red looped TB    Hip Abduction AROM;Right;Left;Stengthening;1 set;10 reps;Knee straight   Abduction Limitations holding onto TM; with red looped TB    Hip Extension AROM;Left;Right;Stengthening;1 set;10 reps;Knee straight   Extension Limitations holding onto TM;  with red looped TB      Modalities   Modalities Moist Heat     Moist Heat Therapy   Number Minutes Moist Heat 10 Minutes   Moist Heat Location Lumbar Spine                  PT Short Term Goals - 07/13/16 0917      PT SHORT TERM GOAL #1   Title Independent with initial HEP by 07/25/16   Status On-going     PT SHORT TERM GOAL #2   Title Pt will verbalize understanding of neutral spine posture and proper body mechanics to reduce lumbar strain during daily tasks by 07/25/16   Status On-going           PT Long Term Goals - 07/13/16 0900      PT LONG TERM GOAL #1   Title Independent with advanced HEP by 08/26/16   Status On-going     PT LONG TERM GOAL #2   Title B hip and knee strength >/= 4/5 by 08/26/16   Status  On-going     PT LONG TERM GOAL #3   Title Pt will tolerate sitting at her desk for >/= 30 minutes w/o limitation d/t LBP or radicular pain by 08/26/16   Status On-going     PT LONG TERM GOAL #4   Title Pt will report able to sleep >/= 4 hrs w/o limitations due to LBP or leg pain by 08/26/16   Status On-going               Plan - 07/13/16 0900    Clinical Impression Statement Today's treatment with HEP review.  Pt. able to demo good overall technique however reports she has not been able to perform HEP at all.  Pt. reports working 11 hr work days at this point.  Importance of changing positions and postural awareness discussed with pt. today.  Pt. reports carrying heavy bags on same side for work thus pt. instructed on importance of switching carry sides frequently.  Proper workstation setup reviewed with pt. today.  Pt. reports frequently slumping forward to type at computer throughout workday.  Importance of proper sitting posture with rationale discussed with pt. Lumbopelvic strengthening activity progressed today and tolerated well.  Pt. ending treatment with mild low back pain thus moist heat applied to lumbar spine to promote muscular relaxation.  Pt. to continue to benefit from further skilled therapy to improve body mechanics and posture with work related tasks and maximize function.     PT Treatment/Interventions Patient/family education;Neuromuscular re-education;Therapeutic exercise;Therapeutic activities;Manual techniques;Dry needling;Taping;Electrical Stimulation;Moist Heat;Ultrasound;Cryotherapy;Traction;ADLs/Self Care Home Management   PT Next Visit Plan Monitor adherence to initial HEP; Review body mechanics+posture prn; Lumbar/core stabilization & proximal LE strengthening; Manual therapy & modalities PRN      Patient will benefit from skilled therapeutic intervention in order to improve the following deficits and impairments:  Pain, Increased muscle spasms, Impaired  flexibility, Postural dysfunction, Improper body mechanics, Decreased strength, Decreased activity tolerance  Visit Diagnosis: Chronic bilateral low back pain with bilateral sciatica  Abnormal posture     Problem List Patient Active Problem List   Diagnosis Date Noted  . Sciatica 07/10/2014  . Low back pain 05/10/2012  . UNSPECIFIED MYALGIA AND MYOSITIS 04/28/2008  . Anxiety and depression 09/08/2006  . Depressive disorder, not elsewhere classified 09/08/2006  . ALLERGIC RHINITIS 09/06/2006  . INSOMNIA 09/06/2006    Kermit BaloMicah Suesan Mohrmann, PTA 07/13/16 5:32 PM   [facility]  [facility] [address]  [address] [place], [place], [address] Phone: [phone]   Fax:  [phone]  Name: [name] [name] MRN: [id number] Date of Birth: May 06, 1975

## 2016-07-18 ENCOUNTER — Ambulatory Visit: Payer: 59 | Admitting: Physical Therapy

## 2016-07-20 ENCOUNTER — Ambulatory Visit: Payer: 59

## 2016-07-20 ENCOUNTER — Ambulatory Visit
Admission: RE | Admit: 2016-07-20 | Discharge: 2016-07-20 | Disposition: A | Discharge status: Home / Self Care | Payer: 59 | Source: Ambulatory Visit | Attending: Family Medicine | Admitting: Family Medicine

## 2016-07-20 DIAGNOSIS — M5416 Radiculopathy, lumbar region: Secondary | ICD-10-CM

## 2016-07-20 MED ORDER — IOPAMIDOL (ISOVUE-M 200) INJECTION 41%
1.0000 mL | Freq: Once | INTRAMUSCULAR | Status: AC
  Administered 2016-07-20: 1 mL via EPIDURAL

## 2016-07-20 MED ORDER — METHYLPREDNISOLONE ACETATE 40 MG/ML INJ SUSP (RADIOLOG
120.0000 mg | Freq: Once | INTRAMUSCULAR | Status: AC
  Administered 2016-07-20: 120 mg via EPIDURAL

## 2016-07-20 NOTE — Discharge Instructions (Signed)

## 2016-07-25 ENCOUNTER — Telehealth: Payer: Self-pay | Admitting: *Deleted

## 2016-07-25 ENCOUNTER — Ambulatory Visit: Payer: 59

## 2016-07-25 DIAGNOSIS — M5442 Lumbago with sciatica, left side: Secondary | ICD-10-CM | POA: Diagnosis not present

## 2016-07-25 DIAGNOSIS — G8929 Other chronic pain: Secondary | ICD-10-CM

## 2016-07-25 DIAGNOSIS — M5441 Lumbago with sciatica, right side: Principal | ICD-10-CM

## 2016-07-25 DIAGNOSIS — R293 Abnormal posture: Secondary | ICD-10-CM

## 2016-07-25 NOTE — Therapy (Addendum)
[facility] [address]  [place], [place], [address] Phone: [phone]   Fax:  [phone]  Physical Therapy Treatment  Patient Details  Name: [name] [name] MRN: [id number] Date of Birth: May 24, 1975 Referring Provider: Karlton Lemon, MD  Encounter Date: 07/25/2016      PT End of Session - 07/25/16 1025    Visit Number 3   Number of Visits 12   Date for PT Re-Evaluation 08/26/16   Authorization Type UHC   Authorization - Number of Visits 40   PT Start Time 1017   PT Stop Time 1115   PT Time Calculation (min) 58 min   Activity Tolerance Patient tolerated treatment well   Behavior During Therapy [facility] for tasks assessed/performed      Past Medical History:  Diagnosis Date  . Allergy   . Anal fissure   . Anxiety   . Depression   . External hemorrhoids     Past Surgical History:  Procedure Laterality Date  . COLONOSCOPY N/A 12/14/2012   Procedure: COLONOSCOPY;  Surgeon: Lafayette Dragon, MD;  Location: [facility];  Service: Endoscopy;  Laterality: N/A;  . WISDOM TOOTH EXTRACTION      There were no vitals filed for this visit.      Subjective Assessment - 07/25/16 1019    Subjective Pt. reporting she received injections in her back on Wednesday.  Pt. reporting B LE's radiating pain is still present however better.     Patient Stated Goals "Get rid of the dull ache and strengthen my core. Sleep better."   Currently in Pain? Yes   Pain Score 3    Pain Location Back   Pain Orientation Lower   Pain Descriptors / Indicators Sharp;Aching   Pain Radiating Towards Aching into B LE's    Pain Onset More than a month ago   Pain Frequency Constant   Aggravating Factors  bending, or reaching   Pain Relieving Factors heat    Effect of Pain on Daily Activities limits doing dishes, washing car   Multiple Pain Sites No                         [facility] Adult PT Treatment/Exercise - 07/25/16 1029       Self-Care   Self-Care Other Self-Care Comments   Other Self-Care Comments  Discussion of need for using good body mechanics while washing dishes, washing dogs, etc; bent leg squatting vs straight leg bend; pt. now using counter door foot prop technique while washing dishes to reduce lumbar strain      Lumbar Exercises: Stretches   Passive Hamstring Stretch 2 reps;30 seconds   Passive Hamstring Stretch Limitations with strap; bilaterally    Double Knee to Chest Stretch 3 reps;30 seconds   Double Knee to Chest Stretch Limitations Seated with red p-ball (75cm) roll out   Hip Flexor Stretch 2 reps;30 seconds   Hip Flexor Stretch Limitations with strap in mod thomas; bilaterally     Lumbar Exercises: Aerobic   Stationary Bike NuStep: lvl 5, 8 min      Lumbar Exercises: Supine   Bridge 15 reps;5 seconds   Bridge Limitations with sustained hip abd with green TB    Other Supine Lumbar Exercises Pelvic tilt 5" x 15 reps with sustained hip abduction with red TB      Lumbar Exercises: Sidelying   Clam 10 reps;3 seconds   Clam Limitations with red TB  around knees      Lumbar Exercises: Quadruped   [place] 10 reps   Straight Leg Raise 10 reps;3 seconds     Knee/Hip Exercises: Supine   Hip Adduction Isometric AROM;Both;1 set;10 reps   Hip Adduction Isometric Limitations Hooklying 5" adduction ball squeeze      Modalities   Modalities Electrical Stimulation;Moist Heat     Moist Heat Therapy   Number Minutes Moist Heat 15 Minutes   Moist Heat Location Lumbar Spine     Electrical Stimulation   Electrical Stimulation Location Lumbar spine   Electrical Stimulation Action IFC    Electrical Stimulation Parameters 80-150Hz , 15 min, intensity to pt. tolerance    Electrical Stimulation Goals Pain                PT Education - 07/25/16 1109    Education provided Yes   Education Details adduction ball squeeze, bridge abduction with red TB issued to pt., clam shell with  red TB   Person(s) Educated Patient   Methods Explanation;Demonstration;Verbal cues;Handout   Comprehension Verbalized understanding;Returned demonstration;Verbal cues required;Need further instruction          PT Short Term Goals - 07/25/16 1214      PT SHORT TERM GOAL #1   Title Independent with initial HEP by 07/25/16   Status Partially Met  3.26.18: limited compliance however demonstrating good technique      PT SHORT TERM GOAL #2   Title Pt will verbalize understanding of neutral spine posture and proper body mechanics to reduce lumbar strain during daily tasks by 07/25/16   Status Achieved           PT Long Term Goals - 07/13/16 0900      PT LONG TERM GOAL #1   Title Independent with advanced HEP by 08/26/16   Status On-going     PT LONG TERM GOAL #2   Title B hip and knee strength >/= 4/5 by 08/26/16   Status On-going     PT LONG TERM GOAL #3   Title Pt will tolerate sitting at her desk for >/= 30 minutes w/o limitation d/t LBP or radicular pain by 08/26/16   Status On-going     PT LONG TERM GOAL #4   Title Pt will report able to sleep >/= 4 hrs w/o limitations due to LBP or leg pain by 08/26/16   Status On-going               Plan - 07/25/16 1025    Clinical Impression Statement Pt. received an injection to lower back on Wednesday and feels this has helped decrease pain some.  Pain is now less severe and, "less stabbing".  Pt. verbalizing improved postural awareness with daily activities today.  Today's treatment with mild progression of lumbopelvic stability activities with addition of quadruped SLR.  Pt. tolerated progression well with LBP remaining well controlled with therex.  HEP updated to include basic lumbopelvic strengthening activities today with pt. able to demo all activities in therapy without pain increase.  Treatment ending with moist heat/E-stim combo to promote muscular relaxation and reduce pain.    PT Treatment/Interventions Patient/family  education;Neuromuscular re-education;Therapeutic exercise;Therapeutic activities;Manual techniques;Dry needling;Taping;Electrical Stimulation;Moist Heat;Ultrasound;Cryotherapy;Traction;ADLs/Self Care Home Management   PT Next Visit Plan Review updated HEP; Review body mechanics+posture prn; Lumbar/core stabilization & proximal LE strengthening; Manual therapy & modalities PRN      Patient will benefit from skilled therapeutic intervention in order to improve the following deficits and impairments:  Pain,  Increased muscle spasms, Impaired flexibility, Postural dysfunction, Improper body mechanics, Decreased strength, Decreased activity tolerance  Visit Diagnosis: Chronic bilateral low back pain with bilateral sciatica  Abnormal posture     Problem List Patient Active Problem List   Diagnosis Date Noted  . Sciatica 07/10/2014  . Low back pain 05/10/2012  . UNSPECIFIED MYALGIA AND MYOSITIS 04/28/2008  . Anxiety and depression 09/08/2006  . Depressive disorder, not elsewhere classified 09/08/2006  . ALLERGIC RHINITIS 09/06/2006  . INSOMNIA 09/06/2006    Bess Harvest, PTA 07/25/16 12:27 PM   [facility] [address]  [place], [place], [address] Phone: [phone]   Fax:  [phone]  Name: [name] [name] MRN: [id number] Date of Birth: [birth year]

## 2016-07-25 NOTE — Telephone Encounter (Signed)
[id number] states sent rx for diclofenac. I reviewed pt's clinicals and pt has not been seen since 04/2016, needs an appt. Return faxed request denied for diclofenac with note for pt to make an appt.

## 2016-07-27 ENCOUNTER — Ambulatory Visit: Payer: 59

## 2016-08-01 ENCOUNTER — Ambulatory Visit: Payer: 59 | Admitting: Physical Therapy

## 2016-08-02 ENCOUNTER — Telehealth: Payer: Self-pay | Admitting: Family Medicine

## 2016-08-02 NOTE — Telephone Encounter (Signed)
Ok to go ahead with repeat ESIs.  Thanks!

## 2016-08-02 NOTE — Telephone Encounter (Signed)
Patient calling stating she is still experiencing pain in her lower back and would like to know if she can receive another ESI at [facility]

## 2016-08-03 ENCOUNTER — Telehealth: Payer: Self-pay | Admitting: Family Medicine

## 2016-08-03 ENCOUNTER — Ambulatory Visit: Payer: 59 | Attending: Family Medicine | Admitting: Physical Therapy

## 2016-08-03 ENCOUNTER — Other Ambulatory Visit: Payer: Self-pay | Admitting: Family Medicine

## 2016-08-03 DIAGNOSIS — M5441 Lumbago with sciatica, right side: Secondary | ICD-10-CM | POA: Insufficient documentation

## 2016-08-03 DIAGNOSIS — M5442 Lumbago with sciatica, left side: Secondary | ICD-10-CM | POA: Diagnosis present

## 2016-08-03 DIAGNOSIS — G8929 Other chronic pain: Secondary | ICD-10-CM | POA: Diagnosis present

## 2016-08-03 DIAGNOSIS — R293 Abnormal posture: Secondary | ICD-10-CM | POA: Insufficient documentation

## 2016-08-03 DIAGNOSIS — M5416 Radiculopathy, lumbar region: Secondary | ICD-10-CM

## 2016-08-03 MED ORDER — GABAPENTIN 300 MG PO CAPS: ORAL_CAPSULE | ORAL | 2 refills | Status: DC

## 2016-08-03 NOTE — Telephone Encounter (Signed)
Spoke to patient and she would like to try the gabapentin. [facility] to the [facility] [address].

## 2016-08-03 NOTE — Telephone Encounter (Signed)
Either nortriptyline or gabapentin can help with the nerve symptoms.  The problem is these medicines don't fix the problem but can help with the pain from neuropathy.  She could try the gabapentin at nighttime first then if she does well and it doesn't make her too sleepy typically it's a medicine you would titrate up to three times a day.

## 2016-08-03 NOTE — Telephone Encounter (Signed)
Sent in; thanks

## 2016-08-03 NOTE — Telephone Encounter (Signed)
Patient calling stating she is experiencing nerve aggravation and pain down her legs to the point where she is having difficulty sitting still at work.   She is still waiting on a call from [facility] to schedule the ESI. Patient wanted to know if a Rx can be called in to help until the appointment. A friend told her about gabapentin but wants to know providers opinion.

## 2016-08-03 NOTE — Therapy (Signed)
[facility] [address]  [place], [place], [address] Phone: [phone]   Fax:  [phone]  Physical Therapy Treatment  Patient Details  Name: [name] [name] MRN: [id number] Date of Birth: 31-Jan-1976 Referring Provider: Karlton Lemon, MD  Encounter Date: 08/03/2016      PT End of Session - 08/03/16 1706    Visit Number 4   Number of Visits 12   Date for PT Re-Evaluation 08/26/16   Authorization Type UHC   Authorization - Number of Visits 40   PT Start Time 1700   PT Stop Time 1800   PT Time Calculation (min) 60 min   Activity Tolerance Patient tolerated treatment well   Behavior During Therapy [facility] for tasks assessed/performed      Past Medical History:  Diagnosis Date  . Allergy   . Anal fissure   . Anxiety   . Depression   . External hemorrhoids     Past Surgical History:  Procedure Laterality Date  . COLONOSCOPY N/A 12/14/2012   Procedure: COLONOSCOPY;  Surgeon: Lafayette Dragon, MD;  Location: [facility];  Service: Endoscopy;  Laterality: N/A;  . WISDOM TOOTH EXTRACTION      There were no vitals filed for this visit.      Subjective Assessment - 08/03/16 1705    Subjective Spoke to MD today - getting gabapentin for nerve pain. 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   Patient Stated Goals "Get rid of the dull ache and strengthen my core. Sleep better."   Currently in Pain? Yes   Pain Score 4    Pain Location Back   Pain Orientation Right;Left;Lower   Pain Descriptors / Indicators Aching                         [facility] Adult PT Treatment/Exercise - 08/03/16 1706      Lumbar Exercises: Stretches   Double Knee to Chest Stretch 3 reps;30 seconds   Double Knee to Chest Stretch  Limitations supine   Lower Trunk Rotation Limitations 10 reps B sides     Lumbar Exercises: Aerobic   Stationary Bike L2 x 5 minutes      Lumbar Exercises: Standing   Other Standing Lumbar Exercises hip hinge 10 x 10"   Other Standing Lumbar Exercises pallof press - double red tband x 10 each side     Lumbar Exercises: Seated   LAQ on Chair Limitations sciatic nerve glide x 10 each LE     Lumbar Exercises: Supine   Ab Set 15 reps   AB Set Limitations with LE marches; progression to dead bug x 15 reps   Isometric Hip Flexion 15 reps;5 seconds     Lumbar Exercises: Sidelying   Other Sidelying Lumbar Exercises open book 10 x each side     Modalities   Modalities Electrical Stimulation;Traction     Electrical Stimulation   Electrical Stimulation Location Lumbar spine   Electrical Stimulation Action IFC   Electrical Stimulation Parameters to tolerance   Electrical Stimulation Goals Pain     Traction   Type of Traction Lumbar   Min (lbs) 32   Max (lbs) 40   Hold Time 60   Rest Time 20   Time 15  PT Short Term Goals - 07/25/16 1214      PT SHORT TERM GOAL #1   Title Independent with initial HEP by 07/25/16   Status Partially Met  3.26.18: limited compliance however demonstrating good technique      PT SHORT TERM GOAL #2   Title Pt will verbalize understanding of neutral spine posture and proper body mechanics to reduce lumbar strain during daily tasks by 07/25/16   Status Achieved           PT Long Term Goals - 07/13/16 0900      PT LONG TERM GOAL #1   Title Independent with advanced HEP by 08/26/16   Status On-going     PT LONG TERM GOAL #2   Title B hip and knee strength >/= 4/5 by 08/26/16   Status On-going     PT LONG TERM GOAL #3   Title Pt will tolerate sitting at her desk for >/= 30 minutes w/o limitation d/t LBP or radicular pain by 08/26/16   Status On-going     PT LONG TERM GOAL #4   Title Pt will report able to sleep >/=  4 hrs w/o limitations due to LBP or leg pain by 08/26/16   Status On-going               Plan - 08/03/16 1742    Clinical Impression Statement Patient reporting continued low back pain as well as general nerve pain into B LE - speaking with MD today and obtaining prescription for gabapentin. Patient doing well with all lumbar stretvhing as well as core strengthening today with no increase in pain. Sciatic nerve glide issued today for hopeful pain relief when sitting. Lumbar traction initiated today with patient very tolerable to mechanical traction.   PT Treatment/Interventions Patient/family education;Neuromuscular re-education;Therapeutic exercise;Therapeutic activities;Manual techniques;Dry needling;Taping;Electrical Stimulation;Moist Heat;Ultrasound;Cryotherapy;Traction;ADLs/Self Care Home Management   PT Next Visit Plan  Lumbar/core stabilization & proximal LE strengthening; Manual therapy & modalities PRN; assess response and continue mechanical traction if indicated   Consulted and Agree with Plan of Care Patient      Patient will benefit from skilled therapeutic intervention in order to improve the following deficits and impairments:  Pain, Increased muscle spasms, Impaired flexibility, Postural dysfunction, Improper body mechanics, Decreased strength, Decreased activity tolerance  Visit Diagnosis: Chronic bilateral low back pain with bilateral sciatica  Abnormal posture     Problem List Patient Active Problem List   Diagnosis Date Noted  . Sciatica 07/10/2014  . Low back pain 05/10/2012  . UNSPECIFIED MYALGIA AND MYOSITIS 04/28/2008  . Anxiety and depression 09/08/2006  . Depressive disorder, not elsewhere classified 09/08/2006  . ALLERGIC RHINITIS 09/06/2006  . INSOMNIA 09/06/2006    Lanney Gins, PT, DPT 08/03/16 6:01 PM   [facility] [address]  [place], [place], [address] Phone:  [phone]   Fax:  [phone]  Name: MILI PILTZ MRN: [id number] Date of Birth: 1976/01/26

## 2016-08-08 ENCOUNTER — Ambulatory Visit: Payer: 59 | Admitting: Physical Therapy

## 2016-08-08 DIAGNOSIS — G8929 Other chronic pain: Secondary | ICD-10-CM

## 2016-08-08 DIAGNOSIS — M5442 Lumbago with sciatica, left side: Principal | ICD-10-CM

## 2016-08-08 DIAGNOSIS — R293 Abnormal posture: Secondary | ICD-10-CM

## 2016-08-08 DIAGNOSIS — M5441 Lumbago with sciatica, right side: Principal | ICD-10-CM

## 2016-08-08 NOTE — Therapy (Addendum)
[facility] [address]  [place], [place], [address] Phone: [phone]   Fax:  [phone]  Physical Therapy Treatment  Patient Details  Name: [name] [name] MRN: [id number] Date of Birth: July 20, 1975 Referring Provider: Karlton Lemon, MD  Encounter Date: 08/08/2016      PT End of Session - 08/08/16 1700    Visit Number 5   Number of Visits 12   Date for PT Re-Evaluation 08/26/16   Authorization Type UHC   Authorization - Number of Visits 40   PT Start Time 1700   PT Stop Time 1742   PT Time Calculation (min) 42 min   Activity Tolerance Patient tolerated treatment well   Behavior During Therapy [facility] for tasks assessed/performed      Past Medical History:  Diagnosis Date  . Allergy   . Anal fissure   . Anxiety   . Depression   . External hemorrhoids     Past Surgical History:  Procedure Laterality Date  . COLONOSCOPY N/A 12/14/2012   Procedure: COLONOSCOPY;  Surgeon: Lafayette Dragon, MD;  Location: [facility];  Service: Endoscopy;  Laterality: N/A;  . WISDOM TOOTH EXTRACTION      There were no vitals filed for this visit.      Subjective Assessment - 08/08/16 1703    Subjective Pt reports pain is improving overall but feels different today. Has started the gabapentin and feels like this is helping. Reports she is scheduled for anither round in injections in her back on Thursday.   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   Patient Stated Goals "Get rid of the dull ache and strengthen my core. Sleep better."   Currently in Pain? Yes   Pain Score 3    Pain Location Back   Pain Orientation Lower;Left;Right  L>R   Pain Descriptors / Indicators Sharp;Stabbing   Pain Type Acute pain   Pain Onset More than a month ago   Pain Frequency Intermittent                          [facility] Adult PT Treatment/Exercise - 08/08/16 1700      Lumbar Exercises: Aerobic   Stationary Bike L2 x 6 minutes      Manual Therapy   Manual Therapy Soft tissue mobilization;Myofascial release;Joint mobilization;Muscle Energy Technique;Taping   Manual therapy comments pt in prone & sidelying   Joint Mobilization sacral P/A mobs, gapping & compression   Soft tissue mobilization STM/DTM to L prirformis    Myofascial Release IASTM & self-release for B piriformis with 4" ball & foam roller   Muscle Energy Technique MET for correction of apparent L posterior innominate rotation of scarum on pelvis followed by pelvic shotgun - alignment appeared corrected after MET   Kinesiotex Create Space     Kinesiotix   Create Space 50% star over L SIJ                PT Education - 08/08/16 1742    Education provided Yes   Education Details STM/MFR & stretching for piriformis   Person(s) Educated Patient   Methods Explanation;Demonstration;Handout   Comprehension Verbalized understanding;Returned demonstration          PT Short Term Goals - 08/08/16 1744      PT SHORT TERM GOAL #1  Title Independent with initial HEP by 07/25/16   Status Achieved     PT SHORT TERM GOAL #2   Title Pt will verbalize understanding of neutral spine posture and proper body mechanics to reduce lumbar strain during daily tasks by 07/25/16   Status Achieved           PT Long Term Goals - 07/13/16 0900      PT LONG TERM GOAL #1   Title Independent with advanced HEP by 08/26/16   Status On-going     PT LONG TERM GOAL #2   Title B hip and knee strength >/= 4/5 by 08/26/16   Status On-going     PT LONG TERM GOAL #3   Title Pt will tolerate sitting at her desk for >/= 30 minutes w/o limitation d/t LBP or radicular pain by 08/26/16   Status On-going     PT LONG TERM GOAL #4   Title Pt will report able to sleep >/= 4 hrs w/o limitations due to LBP or leg pain by  08/26/16   Status On-going               Plan - 08/08/16 1706    Clinical Impression Statement Pt reporting continued B lower back/SIJ/buttock pain but states pain has changed and is more sharp/stabbing in nature. Overall flexibility remains good but palpation of piriformii revealed increased muscle tension L>R, therefore initiated manual therapy to the L piriformis with relief noted. Instructed pt in self-release with small ball and foam roller for home followed by multiple options for pirifomis stretching, with pt instructed to choose 2 or 3 stretches that give greatest relief. Given reports of SIJ pain, assessed SIJ alignment with pt demonstrating apparent L posterior innominate rotation of pelvis on sacrum which responded well to MET with correction of alignment. Kinesiotaping with 50% star of L SIJ applied to promote further muscel relaxation and decreased pain. Pending response to taping and piriformis release/stretching may attempt taping for piriformis atr next visit.   Rehab Potential Good   PT Treatment/Interventions Patient/family education;Neuromuscular re-education;Therapeutic exercise;Therapeutic activities;Manual techniques;Dry needling;Taping;Electrical Stimulation;Moist Heat;Ultrasound;Cryotherapy;Traction;ADLs/Self Care Home Management   PT Next Visit Plan Assess reponse to taping & reassess SIJ alignment as indicated; Possible kinesiotaping for SIJ +/- piriformis; Lumbar/core stabilization & proximal LE strengthening; Manual therapy & modalities PRN; assess response and continue mechanical traction if indicated   Consulted and Agree with Plan of Care Patient      Patient will benefit from skilled therapeutic intervention in order to improve the following deficits and impairments:  Pain, Increased muscle spasms, Impaired flexibility, Postural dysfunction, Improper body mechanics, Decreased strength, Decreased activity tolerance  Visit Diagnosis: Chronic bilateral low back  pain with bilateral sciatica  Abnormal posture     Problem List Patient Active Problem List   Diagnosis Date Noted  . Sciatica 07/10/2014  . Low back pain 05/10/2012  . UNSPECIFIED MYALGIA AND MYOSITIS 04/28/2008  . Anxiety and depression 09/08/2006  . Depressive disorder, not elsewhere classified 09/08/2006  . ALLERGIC RHINITIS 09/06/2006  . INSOMNIA 09/06/2006    Percival Spanish, PT, MPT 08/08/2016, 6:27 PM  [facility] [address]  [place], [place], [address] Phone: [phone]   Fax:  [phone]  Name: [name] [name] MRN: [id number] Date of Birth: 07-23-1975

## 2016-08-10 ENCOUNTER — Other Ambulatory Visit: Payer: 59

## 2016-08-10 ENCOUNTER — Ambulatory Visit: Payer: 59

## 2016-08-11 ENCOUNTER — Ambulatory Visit
Admission: RE | Admit: 2016-08-11 | Discharge: 2016-08-11 | Disposition: A | Discharge status: Home / Self Care | Payer: 59 | Source: Ambulatory Visit | Attending: Family Medicine | Admitting: Family Medicine

## 2016-08-11 ENCOUNTER — Other Ambulatory Visit: Payer: Self-pay | Admitting: Family Medicine

## 2016-08-11 ENCOUNTER — Ambulatory Visit: Payer: 59 | Admitting: Physical Therapy

## 2016-08-11 DIAGNOSIS — M5416 Radiculopathy, lumbar region: Secondary | ICD-10-CM

## 2016-08-11 MED ORDER — IOPAMIDOL (ISOVUE-M 200) INJECTION 41%
1.0000 mL | Freq: Once | INTRAMUSCULAR | Status: AC
  Administered 2016-08-11: 1 mL via EPIDURAL

## 2016-08-11 MED ORDER — METHYLPREDNISOLONE ACETATE 40 MG/ML INJ SUSP (RADIOLOG
120.0000 mg | Freq: Once | INTRAMUSCULAR | Status: AC
  Administered 2016-08-11: 120 mg via EPIDURAL

## 2016-08-11 NOTE — Discharge Instructions (Signed)

## 2016-08-15 ENCOUNTER — Ambulatory Visit: Payer: 59 | Admitting: Physical Therapy

## 2016-08-15 DIAGNOSIS — M5442 Lumbago with sciatica, left side: Secondary | ICD-10-CM | POA: Diagnosis not present

## 2016-08-15 DIAGNOSIS — G8929 Other chronic pain: Secondary | ICD-10-CM

## 2016-08-15 DIAGNOSIS — R293 Abnormal posture: Secondary | ICD-10-CM

## 2016-08-15 DIAGNOSIS — M5441 Lumbago with sciatica, right side: Principal | ICD-10-CM

## 2016-08-15 NOTE — Therapy (Signed)
[facility] [address]  [place], [place], [address] Phone: [phone]   Fax:  [phone]  Physical Therapy Treatment  Patient Details  Name: [name] [name] MRN: [id number] Date of Birth: January 26, 1976 Referring Provider: Karlton Lemon, MD  Encounter Date: 08/15/2016      PT End of Session - 08/15/16 1703    Visit Number 6   Number of Visits 12   Date for PT Re-Evaluation 08/26/16   Authorization Type UHC   Authorization - Number of Visits 40   PT Start Time 1703   PT Stop Time 1808   PT Time Calculation (min) 65 min   Activity Tolerance Patient tolerated treatment well   Behavior During Therapy [facility] for tasks assessed/performed      Past Medical History:  Diagnosis Date  . Allergy   . Anal fissure   . Anxiety   . Depression   . External hemorrhoids     Past Surgical History:  Procedure Laterality Date  . COLONOSCOPY N/A 12/14/2012   Procedure: COLONOSCOPY;  Surgeon: Lafayette Dragon, MD;  Location: [facility];  Service: Endoscopy;  Laterality: N/A;  . WISDOM TOOTH EXTRACTION      There were no vitals filed for this visit.      Subjective Assessment - 08/15/16 1706    Subjective Pt reports she did receive the injections on Thursday but does not feel that they have kicked in yet. Pain was bad over the weekend, but ~50% better today.   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   Patient Stated Goals "Get rid of the dull ache and strengthen my core. Sleep better."   Currently in Pain? Yes   Pain Score 3   2.5-3/10   Pain Location Back   Pain Orientation Left;Lower   Pain Descriptors / Indicators Sharp;Aching   Pain Type Acute pain   Pain Radiating Towards L buttock and back of thigh   Pain Onset More than a month ago   Pain Frequency Intermittent    Aggravating Factors  standing to blow dry hair, lying on back, static standing   Pain Relieving Factors sidelying, stretching   Effect of Pain on Daily Activities limits grooming                         [facility] Adult PT Treatment/Exercise - 08/15/16 1703      Lumbar Exercises: Aerobic   Stationary Bike L2 x 6 minutes      Lumbar Exercises: Supine   Bridge 15 reps;3 seconds   Bridge Limitations + alt hip ABD/ER with green TB     Lumbar Exercises: Quadruped   Madcat/Old Horse 15 reps   Opposite Arm/Leg Raise Right arm/Left leg;Left arm/Right leg;10 reps;3 seconds     Modalities   Modalities Traction     Traction   Type of Traction Lumbar   Min (lbs) 35   Max (lbs) 45   Hold Time 60   Rest Time 20   Time 12     Manual Therapy   Manual Therapy Soft tissue mobilization;Myofascial release;Joint mobilization;Muscle Energy Technique;Taping   Manual therapy comments pt in prone & sidelying   Joint Mobilization sacral P/A mobs, gapping & compression   Soft tissue mobilization STM/DTM to L prirformis    Myofascial Release Roller stick to R HS  Muscle Energy Technique MET for correction of apparent L anterior innominate rotation of scarum on pelvis followed by pelvic shotgun - alignment appeared corrected after MET   Kinesiotex Inhibit Muscle     Kinesiotix   Create Space L piriformis - 30% along muscle + 50% perpendicular at area of greatest ttp                  PT Short Term Goals - 08/08/16 1744      PT SHORT TERM GOAL #1   Title Independent with initial HEP by 07/25/16   Status Achieved     PT SHORT TERM GOAL #2   Title Pt will verbalize understanding of neutral spine posture and proper body mechanics to reduce lumbar strain during daily tasks by 07/25/16   Status Achieved           PT Long Term Goals - 07/13/16 0900      PT LONG TERM GOAL #1   Title Independent with advanced HEP by 08/26/16   Status On-going     PT LONG TERM GOAL #2    Title B hip and knee strength >/= 4/5 by 08/26/16   Status On-going     PT LONG TERM GOAL #3   Title Pt will tolerate sitting at her desk for >/= 30 minutes w/o limitation d/t LBP or radicular pain by 08/26/16   Status On-going     PT LONG TERM GOAL #4   Title Pt will report able to sleep >/= 4 hrs w/o limitations due to LBP or leg pain by 08/26/16   Status On-going               Plan - 08/15/16 1734    Clinical Impression Statement Pt not yet noting benefit from injections received last Thursday, with pain increasing through latter part of last week, although better today. Reassessed L SIJ with slight anterior innominate rotation evident but corrected with MET. Continued manual work for tight piriformis followed by kinesiotaping to promote further muscle relaxation. Progressed lumbar stabilization/strengthening exercises with good tolerance. Visit completed with mechanical traction given continued radicular symptoms.   Rehab Potential Good   PT Treatment/Interventions Patient/family education;Neuromuscular re-education;Therapeutic exercise;Therapeutic activities;Manual techniques;Dry needling;Taping;Electrical Stimulation;Moist Heat;Ultrasound;Cryotherapy;Traction;ADLs/Self Care Home Management   PT Next Visit Plan Assess reponse to taping & reassess SIJ alignment as indicated; Kinesiotaping for SIJ +/- piriformis as benefit noted; Lumbar/core stabilization & proximal LE strengthening; Manual therapy & modalities PRN; assess response and continue mechanical traction if indicated   Consulted and Agree with Plan of Care Patient      Patient will benefit from skilled therapeutic intervention in order to improve the following deficits and impairments:  Pain, Increased muscle spasms, Impaired flexibility, Postural dysfunction, Improper body mechanics, Decreased strength, Decreased activity tolerance  Visit Diagnosis: Chronic bilateral low back pain with bilateral sciatica  Abnormal  posture     Problem List Patient Active Problem List   Diagnosis Date Noted  . Sciatica 07/10/2014  . Low back pain 05/10/2012  . UNSPECIFIED MYALGIA AND MYOSITIS 04/28/2008  . Anxiety and depression 09/08/2006  . Depressive disorder, not elsewhere classified 09/08/2006  . ALLERGIC RHINITIS 09/06/2006  . INSOMNIA 09/06/2006    Percival Spanish, PT, MPT 08/15/2016, 6:27 PM  [facility] [address]  [place], [place], [address] Phone: [phone]   Fax:  [phone]  Name: [name] [name] MRN: [id number] Date of Birth: [birth year]

## 2016-08-17 ENCOUNTER — Ambulatory Visit: Payer: 59

## 2016-08-17 DIAGNOSIS — R293 Abnormal posture: Secondary | ICD-10-CM

## 2016-08-17 DIAGNOSIS — M5441 Lumbago with sciatica, right side: Principal | ICD-10-CM

## 2016-08-17 DIAGNOSIS — M5442 Lumbago with sciatica, left side: Principal | ICD-10-CM

## 2016-08-17 DIAGNOSIS — G8929 Other chronic pain: Secondary | ICD-10-CM

## 2016-08-17 NOTE — Therapy (Signed)
[facility] [address]  [address] [place], [place], [address] Phone: [phone]   Fax:  [phone]  Physical Therapy Treatment  Patient Details  Name: [name] [name] MRN: [id number] Date of Birth: 1975-10-29 Referring Provider: Norton Blizzard, MD  Encounter Date: 08/17/2016      PT End of Session - 08/17/16 1705    Visit Number 7   Number of Visits 12   Date for PT Re-Evaluation 08/26/16   Authorization Type UHC   Authorization - Number of Visits 40   PT Start Time 1658   PT Stop Time 1800   PT Time Calculation (min) 62 min   Activity Tolerance Patient tolerated treatment well   Behavior During Therapy [facility] for tasks assessed/performed      Past Medical History:  Diagnosis Date  . Allergy   . Anal fissure   . Anxiety   . Depression   . External hemorrhoids     Past Surgical History:  Procedure Laterality Date  . COLONOSCOPY N/A 12/14/2012   Procedure: COLONOSCOPY;  Surgeon: Hart Carwin, MD;  Location: [facility];  Service: Endoscopy;  Laterality: N/A;  . WISDOM TOOTH EXTRACTION      There were no vitals filed for this visit.      Subjective Assessment - 08/17/16 1706    Subjective Pt. reporting pain in L buttocks and back is, "nowhere near as bad as it was when I started here".     Patient Stated Goals "Get rid of the dull ache and strengthen my core. Sleep better."   Currently in Pain? Yes   Pain Score 2    Pain Location Back   Pain Orientation Left;Lower   Pain Descriptors / Indicators Sharp;Aching   Pain Type Acute pain   Pain Radiating Towards L buttocks    Pain Onset More than a month ago   Pain Frequency Intermittent                         [facility] Adult PT Treatment/Exercise - 08/17/16 1717      Self-Care   Self-Care Other Self-Care Comments   Other Self-Care Comments  B self-ball massage to piriformis x 1 min each; cueing to perform in seated piriformis stretch  position     Lumbar Exercises: Stretches   Passive Hamstring Stretch 2 reps;30 seconds   Passive Hamstring Stretch Limitations L    Piriformis Stretch 30 seconds;3 reps   Piriformis Stretch Limitations with therapist; figure-4, mod piriformis; seated self      Lumbar Exercises: Aerobic   Stationary Bike L2 x 7 minutes      Lumbar Exercises: Supine   Bridge 3 seconds;20 reps   Bridge Limitations + alt hip ABD/ER with green TB     Lumbar Exercises: Sidelying   Clam 3 seconds;15 reps   Clam Limitations green TB     Traction   Type of Traction Lumbar   Min (lbs) 38   Max (lbs) 50   Hold Time 60   Rest Time 20   Time 14     Manual Therapy   Manual Therapy Soft tissue mobilization;Myofascial release   Manual therapy comments Prone and R sidelying L LE on bolster    Soft tissue mobilization STM/DTM to L prirformis 2 x 3 min; piriformis stretches between; 2nd set with focused manual pressure   Myofascial Release Roller stick to R HS  PT Short Term Goals - 08/08/16 1744      PT SHORT TERM GOAL #1   Title Independent with initial HEP by 07/25/16   Status Achieved     PT SHORT TERM GOAL #2   Title Pt will verbalize understanding of neutral spine posture and proper body mechanics to reduce lumbar strain during daily tasks by 07/25/16   Status Achieved           PT Long Term Goals - 07/13/16 0900      PT LONG TERM GOAL #1   Title Independent with advanced HEP by 08/26/16   Status On-going     PT LONG TERM GOAL #2   Title B hip and knee strength >/= 4/5 by 08/26/16   Status On-going     PT LONG TERM GOAL #3   Title Pt will tolerate sitting at her desk for >/= 30 minutes w/o limitation d/t LBP or radicular pain by 08/26/16   Status On-going     PT LONG TERM GOAL #4   Title Pt will report able to sleep >/= 4 hrs w/o limitations due to LBP or leg pain by 08/26/16   Status On-going               Plan - 08/17/16 1718    Clinical  Impression Statement Pt. reporting she feels the last two treatments have helped her the most since starting therapy.  Pt. feels LBP and buttocks pain are, "nowhere near the pain when I started with therapy".  Pt. noting taping still intact and feels this has helped along with mechanical traction.  Pt. reports she has been performing self-ball massage at home with good relief until re-agitated by prolonged sitting.  Pt. now using self-ball massage and periodic piriformis stretching to relieve pain at work.  Pt. feels she may be able to get stand up desk in near future as she notes prolonged sitting causes her the most pain.  Today's treatment with continued manual DTM/TPR to L buttocks due to this area still being pt. greatest concern.  No radicular pain today and low back pain minimal throughout treatment.  Pt. now able to sleep longer without disturbance from pain and able to sleep in B sidelying comfortably.  Traction progressed today due to pt. noting benefit with this.  Pt. will continue to benefit from continued skilled therapy to improve functional strength decrease pain with work related tasks.     PT Treatment/Interventions Patient/family education;Neuromuscular re-education;Therapeutic exercise;Therapeutic activities;Manual techniques;Dry needling;Taping;Electrical Stimulation;Moist Heat;Ultrasound;Cryotherapy;Traction;ADLs/Self Care Home Management   PT Next Visit Plan Kinesiotaping for SIJ +/- piriformis as benefit noted; Lumbar/core stabilization & proximal LE strengthening; Manual therapy & modalities PRN; assess response and continue mechanical traction if indicated      Patient will benefit from skilled therapeutic intervention in order to improve the following deficits and impairments:  Pain, Increased muscle spasms, Impaired flexibility, Postural dysfunction, Improper body mechanics, Decreased strength, Decreased activity tolerance  Visit Diagnosis: Chronic bilateral low back pain with  bilateral sciatica  Abnormal posture     Problem List Patient Active Problem List   Diagnosis Date Noted  . Sciatica 07/10/2014  . Low back pain 05/10/2012  . UNSPECIFIED MYALGIA AND MYOSITIS 04/28/2008  . Anxiety and depression 09/08/2006  . Depressive disorder, not elsewhere classified 09/08/2006  . ALLERGIC RHINITIS 09/06/2006  . INSOMNIA 09/06/2006    Kermit Balo, PTA 08/17/16 6:23 PM   [facility] [address]  [phone]  [place], [place], [address] Phone: [phone]   Fax:  [phone]  Name: SHERA LAUBACH MRN: [id number] Date of Birth: [birth year]

## 2016-08-22 ENCOUNTER — Ambulatory Visit: Payer: 59 | Admitting: Physical Therapy

## 2016-08-22 DIAGNOSIS — R293 Abnormal posture: Secondary | ICD-10-CM

## 2016-08-22 DIAGNOSIS — M5442 Lumbago with sciatica, left side: Principal | ICD-10-CM

## 2016-08-22 DIAGNOSIS — G8929 Other chronic pain: Secondary | ICD-10-CM

## 2016-08-22 DIAGNOSIS — M5441 Lumbago with sciatica, right side: Principal | ICD-10-CM

## 2016-08-22 NOTE — Therapy (Addendum)
[facility] [address]  [place], [place], [address] Phone: [phone]   Fax:  [phone]  Physical Therapy Treatment  Patient Details  Name: [name] [name] MRN: [id number] Date of Birth: Mar 21, 1976 Referring Provider: Karlton Lemon, MD  Encounter Date: 08/22/2016      PT End of Session - 08/22/16 1700    Visit Number 8   Number of Visits 12   Date for PT Re-Evaluation 08/26/16   Authorization Type UHC   Authorization - Number of Visits 40   PT Start Time 1700   PT Stop Time 1802   PT Time Calculation (min) 62 min   Activity Tolerance Patient tolerated treatment well   Behavior During Therapy [facility] for tasks assessed/performed      Past Medical History:  Diagnosis Date  . Allergy   . Anal fissure   . Anxiety   . Depression   . External hemorrhoids     Past Surgical History:  Procedure Laterality Date  . COLONOSCOPY N/A 12/14/2012   Procedure: COLONOSCOPY;  Surgeon: Lafayette Dragon, MD;  Location: [facility];  Service: Endoscopy;  Laterality: N/A;  . WISDOM TOOTH EXTRACTION      There were no vitals filed for this visit.      Subjective Assessment - 08/22/16 1704    Subjective Pt feels like injections have finally kicked in over the weekend and pain is much milder today. Notes being more aware of posture, esp how she is sitting. Able to ride to beach this weekend, noting pain in last hour of 4 hour ride.   Patient Stated Goals "Get rid of the dull ache and strengthen my core. Sleep better."   Currently in Pain? Yes   Pain Score 1    Pain Location Back   Pain Orientation Lower   Pain Descriptors / Indicators Discomfort   Pain Type Acute pain   Pain Radiating Towards L posterior thigh                         [facility] Adult PT Treatment/Exercise - 08/22/16 1700      Lumbar Exercises: Stretches   Piriformis Stretch 30 seconds;2 reps   Piriformis Stretch Limitations seated  figure 4 & KTOS     Lumbar Exercises: Aerobic   Stationary Bike L3 x 6'      Lumbar Exercises: Standing   Wall Slides 10 reps;5 seconds   Wall Slides Limitations + hip adduction ball squeeze   Other Standing Lumbar Exercises B sidestepping & fwd/back monster walk with looped red TB 2 x 20f     Lumbar Exercises: Seated   Hip Flexion on Ball Both;15 reps   Hip Flexion on Ball Limitations + alt UE lift    Other Seated Lumbar Exercises B pallof press with double red TB - seated on green Pball x15 each     Modalities   Modalities Electrical Stimulation;Moist Heat     Moist Heat Therapy   Number Minutes Moist Heat 15 Minutes   Moist Heat Location Lumbar Spine     Electrical Stimulation   Electrical Stimulation Location Lumbar spine  & L piriformis   Electrical Stimulation Action IFC   Electrical Stimulation Parameters 80-_0 , intensity to pt tolerance x15"   Electrical Stimulation Goals Pain     Manual Therapy   Manual Therapy Soft tissue mobilization;Myofascial release   Manual therapy comments pt in prone & R sidelying   Soft  tissue mobilization STM/DTM to L prirformis    Myofascial Release Roller stick to R HS                  PT Short Term Goals - 08/08/16 1744      PT SHORT TERM GOAL #1   Title Independent with initial HEP by 07/25/16   Status Achieved     PT SHORT TERM GOAL #2   Title Pt will verbalize understanding of neutral spine posture and proper body mechanics to reduce lumbar strain during daily tasks by 07/25/16   Status Achieved           PT Long Term Goals - 07/13/16 0900      PT LONG TERM GOAL #1   Title Independent with advanced HEP by 08/26/16   Status On-going     PT LONG TERM GOAL #2   Title B hip and knee strength >/= 4/5 by 08/26/16   Status On-going     PT LONG TERM GOAL #3   Title Pt will tolerate sitting at her desk for >/= 30 minutes w/o limitation d/t LBP or radicular pain by 08/26/16   Status On-going     PT LONG TERM  GOAL #4   Title Pt will report able to sleep >/= 4 hrs w/o limitations due to LBP or leg pain by 08/26/16   Status On-going               Plan - 08/22/16 1709    Clinical Impression Statement Pt reporting continued improvement with decreasing frequency and intensity of LBP and radicular symptoms. Re-assess of SIJ alignment today reveals neutral alignment but slight increased muscle tensions persists in L piriformis, which when pressure applied to this area reproduces her radicular symptoms. Continued manual therapy to address this followed by hip and core strengthening with treatment concluded with IFC estim and moist heat to promote further muscle relaxation.   Rehab Potential Good   PT Treatment/Interventions Patient/family education;Neuromuscular re-education;Therapeutic exercise;Therapeutic activities;Manual techniques;Dry needling;Taping;Electrical Stimulation;Moist Heat;Ultrasound;Cryotherapy;Traction;ADLs/Self Care Home Management   PT Next Visit Plan Kinesiotaping for SIJ +/- piriformis as benefit noted; Lumbar/core stabilization & proximal LE strengthening; Manual therapy & modalities PRN; continue mechanical traction if indicated   Consulted and Agree with Plan of Care Patient      Patient will benefit from skilled therapeutic intervention in order to improve the following deficits and impairments:  Pain, Increased muscle spasms, Impaired flexibility, Postural dysfunction, Improper body mechanics, Decreased strength, Decreased activity tolerance  Visit Diagnosis: Chronic bilateral low back pain with bilateral sciatica  Abnormal posture     Problem List Patient Active Problem List   Diagnosis Date Noted  . Sciatica 07/10/2014  . Low back pain 05/10/2012  . UNSPECIFIED MYALGIA AND MYOSITIS 04/28/2008  . Anxiety and depression 09/08/2006  . Depressive disorder, not elsewhere classified 09/08/2006  . ALLERGIC RHINITIS 09/06/2006  . INSOMNIA 09/06/2006    Percival Spanish, PT, MPT 08/22/2016, 6:18 PM  [facility] [address]  [place], [place], [address] Phone: [phone]   Fax:  [phone]  Name: [name] [name] MRN: [id number] Date of Birth: November 02, 1975  PHYSICAL THERAPY DISCHARGE SUMMARY  Visits from Start of Care: 8  Current functional level related to goals / functional outcomes:   Refer to above clinical impression for status as of last visit on 08/22/16. Unable to assess status at discharge as pt has not returned in >30 days.   Remaining  deficits:   As above.   Education / Equipment:   HEP, self-STM, posture and body mechanics education Plan: Patient agrees to discharge.  Patient goals were partially met. Patient is being discharged due to not returning since the last visit.  ?????     Percival Spanish, PT, MPT 10/04/16, 9:06 AM  [facility] [address]  [place], [place], [address] Phone: [phone]   Fax:  [phone]

## 2016-08-24 ENCOUNTER — Ambulatory Visit: Payer: 59

## 2016-09-05 ENCOUNTER — Ambulatory Visit: Payer: 59 | Admitting: Family Medicine

## 2016-10-30 ENCOUNTER — Other Ambulatory Visit: Payer: Self-pay | Admitting: Family Medicine

## 2016-12-19 ENCOUNTER — Telehealth: Payer: Self-pay | Admitting: Family Medicine

## 2016-12-19 NOTE — Telephone Encounter (Signed)
Patient would like another ESI  She called to see if an order can be placed or if a follow-up appointment needs to be scheduled first.

## 2016-12-19 NOTE — Telephone Encounter (Signed)
Spoke to patient and advised will set up for ESIs.

## 2016-12-19 NOTE — Telephone Encounter (Signed)
If she's experiencing similar pain it's ok to go ahead with bilateral S1 nerve root blocks and ESIs.  Thanks!

## 2016-12-22 ENCOUNTER — Other Ambulatory Visit: Payer: Self-pay | Admitting: Family Medicine

## 2016-12-22 DIAGNOSIS — M5416 Radiculopathy, lumbar region: Secondary | ICD-10-CM

## 2016-12-26 ENCOUNTER — Ambulatory Visit
Admission: RE | Admit: 2016-12-26 | Discharge: 2016-12-26 | Disposition: A | Discharge status: Home / Self Care | Payer: 59 | Source: Ambulatory Visit | Attending: Family Medicine | Admitting: Family Medicine

## 2016-12-26 DIAGNOSIS — M5416 Radiculopathy, lumbar region: Secondary | ICD-10-CM

## 2016-12-26 MED ORDER — IOPAMIDOL (ISOVUE-M 200) INJECTION 41%
1.0000 mL | Freq: Once | INTRAMUSCULAR | Status: AC
  Administered 2016-12-26: 1 mL via EPIDURAL

## 2016-12-26 MED ORDER — METHYLPREDNISOLONE ACETATE 40 MG/ML INJ SUSP (RADIOLOG
120.0000 mg | Freq: Once | INTRAMUSCULAR | Status: AC
  Administered 2016-12-26: 120 mg via EPIDURAL

## 2016-12-26 NOTE — Discharge Instructions (Signed)

## 2016-12-27 ENCOUNTER — Other Ambulatory Visit: Payer: Self-pay | Admitting: Family Medicine

## 2016-12-27 DIAGNOSIS — M5416 Radiculopathy, lumbar region: Secondary | ICD-10-CM

## 2017-01-09 ENCOUNTER — Other Ambulatory Visit: Payer: 59

## 2017-01-30 ENCOUNTER — Other Ambulatory Visit: Payer: Self-pay | Admitting: Family Medicine

## 2017-02-15 ENCOUNTER — Ambulatory Visit (INDEPENDENT_AMBULATORY_CARE_PROVIDER_SITE_OTHER): Payer: 59 | Admitting: Family

## 2017-02-15 ENCOUNTER — Ambulatory Visit (INDEPENDENT_AMBULATORY_CARE_PROVIDER_SITE_OTHER): Payer: 59 | Admitting: Family Medicine

## 2017-02-15 ENCOUNTER — Encounter: Payer: Self-pay | Admitting: Family Medicine

## 2017-02-15 ENCOUNTER — Telehealth: Payer: Self-pay | Admitting: Family

## 2017-02-15 ENCOUNTER — Encounter: Payer: Self-pay | Admitting: Family

## 2017-02-15 VITALS — BP 125/74 | HR 82 | Temp 99.1°F | Resp 16 | Ht 64.0 in | Wt 150.4 lb

## 2017-02-15 DIAGNOSIS — F418 Other specified anxiety disorders: Secondary | ICD-10-CM

## 2017-02-15 DIAGNOSIS — M2550 Pain in unspecified joint: Secondary | ICD-10-CM | POA: Diagnosis not present

## 2017-02-15 DIAGNOSIS — Z833 Family history of diabetes mellitus: Secondary | ICD-10-CM | POA: Diagnosis not present

## 2017-02-15 DIAGNOSIS — G8929 Other chronic pain: Secondary | ICD-10-CM | POA: Diagnosis not present

## 2017-02-15 DIAGNOSIS — M5442 Lumbago with sciatica, left side: Secondary | ICD-10-CM | POA: Diagnosis not present

## 2017-02-15 DIAGNOSIS — M5441 Lumbago with sciatica, right side: Secondary | ICD-10-CM | POA: Diagnosis not present

## 2017-02-15 LAB — SEDIMENTATION RATE: SED RATE: 1 mm/h (ref 0–20)

## 2017-02-15 LAB — HEMOGLOBIN A1C: Hgb A1c MFr Bld: 5.5 % (ref 4.6–6.5)

## 2017-02-15 MED ORDER — GABAPENTIN 300 MG PO CAPS
300.0000 mg | ORAL_CAPSULE | Freq: Three times a day (TID) | ORAL | 1 refills | Status: DC
Start: 2017-02-15 — End: 2019-02-26

## 2017-02-15 MED ORDER — SERTRALINE HCL 100 MG PO TABS: 100.0000 mg | ORAL_TABLET | Freq: Two times a day (BID) | ORAL | 1 refills | Status: DC

## 2017-02-15 MED ORDER — CALCIUM CARBONATE-VITAMIN D 600-400 MG-UNIT PO TABS: 1.0000 | ORAL_TABLET | Freq: Every day | ORAL | Status: DC

## 2017-02-15 MED ORDER — TRAZODONE HCL 100 MG PO TABS: 100.0000 mg | ORAL_TABLET | Freq: Every day | ORAL | 1 refills | Status: DC

## 2017-02-15 NOTE — Progress Notes (Signed)
Subjective:    Patient ID: [name] [name], female    DOB: 1976/03/18, 41 y.o.   MRN: [id number]  HPI  [name] is a 41 yr old female who presents today for follow up. She was following with psychiatry but they are out of network. Reports that her anxiety/depression symptoms remain well controlled.   Using trazodone for sleep which is helping.   Reports intermittent joint pains. Thinks sister has a positive HLA-b27  Wants autoimmune testing.  Also requesting A1C due to family hx of DM2 Review of Systems See HPI  Past Medical History:  Diagnosis Date  . Allergy   . Anal fissure   . Anxiety   . Depression   . External hemorrhoids      Social History   Social History  . Marital status: Single    Spouse name: N/A  . Number of children: 0  . Years of education: N/A   Occupational History  .  [employer]   Social History Main Topics  . Smoking status: Former Smoker    Quit date: 05/02/2001  . Smokeless tobacco: Never Used  . Alcohol use Yes     Comment: 1-2 beers a day  . Drug use: No     Comment: 1-2 times daily  . Sexual activity: Not on file   Other Topics Concern  . Not on file   Social History Narrative  . No narrative on file    Past Surgical History:  Procedure Laterality Date  . COLONOSCOPY N/A 12/14/2012   Procedure: COLONOSCOPY;  Surgeon: Lafayette Dragon, MD;  Location: [facility];  Service: Endoscopy;  Laterality: N/A;  . WISDOM TOOTH EXTRACTION      Family History  Problem Relation Age of Onset  . Protein C deficiency Sister   . Deep vein thrombosis Sister   . Pulmonary embolism Sister   . Heart disease Paternal Grandmother   . Colitis Mother   . Basal cell carcinoma Mother   . Colon cancer Neg Hx   . Cancer Neg Hx   . Drug abuse Neg Hx   . Stroke Neg Hx     No Known Allergies  Current Outpatient Prescriptions on File Prior to Visit  Medication Sig Dispense Refill  . cetirizine (ZYRTEC) 10 MG tablet Take 10 mg by mouth daily.        . folic acid (FOLVITE) 1 MG tablet Take 1 mg by mouth daily.    Marland Kitchen gabapentin (NEURONTIN) 300 MG capsule Take 1 capsule (300 mg total) by mouth 2 (two) times daily. 60 capsule 2  . meloxicam (MOBIC) 7.5 MG tablet TAKE 1-2 TABLET BY MOUTH EVERY DAY 15 tablet 0  . methocarbamol (ROBAXIN) 500 MG tablet Take 1 tablet (500 mg total) by mouth every 8 (eight) hours as needed. 60 tablet 1  . sertraline (ZOLOFT) 100 MG tablet Take 100 mg by mouth 2 (two) times daily.    . traZODone (DESYREL) 100 MG tablet Take 100 mg by mouth at bedtime.    . triamcinolone (KENALOG) 0.147 MG/GM topical spray Apply topically daily. To dry scalp     No current facility-administered medications on file prior to visit.     BP 125/74 (BP Location: Right Arm, Cuff Size: Normal)   Pulse 82   Temp 99.1 F (37.3 C) (Oral)   Resp 16   Ht 5' 4"  (1.626 m)   Wt 150 lb 6.4 oz (68.2 kg)   SpO2 99%   BMI 25.82 kg/m  Objective:   Physical Exam  Constitutional: She is oriented to person, place, and time. She appears well-developed and well-nourished.  HENT:  Head: Normocephalic and atraumatic.  Cardiovascular: Normal rate and regular rhythm.   No murmur heard. Pulmonary/Chest: Effort normal and breath sounds normal. No respiratory distress. She has no wheezes.  Neurological: She is alert and oriented to person, place, and time.  Psychiatric: She has a normal mood and affect. Her behavior is normal. Judgment and thought content normal.          Assessment & Plan:  Joint pain- obtain ANA, ESR, Rheumatoid factor for further evaluation.  Depression/anxiety- controlled on zoloft/trazodone. Continue same.  Family hx of DM2- requests A1C.

## 2017-02-15 NOTE — Telephone Encounter (Signed)
Ok as long as it is not back to back with a new pt/hosp follow up or cpx. Tks.

## 2017-02-15 NOTE — Patient Instructions (Addendum)
Do the home exercises and stretches regularly. Advance to doing the gabapentin twice a day then after a few days increase to 3 times a day. Call me in a month (or even sooner) if you want to go up on this medicine.

## 2017-02-15 NOTE — Telephone Encounter (Signed)
Per AVS pt is to come back in in 4 weeks however Im not showing anything within time frame. Pt says that she is concerned about her A1C but also need CPE within 4 week timeframe. Is it okay to schedule pt CPE outside of PCP current template.

## 2017-02-15 NOTE — Telephone Encounter (Signed)
Great. Pt has been scheduled.  °

## 2017-02-16 ENCOUNTER — Encounter: Payer: Self-pay | Admitting: Family

## 2017-02-16 LAB — ANA: ANA: NEGATIVE

## 2017-02-16 LAB — RHEUMATOID FACTOR: Rhuematoid fact SerPl-aCnc: <14 IU/mL (ref <14)

## 2017-02-21 NOTE — Progress Notes (Signed)
PCP: Sandford Craze, NP Consultation requested by: Esperanza Richters PA-C  Subjective:   HPI: Patient is a 41 y.o. female here for low back pain.  2/20: Patient reports she's had several years of low back pain dating back to when she was a teenager. No acute injury or trauma. 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 Difficulty lying on the right side. Associated numbness into legs. Pain into both legs also at 3/10 level, achy;  Pain in back is 5/10 level. Better with movement and driving. Has tried physical therapy remotely, chiropractic care in the past, mobic, flexeril. No bowel/bladder dysfunction.  10/17: Patient reports she's continued to have pain in low back going into left leg. Associated with pain into the knee area worse with flexion. No swelling, skin changes. Last two weeks has started doing home exercises more. Pain level is 5/10, achy. Tried tylenol, ibuprofen, is still taking gabapentin. No bowel/bladder dysfunction.  Past Medical History:  Diagnosis Date  . Allergy   . Anal fissure   . Anxiety   . Depression   . External hemorrhoids     Current Outpatient Prescriptions on File Prior to Visit  Medication Sig Dispense Refill  . cetirizine (ZYRTEC) 10 MG tablet Take 10 mg by mouth daily.      . folic acid (FOLVITE) 1 MG tablet Take 1 mg by mouth daily.    . meloxicam (MOBIC) 7.5 MG tablet TAKE 1-2 TABLET BY MOUTH EVERY DAY 15 tablet 0  . methocarbamol (ROBAXIN) 500 MG tablet Take 1 tablet (500 mg total) by mouth every 8 (eight) hours as needed. 60 tablet 1  . triamcinolone (KENALOG) 0.147 MG/GM topical spray Apply topically daily. To dry scalp     No current facility-administered medications on file prior to visit.     Past Surgical History:  Procedure Laterality Date  . COLONOSCOPY N/A 12/14/2012   Procedure: COLONOSCOPY;  Surgeon: Hart Carwin, MD;  Location: [facility];  Service: Endoscopy;  Laterality: N/A;  . WISDOM TOOTH EXTRACTION       No Known Allergies  Social History   Social History  . Marital status: Single    Spouse name: N/A  . Number of children: 0  . Years of education: N/A   Occupational History  .  [employer]   Social History Main Topics  . Smoking status: Former Smoker    Quit date: 05/02/2001  . Smokeless tobacco: Never Used  . Alcohol use Yes     Comment: 1-2 beers a day  . Drug use: No     Comment: 1-2 times daily  . Sexual activity: Not on file   Other Topics Concern  . Not on file   Social History Narrative  . No narrative on file    Family History  Problem Relation Age of Onset  . Protein C deficiency Sister   . Deep vein thrombosis Sister   . Pulmonary embolism Sister   . Heart disease Paternal Grandmother   . Colitis Mother   . Basal cell carcinoma Mother   . Colon cancer Neg Hx   . Cancer Neg Hx   . Drug abuse Neg Hx   . Stroke Neg Hx     BP 121/77   Pulse 75   Ht 5\' 4"  (1.626 m)   Wt 150 lb (68 kg)   BMI 25.75 kg/m   Review of Systems: See HPI above.     Objective:  Physical Exam:  Gen: NAD, comfortable  in exam room.  Back: No gross deformity, scoliosis. TTP bilaterally piriformis, lumbar paraspinal region.  No midline or bony TTP. FROM without pain. Strength LEs 5/5 all muscle groups.   3+ MSRs in patellar and 2+ achilles tendons, equal bilaterally. Negative SLRs. Sensation intact to light touch bilaterally. Positive piriformis bilaterally mildly.  Negative fabers.  Right hip: No gross deformity, swelling. FROM without pain. 5/5 strength with flexion.  Left hip: No gross deformity, swelling. FROM without pain. 5/5 strength with flexion.   Assessment & Plan:  1. 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  She has tried ESIs and most recent one didn't provide much benefit.  Did PT, chiropractic care, NSAIDs, muscle relaxants in past.  Discussed options - she would like to continue to titrate gabapentin up.  Have  plenty of room to go up on this medicine.  Consider neurosurgery referral, revisiting PT.

## 2017-02-21 NOTE — Assessment & Plan Note (Signed)
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  She has tried ESIs and most recent one didn't provide much benefit.  Did PT, chiropractic care, NSAIDs, muscle relaxants in past.  Discussed options - she would like to continue to titrate gabapentin up.  Have plenty of room to go up on this medicine.  Consider neurosurgery referral, revisiting PT.

## 2017-03-26 NOTE — Progress Notes (Signed)
Subjective:    Patient ID: [name] [name], female    DOB: [birth year], 41 y.o.   MRN: [id number]  HPI   Pt is a 41 yr old female here today for cpx.  Immunizations: Td 2011 Diet: fair diet, trying to cut back on sweets/breads etc.   Exercise: not regularly Pap Smear: 05/02/14- Dr. Estanislado Pandyivard Mammogram: will be due in the end of February when she turns [age] Vision:  Up to date, healthy, wearing computer glasses Dental: up to date Wt Readings from Last 3 Encounters:  03/27/17 146 lb 6.4 oz (66.4 kg)  02/15/17 150 lb (68 kg)  02/15/17 150 lb 6.4 oz (68.2 kg)        Review of Systems  Constitutional: Negative for unexpected weight change.  HENT: Positive for rhinorrhea.   Eyes: Negative for visual disturbance.  Respiratory: Negative for cough.   Cardiovascular: Negative for leg swelling.  Gastrointestinal:       Occasional constipation alternating with diarrhea, uses miralax prn  Genitourinary: Negative for dysuria and frequency.  Musculoskeletal:       Has some DDD  With radicular pain down the left leg- following with Dr. Pearletha ForgeHudnall.  Doing stretching exercises  Skin: Negative for rash.  Neurological: Negative for headaches.  Hematological:       Reports ? Mild cervical LAD due to URI  Psychiatric/Behavioral:       Denies depression/anxiety symptoms   Past Medical History:  Diagnosis Date  . Allergy   . Anal fissure   . Anxiety   . Depression   . External hemorrhoids      Social History   Socioeconomic History  . Marital status: Single    Spouse name: Not on file  . Number of children: 0  . Years of education: Not on file  . Highest education level: Not on file  Social Needs  . Financial resource strain: Not on file  . Food insecurity - worry: Not on file  . Food insecurity - inability: Not on file  . Transportation needs - medical: Not on file  . Transportation needs - non-medical: Not on file  Occupational History    Employer: [employer]  Tobacco Use  .  Smoking status: Former Smoker    Last attempt to quit: 05/02/2001    Years since quitting: 15.9  . Smokeless tobacco: Never Used  Substance and Sexual Activity  . Alcohol use: Yes    Comment: 1-2 beers a day  . Drug use: No    Comment: 1-2 times daily  . Sexual activity: Not on file  Other Topics Concern  . Not on file  Social History Narrative   Works at [employer]   Lives with boyfriend   No children     Past Surgical History:  Procedure Laterality Date  . COLONOSCOPY N/A 12/14/2012   Procedure: COLONOSCOPY;  Surgeon: Hart Carwinora M Brodie, MD;  Location: [facility];  Service: Endoscopy;  Laterality: N/A;  . WISDOM TOOTH EXTRACTION      Family History  Problem Relation Age of Onset  . Protein C deficiency Sister   . Deep vein thrombosis Sister   . Pulmonary embolism Sister   . Other Sister        pre-diabetes  . Heart disease Paternal Grandmother   . Colitis Mother   . Basal cell carcinoma Mother   . Other Brother        pre-diabetes  . Other Sister        pre-diabetes  .  Colon cancer Neg Hx   . Cancer Neg Hx   . Drug abuse Neg Hx   . Stroke Neg Hx     No Known Allergies  Current Outpatient Medications on File Prior to Visit  Medication Sig Dispense Refill  . Calcium Carbonate-Vitamin D (CALTRATE 600+D) 600-400 MG-UNIT tablet Take 1 tablet by mouth daily.    . cetirizine (ZYRTEC) 10 MG tablet Take 10 mg by mouth daily.      . Cholecalciferol (VITAMIN D3 PO) Take 25 mcg by mouth daily.    . folic acid (FOLVITE) 1 MG tablet Take 1 mg by mouth daily.    Marland Kitchen. gabapentin (NEURONTIN) 300 MG capsule Take 1 capsule (300 mg total) by mouth 3 (three) times daily. 90 capsule 1  . OMEGA-3 KRILL OIL PO Take 1 capsule by mouth daily.    . sertraline (ZOLOFT) 100 MG tablet Take 1 tablet (100 mg total) by mouth 2 (two) times daily. 180 tablet 1  . traZODone (DESYREL) 100 MG tablet Take 1 tablet (100 mg total) by mouth at bedtime. 90 tablet 1  . triamcinolone (KENALOG) 0.147 MG/GM  topical spray Apply topically daily. To dry scalp     No current facility-administered medications on file prior to visit.     BP 119/68 (BP Location: Right Arm, Cuff Size: Normal)   Pulse 74   Temp 98.5 F (36.9 C) (Oral)   Resp 16   Ht 5\' 4"  (1.626 m)   Wt 146 lb 6.4 oz (66.4 kg)   LMP 03/04/2017   BMI 25.13 kg/m       Objective:   Physical Exam  Physical Exam  Constitutional: She is oriented to person, place, and time. She appears well-developed and well-nourished. No distress.  HENT:  Head: Normocephalic and atraumatic.  Right Ear: Tympanic membrane and ear canal normal.  Left Ear: Tympanic membrane and ear canal normal.  Mouth/Throat: Oropharynx is clear and moist.  Eyes: Pupils are equal, round, and reactive to light. No scleral icterus.  Neck: Normal range of motion. No thyromegaly present.  Cardiovascular: Normal rate and regular rhythm.   No murmur heard. Pulmonary/Chest: Effort normal and breath sounds normal. No respiratory distress. He has no wheezes. She has no rales. She exhibits no tenderness.  Abdominal: Soft. Bowel sounds are normal. She exhibits no distension and no mass. There is no tenderness. There is no rebound and no guarding.  Musculoskeletal: She exhibits no edema.  Lymphadenopathy:    She has no cervical adenopathy.  Neurological: She is alert and oriented to person, place, and time. She has normal patellar reflexes. She exhibits normal muscle tone. Coordination normal.  Skin: Skin is warm and dry.  Psychiatric: She has a normal mood and affect. Her behavior is normal. Judgment and thought content normal.  Breasts: Examined lying Right: Without masses, retractions, discharge or axillary adenopathy.  Left: Without masses, retractions, discharge or axillary adenopathy.  Pelvic: deferred to GYN          Assessment & Plan:         Assessment & Plan:  Preventative Care- discussed healthy diet, regular exercise.  She will get mammogram with  GYN after the new year when she turns 42. Obtain routine lab work.  Flu shot today.

## 2017-03-27 ENCOUNTER — Ambulatory Visit (INDEPENDENT_AMBULATORY_CARE_PROVIDER_SITE_OTHER): Payer: 59 | Admitting: Family

## 2017-03-27 ENCOUNTER — Encounter: Payer: Self-pay | Admitting: Family

## 2017-03-27 VITALS — BP 119/68 | HR 74 | Temp 98.5°F | Resp 16 | Ht 64.0 in | Wt 146.4 lb

## 2017-03-27 DIAGNOSIS — Z23 Encounter for immunization: Secondary | ICD-10-CM | POA: Diagnosis not present

## 2017-03-27 DIAGNOSIS — Z0000 Encounter for general adult medical examination without abnormal findings: Secondary | ICD-10-CM | POA: Diagnosis not present

## 2017-03-27 LAB — BASIC METABOLIC PANEL
BUN: 20 mg/dL (ref 6–23)
CHLORIDE: 103 meq/L (ref 96–112)
CO2: 30 mEq/L (ref 19–32)
Calcium: 10.1 mg/dL (ref 8.4–10.5)
Creatinine, Ser: 1.03 mg/dL (ref 0.40–1.20)
GFR: 63.16 mL/min (ref >60.00)
Glucose, Bld: 97 mg/dL (ref 70–99)
POTASSIUM: 4.7 meq/L (ref 3.5–5.1)
SODIUM: 140 meq/L (ref 135–145)

## 2017-03-27 LAB — URINALYSIS, ROUTINE W REFLEX MICROSCOPIC
BILIRUBIN URINE: NEGATIVE
HGB URINE DIPSTICK: NEGATIVE
KETONES UR: NEGATIVE
Leukocytes, UA: NEGATIVE
Nitrite: NEGATIVE
RBC / HPF: NONE SEEN (ref 0–?)
Specific Gravity, Urine: 1.01 (ref 1.000–1.030)
Total Protein, Urine: NEGATIVE
URINE GLUCOSE: NEGATIVE
UROBILINOGEN UA: 0.2 (ref 0.0–1.0)
WBC, UA: NONE SEEN (ref 0–?)
pH: 7 (ref 5.0–8.0)

## 2017-03-27 LAB — LIPID PANEL
CHOL/HDL RATIO: 3
Cholesterol: 226 mg/dL — ABNORMAL HIGH (ref 0–200)
HDL: 85.8 mg/dL (ref >39.00)
LDL CALC: 129 mg/dL — AB (ref 0–99)
NonHDL: 140.3
TRIGLYCERIDES: 56 mg/dL (ref 0.0–149.0)
VLDL: 11.2 mg/dL (ref 0.0–40.0)

## 2017-03-27 LAB — CBC WITH DIFFERENTIAL/PLATELET
BASOS PCT: 0.5 % (ref 0.0–3.0)
Basophils Absolute: 0 10*3/uL (ref 0.0–0.1)
EOS PCT: 4.7 % (ref 0.0–5.0)
Eosinophils Absolute: 0.4 10*3/uL (ref 0.0–0.7)
HEMATOCRIT: 40.5 % (ref 36.0–46.0)
HEMOGLOBIN: 13.3 g/dL (ref 12.0–15.0)
LYMPHS PCT: 25.5 % (ref 12.0–46.0)
Lymphs Abs: 2.2 10*3/uL (ref 0.7–4.0)
MCHC: 32.8 g/dL (ref 30.0–36.0)
MCV: 88.4 fl (ref 78.0–100.0)
MONO ABS: 0.7 10*3/uL (ref 0.1–1.0)
Monocytes Relative: 7.7 % (ref 3.0–12.0)
Neutro Abs: 5.4 10*3/uL (ref 1.4–7.7)
Neutrophils Relative %: 61.6 % (ref 43.0–77.0)
Platelets: 227 10*3/uL (ref 150.0–400.0)
RBC: 4.58 Mil/uL (ref 3.87–5.11)
RDW: 12.5 % (ref 11.5–15.5)
WBC: 8.8 10*3/uL (ref 4.0–10.5)

## 2017-03-27 LAB — HEPATIC FUNCTION PANEL
ALT: 15 U/L (ref 0–35)
AST: 19 U/L (ref 0–37)
Albumin: 4.8 g/dL (ref 3.5–5.2)
Alkaline Phosphatase: 36 U/L — ABNORMAL LOW (ref 39–117)
BILIRUBIN DIRECT: 0 mg/dL (ref 0.0–0.3)
BILIRUBIN TOTAL: 0.6 mg/dL (ref 0.2–1.2)
TOTAL PROTEIN: 7.3 g/dL (ref 6.0–8.3)

## 2017-03-27 LAB — TSH: TSH: 3.31 u[IU]/mL (ref 0.35–4.50)

## 2017-03-27 NOTE — Patient Instructions (Signed)
Please complete lab work prior to leaving. Continue to work on healthy diet and regular exercise.  

## 2017-03-28 ENCOUNTER — Encounter: Payer: Self-pay | Admitting: Family

## 2017-04-12 ENCOUNTER — Encounter: Payer: Self-pay | Admitting: Family

## 2017-04-27 ENCOUNTER — Other Ambulatory Visit: Payer: 59

## 2017-05-01 ENCOUNTER — Telehealth: Payer: Self-pay | Admitting: *Deleted

## 2017-05-01 NOTE — Telephone Encounter (Signed)
Melissa-- please see pt's mychart message. Last a1c was 02/15/17 so it is too soon to do now if it even needs repeating. Last a1c was 5.5. Pt is requesting to have it repeated again.  Please advise?

## 2017-05-01 NOTE — Telephone Encounter (Signed)
Received message from the lab that pt came in on 04/27/17 for labs but no orders had been placed in Epic. It appears that the appointment was made at her last appt for CPE on 03/27/17. Reviewed last OV and patient emails / results in Epic and do not see any notation that pt was due for any additional labs at this time. Sent mychart message to pt.

## 2017-05-01 NOTE — Telephone Encounter (Signed)
That's correct, too soon to repeat A1C.  Given her A1C level, no need to repeat for 6 months unless she prefers to repeat at 3 months.  OK with me if she wants to repeat in 3 months but insurance will not pay sooner.

## 2017-05-03 NOTE — Telephone Encounter (Signed)
Mychart response sent to pt. Awaiting reply.

## 2017-08-27 ENCOUNTER — Other Ambulatory Visit: Payer: Self-pay | Admitting: Family

## 2017-09-07 ENCOUNTER — Other Ambulatory Visit: Payer: Self-pay | Admitting: Family

## 2017-09-28 LAB — HM MAMMOGRAPHY

## 2017-10-03 ENCOUNTER — Other Ambulatory Visit: Payer: Self-pay | Admitting: Family Medicine

## 2017-11-07 ENCOUNTER — Ambulatory Visit (INDEPENDENT_AMBULATORY_CARE_PROVIDER_SITE_OTHER): Payer: 59 | Admitting: Family Medicine

## 2017-11-07 ENCOUNTER — Encounter: Payer: Self-pay | Admitting: Family Medicine

## 2017-11-07 VITALS — BP 112/77 | HR 68 | Ht 64.0 in | Wt 140.0 lb

## 2017-11-07 DIAGNOSIS — S6991XA Unspecified injury of right wrist, hand and finger(s), initial encounter: Secondary | ICD-10-CM

## 2017-11-07 DIAGNOSIS — S62512A Displaced fracture of proximal phalanx of left thumb, initial encounter for closed fracture: Secondary | ICD-10-CM | POA: Diagnosis not present

## 2017-11-07 NOTE — Patient Instructions (Signed)
You have a transverse fracture of your proximal phalanx of your thumb. Get x-rays on Thursday then follow up with us in the office - ok to put you on the schedule between patients. We will discuss next steps at that time depending on the amount of displacement. Ideally we leave this in place for 2 weeks then transition you to a thumb spica cast. Take ibuprofen three times a day with food with percocet as needed as you have been.

## 2017-11-08 ENCOUNTER — Encounter: Payer: Self-pay | Admitting: Family Medicine

## 2017-11-08 DIAGNOSIS — S6991XA Unspecified injury of right wrist, hand and finger(s), initial encounter: Secondary | ICD-10-CM | POA: Insufficient documentation

## 2017-11-08 NOTE — Progress Notes (Signed)
PCP: Sandford Craze, NP  Subjective:   HPI: Patient is a 42 y.o. female here for right thumb injury.  Patient [place] when she sustained injury to right thumb. Immediate pain, swelling, difficulty moving thumb. Went to ED and had x-rays showing transverse fracture of proximal phalanx of thumb. Has been taking ibuprofen 800 tid, percocet as needed. Pain level down to 4/10 and more dull in thumb spica splint. Intermittent numbness, some bruising. Right handed.  Past Medical History:  Diagnosis Date  . Allergy   . Anal fissure   . Anxiety   . Depression   . External hemorrhoids     Current Outpatient Medications on File Prior to Visit  Medication Sig Dispense Refill  . Calcium Carbonate-Vitamin D (CALTRATE 600+D) 600-400 MG-UNIT tablet Take 1 tablet by mouth daily.    . cetirizine (ZYRTEC) 10 MG tablet Take 10 mg by mouth daily.      . Cholecalciferol (VITAMIN D3 PO) Take 25 mcg by mouth daily.    . folic acid (FOLVITE) 1 MG tablet Take 1 mg by mouth daily.    Marland Kitchen gabapentin (NEURONTIN) 300 MG capsule Take 1 capsule (300 mg total) by mouth 3 (three) times daily. 90 capsule 1  . OMEGA-3 KRILL OIL PO Take 1 capsule by mouth daily.    . sertraline (ZOLOFT) 100 MG tablet TAKE 1 TABLET BY MOUTH TWICE A DAY 180 tablet 1  . traZODone (DESYREL) 100 MG tablet TAKE 1 TABLET BY MOUTH EVERYDAY AT BEDTIME 90 tablet 1  . triamcinolone (KENALOG) 0.147 MG/GM topical spray Apply topically daily. To dry scalp     No current facility-administered medications on file prior to visit.     Past Surgical History:  Procedure Laterality Date  . COLONOSCOPY N/A 12/14/2012   Procedure: COLONOSCOPY;  Surgeon: Hart Carwin, MD;  Location: [facility];  Service: Endoscopy;  Laterality: N/A;  . WISDOM TOOTH EXTRACTION      No Known Allergies  Social History   Socioeconomic History  . Marital status: Single    Spouse name: Not on  file  . Number of children: 0  . Years of education: Not on file  . Highest education level: Not on file  Occupational History    Employer: [employer]  Social Needs  . Financial resource strain: Not on file  . Food insecurity:    Worry: Not on file    Inability: Not on file  . Transportation needs:    Medical: Not on file    Non-medical: Not on file  Tobacco Use  . Smoking status: Former Smoker    Last attempt to quit: 05/02/2001    Years since quitting: 16.5  . Smokeless tobacco: Never Used  Substance and Sexual Activity  . Alcohol use: Yes    Comment: 1-2 beers a day  . Drug use: No    Types: Marijuana    Comment: 1-2 times daily  . Sexual activity: Not on file  Lifestyle  . Physical activity:    Days per week: Not on file    Minutes per session: Not on file  . Stress: Not on file  Relationships  . Social connections:    Talks on phone: Not on file    Gets together: Not on file    Attends religious service: Not on file    Active member of club or organization: Not on file    Attends meetings of  clubs or organizations: Not on file    Relationship status: Not on file  . Intimate partner violence:    Fear of current or ex partner: Not on file    Emotionally abused: Not on file    Physically abused: Not on file    Forced sexual activity: Not on file  Other Topics Concern  . Not on file  Social History Narrative   Works at [employer]   Lives with boyfriend   No children     Family History  Problem Relation Age of Onset  . Protein C deficiency Sister   . Deep vein thrombosis Sister   . Pulmonary embolism Sister   . Other Sister        pre-diabetes  . Heart disease Paternal Grandmother   . Colitis Mother   . Basal cell carcinoma Mother   . Other Brother        pre-diabetes  . Other Sister        pre-diabetes  . Colon cancer Neg Hx   . Cancer Neg Hx   . Drug abuse Neg Hx   . Stroke Neg Hx     BP 112/77   Pulse 68   Ht 5\' 4"  (1.626 m)   Wt 140 lb  (63.5 kg)   BMI 24.03 kg/m   Review of Systems: See HPI above.     Objective:  Physical Exam:  Gen: NAD, comfortable in exam room  Right thumb: Splint not removed today. Cap refill < 2 sec and sensation intact to light touch.  Assessment & Plan:  1. Right thumb injury - independently reviewed radiographs from 7/7 and noted transverse proximal phalanx fracture with about 30% displacement dorsally.  No lateral displacement or angulation noted.  Plan to bring back on 7/11 to repeat radiographs through thumb spica splint.  Ibuprofen with percocet as needed.

## 2017-11-08 NOTE — Assessment & Plan Note (Signed)
independently reviewed radiographs from 7/7 and noted transverse proximal phalanx fracture with about 30% displacement dorsally.  No lateral displacement or angulation noted.  Plan to bring back on 7/11 to repeat radiographs through thumb spica splint.  Ibuprofen with percocet as needed.

## 2017-11-09 ENCOUNTER — Ambulatory Visit: Payer: 59 | Admitting: Family Medicine

## 2018-02-13 IMAGING — XA Imaging study
2 series · 2 of 2 positions shown · non-contrast
Comparison: MRI 07/02/2016

CLINICAL DATA: Spondylosis without myelopathy. Degenerative disc
disease L4-5 and L5-S1. Low back pain with bilateral lower extremity
pain.

FLUOROSCOPY TIME:  1 minutes 30 seconds. 98.74 micro gray meter
squared

[Series 2: ortho standard · 1 of 1 slices shown (1 of 2)]
[im 1/1]
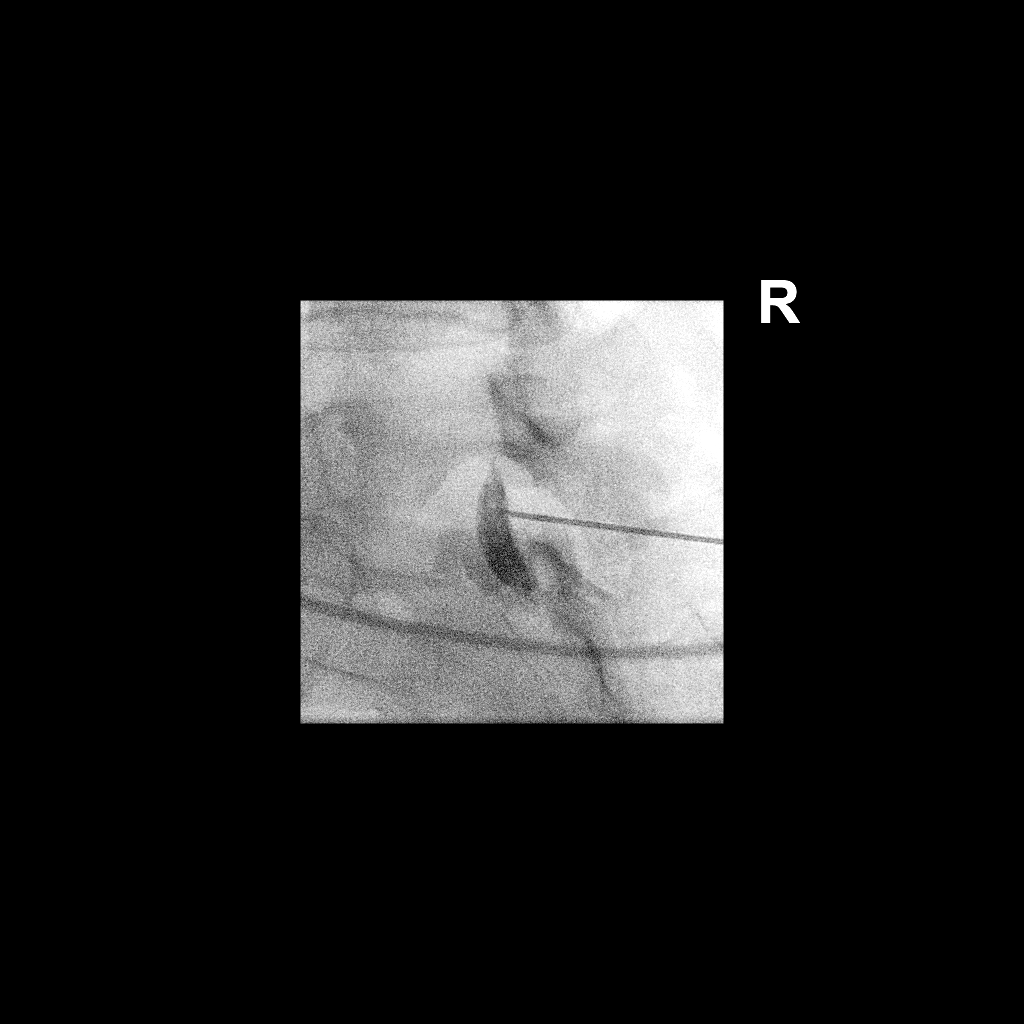

[Series 3: ortho standard · 1 of 1 slices shown (2 of 2)]
[im 1/1]
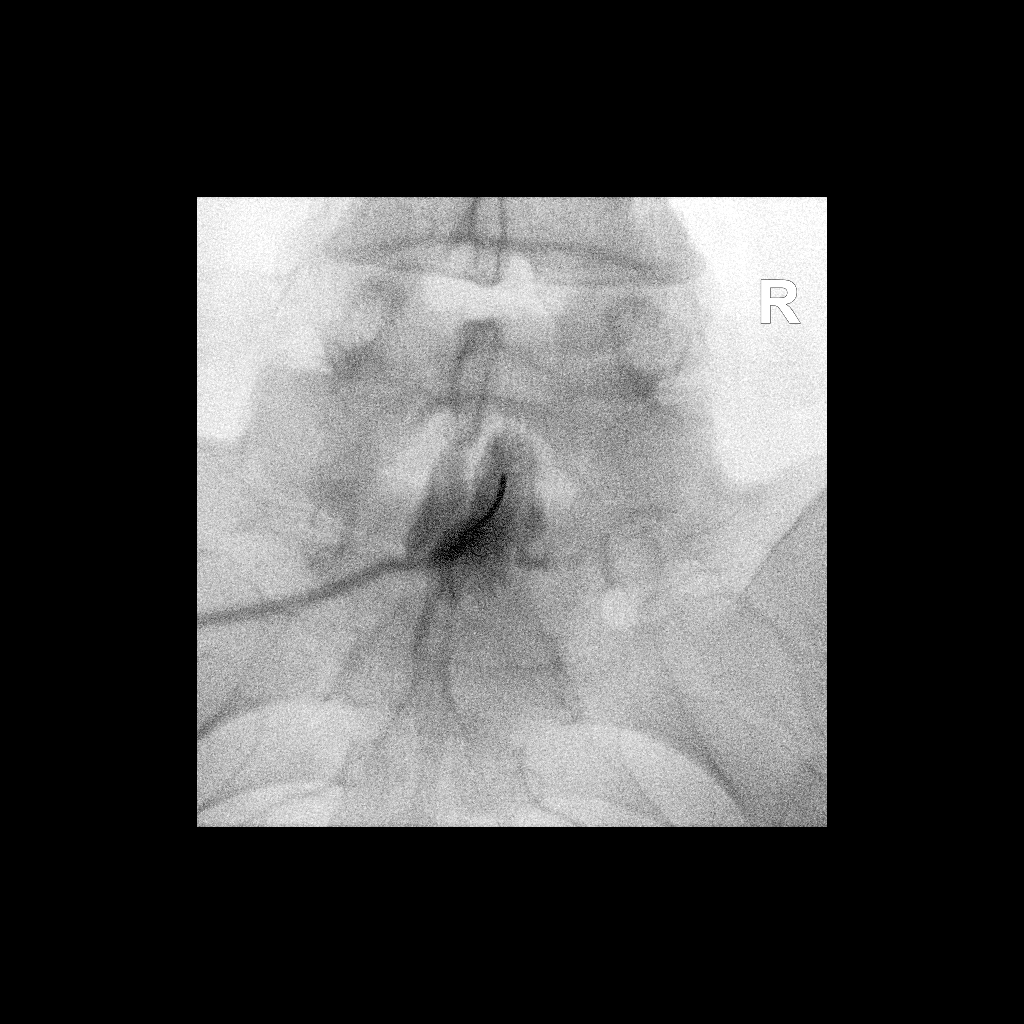

[2 of 2 positions shown; findings below may reference images not displayed]

PROCEDURE:
The procedure, risks, benefits, and alternatives were explained to
the patient. Questions regarding the procedure were encouraged and
answered. The patient understands and consents to the procedure.

LUMBAR EPIDURAL INJECTION:

An interlaminar approach was performed from the right in the midline
at L5-S1. The overlying skin was cleansed and anesthetized. A 20
gauge epidural needle was advanced using loss-of-resistance
technique.

DIAGNOSTIC EPIDURAL INJECTION:

Injection of Isovue-M 200 shows a good epidural pattern with spread
above and below the level of needle placement, extending freely to
both sides. No vascular opacification is seen.

THERAPEUTIC EPIDURAL INJECTION:

One hundred twenty Mg of Depo-Medrol mixed with 2.5 cc 1% lidocaine
were instilled. The procedure was well-tolerated, and the patient
was discharged thirty minutes following the injection in good
condition.

COMPLICATIONS:
None
IMPRESSION: Technically successful epidural injection in the midline at L5-S1
with spread to both sides # 1.

## 2018-03-05 ENCOUNTER — Other Ambulatory Visit: Payer: Self-pay | Admitting: Family

## 2018-03-05 NOTE — Telephone Encounter (Signed)
Sent 30 day supply of sertraline to pharmacy. Pt is due for annual physical 03/27/18 or after and needs to schedule OV.  Mychart message sent to pt.

## 2018-03-17 ENCOUNTER — Other Ambulatory Visit: Payer: Self-pay | Admitting: Family

## 2018-03-19 NOTE — Telephone Encounter (Signed)
[name] [name] -- please advise trazodone request?  Last OV 03/17/17 and has no future appts scheduled.

## 2018-03-20 NOTE — Telephone Encounter (Signed)
I sent a 30 day supply.  Needs follow up prior to additional refills.

## 2018-04-01 ENCOUNTER — Other Ambulatory Visit: Payer: Self-pay | Admitting: Family

## 2018-04-02 NOTE — Telephone Encounter (Signed)
[name] [name] -- pt has not been seen in over 1 yr. She has an appointment with you tomorrow. I am forwarding these to you to hold until she is seen in the office. Thanks!

## 2018-04-03 ENCOUNTER — Encounter: Payer: Self-pay | Admitting: Family

## 2018-04-03 ENCOUNTER — Ambulatory Visit (INDEPENDENT_AMBULATORY_CARE_PROVIDER_SITE_OTHER): Payer: 59 | Admitting: Family

## 2018-04-03 VITALS — BP 114/71 | HR 66 | Temp 98.8°F | Resp 16 | Ht 64.0 in | Wt 147.0 lb

## 2018-04-03 DIAGNOSIS — F419 Anxiety disorder, unspecified: Secondary | ICD-10-CM

## 2018-04-03 DIAGNOSIS — R21 Rash and other nonspecific skin eruption: Secondary | ICD-10-CM

## 2018-04-03 DIAGNOSIS — F329 Major depressive disorder, single episode, unspecified: Secondary | ICD-10-CM

## 2018-04-03 DIAGNOSIS — F32A Depression, unspecified: Secondary | ICD-10-CM

## 2018-04-03 DIAGNOSIS — Z23 Encounter for immunization: Secondary | ICD-10-CM | POA: Diagnosis not present

## 2018-04-03 MED ORDER — SERTRALINE HCL 100 MG PO TABS: 100.0000 mg | ORAL_TABLET | Freq: Two times a day (BID) | ORAL | 1 refills | Status: DC

## 2018-04-03 MED ORDER — BETAMETHASONE VALERATE 0.1 % EX OINT: 1.0000 "application " | TOPICAL_OINTMENT | Freq: Two times a day (BID) | CUTANEOUS | 0 refills | Status: DC

## 2018-04-03 NOTE — Progress Notes (Signed)
Subjective:    Patient ID: [name] [name], female    DOB: Sep 01, 1975, 42 y.o.   MRN: [id number]  HPI   Patient is a 42 year old female who presents today for follow-up of her anxiety and depression.  Her current medications include Zoloft 100 mg once daily and trazodone 100 mg at bedtime for sleep. Reports mood is good. Managing her stress well. Denies mood swings or panic attacks.   Sleeps well.  Rash- beneath nostrils and on her right lower back.     Review of Systems    see HPI  Past Medical History:  Diagnosis Date  . Allergy   . Anal fissure   . Anxiety   . Depression   . External hemorrhoids      Social History   Socioeconomic History  . Marital status: Single    Spouse name: Not on file  . Number of children: 0  . Years of education: Not on file  . Highest education level: Not on file  Occupational History    Employer: [employer]  Social Needs  . Financial resource strain: Not on file  . Food insecurity:    Worry: Not on file    Inability: Not on file  . Transportation needs:    Medical: Not on file    Non-medical: Not on file  Tobacco Use  . Smoking status: Former Smoker    Last attempt to quit: 05/02/2001    Years since quitting: 16.9  . Smokeless tobacco: Never Used  Substance and Sexual Activity  . Alcohol use: Yes    Comment: 1-2 beers a day  . Drug use: No    Types: Marijuana    Comment: 1-2 times daily  . Sexual activity: Not on file  Lifestyle  . Physical activity:    Days per week: Not on file    Minutes per session: Not on file  . Stress: Not on file  Relationships  . Social connections:    Talks on phone: Not on file    Gets together: Not on file    Attends religious service: Not on file    Active member of club or organization: Not on file    Attends meetings of clubs or organizations: Not on file    Relationship status: Not on file  . Intimate partner violence:    Fear of current or ex partner: Not on file    Emotionally  abused: Not on file    Physically abused: Not on file    Forced sexual activity: Not on file  Other Topics Concern  . Not on file  Social History Narrative   Works at [employer] with boyfriend   No children     Past Surgical History:  Procedure Laterality Date  . COLONOSCOPY N/A 12/14/2012   Procedure: COLONOSCOPY;  Surgeon: Hart Carwin, MD;  Location: [facility];  Service: Endoscopy;  Laterality: N/A;  . WISDOM TOOTH EXTRACTION      Family History  Problem Relation Age of Onset  . Protein C deficiency Sister   . Deep vein thrombosis Sister   . Pulmonary embolism Sister   . Other Sister        pre-diabetes  . Heart disease Paternal Grandmother   . Colitis Mother   . Basal cell carcinoma Mother   . Other Brother        pre-diabetes  . Other Sister        pre-diabetes  . Colon cancer Neg  Hx   . Cancer Neg Hx   . Drug abuse Neg Hx   . Stroke Neg Hx     No Known Allergies  Current Outpatient Medications on File Prior to Visit  Medication Sig Dispense Refill  . Calcium Carbonate-Vitamin D (CALTRATE 600+D) 600-400 MG-UNIT tablet Take 1 tablet by mouth daily.    . cetirizine (ZYRTEC) 10 MG tablet Take 10 mg by mouth daily.      . folic acid (FOLVITE) 1 MG tablet Take 1 mg by mouth daily.    Marland Kitchen. gabapentin (NEURONTIN) 300 MG capsule Take 1 capsule (300 mg total) by mouth 3 (three) times daily. 90 capsule 1  . OMEGA-3 KRILL OIL PO Take 1 capsule by mouth daily.    Marland Kitchen. pyridOXINE (VITAMIN B-6) 100 MG tablet Take 100 mg by mouth daily.    . sertraline (ZOLOFT) 100 MG tablet TAKE 1 TABLET BY MOUTH TWICE A DAY 60 tablet 0  . traZODone (DESYREL) 100 MG tablet TAKE 1 TABLET BY MOUTH EVERYDAY AT BEDTIME 30 tablet 0  . triamcinolone (KENALOG) 0.147 MG/GM topical spray Apply topically daily. To dry scalp     No current facility-administered medications on file prior to visit.     BP 114/71 (BP Location: Right Arm, Patient Position: Sitting, Cuff Size: Small)   Pulse 66    Temp 98.8 F (37.1 C) (Oral)   Resp 16   Ht 5\' 4"  (1.626 m)   Wt 147 lb (66.7 kg)   LMP 03/17/2018   SpO2 100%   BMI 25.23 kg/m    Objective:   Physical Exam  Constitutional: She is oriented to person, place, and time. She appears well-developed and well-nourished.  Neck: Neck supple. No thyromegaly present.  Cardiovascular: Normal rate, regular rhythm and normal heart sounds.  No murmur heard. Musculoskeletal: She exhibits no edema.  Neurological: She is alert and oriented to person, place, and time.  Skin: Skin is warm and dry.  Mild dry skin noted beneath both nares  Mild hyperpigmented annular rash noted on right lower back  Psychiatric: She has a normal mood and affect. Her behavior is normal. Judgment and thought content normal.          Assessment & Plan:  Skin rash-suspect eczema beneath nares.  Rx has been sent for betamethasone to use as needed the area on her lower back she has been applying hydrocortisone without improvement.  It could be fungal.  I have suggested that she purchase over-the-counter Lotrimin and apply twice daily.  Let me know if symptoms worsen or fail to improve.  Depression/anxiety-this is stable.  Continue current medications.

## 2018-04-24 ENCOUNTER — Other Ambulatory Visit: Payer: Self-pay | Admitting: Family

## 2018-07-09 ENCOUNTER — Ambulatory Visit (INDEPENDENT_AMBULATORY_CARE_PROVIDER_SITE_OTHER): Payer: 59 | Admitting: Family

## 2018-07-09 ENCOUNTER — Telehealth: Payer: Self-pay | Admitting: Family

## 2018-07-09 ENCOUNTER — Encounter: Payer: Self-pay | Admitting: Family

## 2018-07-09 VITALS — BP 104/67 | HR 71 | Temp 98.4°F | Resp 16 | Ht 64.0 in | Wt 146.0 lb

## 2018-07-09 DIAGNOSIS — Z0000 Encounter for general adult medical examination without abnormal findings: Secondary | ICD-10-CM

## 2018-07-09 LAB — HEPATIC FUNCTION PANEL
ALT: 13 U/L (ref 0–35)
AST: 16 U/L (ref 0–37)
Albumin: 4.6 g/dL (ref 3.5–5.2)
Alkaline Phosphatase: 38 U/L — ABNORMAL LOW (ref 39–117)
Bilirubin, Direct: 0.1 mg/dL (ref 0.0–0.3)
Total Bilirubin: 0.8 mg/dL (ref 0.2–1.2)
Total Protein: 6.5 g/dL (ref 6.0–8.3)

## 2018-07-09 LAB — BASIC METABOLIC PANEL
BUN: 17 mg/dL (ref 6–23)
CO2: 27 mEq/L (ref 19–32)
Calcium: 9.3 mg/dL (ref 8.4–10.5)
Chloride: 104 mEq/L (ref 96–112)
Creatinine, Ser: 0.94 mg/dL (ref 0.40–1.20)
GFR: 65.61 mL/min (ref >60.00)
Glucose, Bld: 81 mg/dL (ref 70–99)
Potassium: 4 mEq/L (ref 3.5–5.1)
Sodium: 139 mEq/L (ref 135–145)

## 2018-07-09 LAB — LIPID PANEL
Cholesterol: 194 mg/dL (ref 0–200)
HDL: 76.2 mg/dL (ref >39.00)
LDL Cholesterol: 105 mg/dL — ABNORMAL HIGH (ref 0–99)
NonHDL: 117.41
Total CHOL/HDL Ratio: 3
Triglycerides: 60 mg/dL (ref 0.0–149.0)
VLDL: 12 mg/dL (ref 0.0–40.0)

## 2018-07-09 LAB — CBC WITH DIFFERENTIAL/PLATELET
Basophils Absolute: 0 10*3/uL (ref 0.0–0.1)
Basophils Relative: 0.5 % (ref 0.0–3.0)
Eosinophils Absolute: 0.4 10*3/uL (ref 0.0–0.7)
Eosinophils Relative: 4.9 % (ref 0.0–5.0)
HCT: 39.6 % (ref 36.0–46.0)
Hemoglobin: 13.3 g/dL (ref 12.0–15.0)
LYMPHS PCT: 30.7 % (ref 12.0–46.0)
Lymphs Abs: 2.3 10*3/uL (ref 0.7–4.0)
MCHC: 33.6 g/dL (ref 30.0–36.0)
MCV: 85.5 fl (ref 78.0–100.0)
Monocytes Absolute: 0.6 10*3/uL (ref 0.1–1.0)
Monocytes Relative: 7.8 % (ref 3.0–12.0)
NEUTROS ABS: 4.1 10*3/uL (ref 1.4–7.7)
Neutrophils Relative %: 56.1 % (ref 43.0–77.0)
Platelets: 218 10*3/uL (ref 150.0–400.0)
RBC: 4.62 Mil/uL (ref 3.87–5.11)
RDW: 12.7 % (ref 11.5–15.5)
WBC: 7.3 10*3/uL (ref 4.0–10.5)

## 2018-07-09 LAB — TSH: TSH: 3.62 u[IU]/mL (ref 0.35–4.50)

## 2018-07-09 NOTE — Telephone Encounter (Signed)
Please call Dr. Dois Davenport Rivard and request a copy of mammogram and pap

## 2018-07-09 NOTE — Progress Notes (Signed)
Subjective:    Patient ID: [name] [name], female    DOB: June 16, 1975, 43 y.o.   MRN: [id number]  HPI  Patient presents today for complete physical.  Immunizations:  Flu shot and tetanus to date Diet: report that she is working on diet Exercise:  Jog/walk Pap Smear: 05/02/14- sees Dr. Estanislado Pandy Mammogram: last year Dental: up to date Vision: last year Wt Readings from Last 3 Encounters:  07/09/18 146 lb (66.2 kg)  04/03/18 147 lb (66.7 kg)  11/07/17 140 lb (63.5 kg)      Review of Systems  Constitutional: Negative for unexpected weight change.  HENT: Positive for rhinorrhea and sneezing.   Respiratory: Negative for cough and shortness of breath.   Cardiovascular: Negative for leg swelling.  Gastrointestinal: Negative for constipation and diarrhea.  Genitourinary: Negative for dysuria, frequency and menstrual problem.  Musculoskeletal: Negative for arthralgias and myalgias.  Skin:       Reports improvement in skin rash  Neurological: Negative for headaches.  Hematological: Negative for adenopathy.  Psychiatric/Behavioral:       Reports anxiety/depression is stable   Past Medical History:  Diagnosis Date  . Allergy   . Anal fissure   . Anxiety   . Depression   . External hemorrhoids      Social History   Socioeconomic History  . Marital status: Single    Spouse name: Not on file  . Number of children: 0  . Years of education: Not on file  . Highest education level: Not on file  Occupational History    Employer: [employer]  Social Needs  . Financial resource strain: Not on file  . Food insecurity:    Worry: Not on file    Inability: Not on file  . Transportation needs:    Medical: Not on file    Non-medical: Not on file  Tobacco Use  . Smoking status: Former Smoker    Last attempt to quit: 05/02/2001    Years since quitting: 17.1  . Smokeless tobacco: Never Used  Substance and Sexual Activity  . Alcohol use: Yes    Comment: 1-2 beers a day  . Drug  use: No    Types: Marijuana    Comment: 1-2 times daily  . Sexual activity: Not on file  Lifestyle  . Physical activity:    Days per week: Not on file    Minutes per session: Not on file  . Stress: Not on file  Relationships  . Social connections:    Talks on phone: Not on file    Gets together: Not on file    Attends religious service: Not on file    Active member of club or organization: Not on file    Attends meetings of clubs or organizations: Not on file    Relationship status: Not on file  . Intimate partner violence:    Fear of current or ex partner: Not on file    Emotionally abused: Not on file    Physically abused: Not on file    Forced sexual activity: Not on file  Other Topics Concern  . Not on file  Social History Narrative   Works at [employer] with boyfriend   No children     Past Surgical History:  Procedure Laterality Date  . COLONOSCOPY N/A 12/14/2012   Procedure: COLONOSCOPY;  Surgeon: Hart Carwin, MD;  Location: [facility];  Service: Endoscopy;  Laterality: N/A;  . WISDOM TOOTH EXTRACTION  Family History  Problem Relation Age of Onset  . Protein C deficiency Sister   . Deep vein thrombosis Sister   . Pulmonary embolism Sister   . Other Sister        pre-diabetes  . Heart disease Paternal Grandmother   . Colitis Mother   . Basal cell carcinoma Mother   . Other Brother        pre-diabetes  . Other Sister        pre-diabetes  . Colon cancer Neg Hx   . Cancer Neg Hx   . Drug abuse Neg Hx   . Stroke Neg Hx     No Known Allergies  Current Outpatient Medications on File Prior to Visit  Medication Sig Dispense Refill  . betamethasone valerate ointment (VALISONE) 0.1 % Apply 1 application topically 2 (two) times daily. 30 g 0  . Calcium Carbonate-Vitamin D (CALTRATE 600+D) 600-400 MG-UNIT tablet Take 1 tablet by mouth daily.    . cetirizine (ZYRTEC) 10 MG tablet Take 10 mg by mouth daily.      . folic acid (FOLVITE) 1 MG  tablet Take 1 mg by mouth daily.    Marland Kitchen gabapentin (NEURONTIN) 300 MG capsule Take 1 capsule (300 mg total) by mouth 3 (three) times daily. 90 capsule 1  . OMEGA-3 KRILL OIL PO Take 1 capsule by mouth daily.    Marland Kitchen pyridOXINE (VITAMIN B-6) 100 MG tablet Take 100 mg by mouth daily.    . sertraline (ZOLOFT) 100 MG tablet TAKE 1 TABLET BY MOUTH TWICE A DAY 180 tablet 1  . traZODone (DESYREL) 100 MG tablet TAKE 1 TABLET BY MOUTH EVERYDAY AT BEDTIME 90 tablet 1  . triamcinolone (KENALOG) 0.147 MG/GM topical spray Apply topically daily. To dry scalp     No current facility-administered medications on file prior to visit.     BP 104/67 (BP Location: Right Arm, Patient Position: Sitting, Cuff Size: Normal)   Pulse 71   Temp 98.4 F (36.9 C) (Oral)   Resp 16   Ht 5\' 4"  (1.626 m)   Wt 146 lb (66.2 kg)   LMP 06/12/2018 (Exact Date)   SpO2 100%   BMI 25.06 kg/m       Objective:   Physical Exam  Physical Exam  Constitutional: She is oriented to person, place, and time. She appears well-developed and well-nourished. No distress.  HENT:  Head: Normocephalic and atraumatic.  Right Ear: Tympanic membrane and ear canal normal.  Left Ear: Tympanic membrane and ear canal normal.  Mouth/Throat: Oropharynx is clear and moist.  Eyes: Pupils are equal, round, and reactive to light. No scleral icterus.  Neck: Normal range of motion. No thyromegaly present.  Cardiovascular: Normal rate and regular rhythm.   No murmur heard. Pulmonary/Chest: Effort normal and breath sounds normal. No respiratory distress. He has no wheezes. She has no rales. She exhibits no tenderness.  Abdominal: Soft. Bowel sounds are normal. She exhibits no distension and no mass. There is no tenderness. There is no rebound and no guarding.  Musculoskeletal: She exhibits no edema.  Lymphadenopathy:    She has no cervical adenopathy.  Neurological: She is alert and oriented to person, place, and time. She has normal patellar reflexes.  She exhibits normal muscle tone. Coordination normal.  Skin: Skin is warm and dry.  Psychiatric: She has a normal mood and affect. Her behavior is normal. Judgment and thought content normal.  Breasts: Examined lying Right: Without masses, retractions, discharge or axillary adenopathy.  Left:  Without masses, retractions, discharge or axillary adenopathy.           Assessment & Plan:   Preventative care- mammo/pap immunizations up to date. Will request copies of pap and mammo from gyn. Obtain routine lab work.       Assessment & Plan:

## 2018-07-09 NOTE — Patient Instructions (Addendum)
Continue healthy diet and regular exercise.  Complete lab work prior to leaving.    Preventive Care 40-64 Years, Female Preventive care refers to lifestyle choices and visits with your health care provider that can promote health and wellness. What does preventive care include?   A yearly physical exam. This is also called an annual well check.  Dental exams once or twice a year.  Routine eye exams. Ask your health care provider how often you should have your eyes checked.  Personal lifestyle choices, including: ? Daily care of your teeth and gums. ? Regular physical activity. ? Eating a healthy diet. ? Avoiding tobacco and drug use. ? Limiting alcohol use. ? Practicing safe sex. ? Taking low-dose aspirin daily starting at age 71. ? Taking vitamin and mineral supplements as recommended by your health care provider. What happens during an annual well check? The services and screenings done by your health care provider during your annual well check will depend on your age, overall health, lifestyle risk factors, and family history of disease. Counseling Your health care provider may ask you questions about your:  Alcohol use.  Tobacco use.  Drug use.  Emotional well-being.  Home and relationship well-being.  Sexual activity.  Eating habits.  Work and work Statistician.  Method of birth control.  Menstrual cycle.  Pregnancy history. Screening You may have the following tests or measurements:  Height, weight, and BMI.  Blood pressure.  Lipid and cholesterol levels. These may be checked every 5 years, or more frequently if you are over 42 years old.  Skin check.  Lung cancer screening. You may have this screening every year starting at age 46 if you have a 30-pack-year history of smoking and currently smoke or have quit within the past 15 years.  Colorectal cancer screening. All adults should have this screening starting at age 35 and continuing until age 7.  Your health care provider may recommend screening at age 62. You will have tests every 1-10 years, depending on your results and the type of screening test. People at increased risk should start screening at an earlier age. Screening tests may include: ? Guaiac-based fecal occult blood testing. ? Fecal immunochemical test (FIT). ? Stool DNA test. ? Virtual colonoscopy. ? Sigmoidoscopy. During this test, a flexible tube with a tiny camera (sigmoidoscope) is used to examine your rectum and lower colon. The sigmoidoscope is inserted through your anus into your rectum and lower colon. ? Colonoscopy. During this test, a long, thin, flexible tube with a tiny camera (colonoscope) is used to examine your entire colon and rectum.  Hepatitis C blood test.  Hepatitis B blood test.  Sexually transmitted disease (STD) testing.  Diabetes screening. This is done by checking your blood sugar (glucose) after you have not eaten for a while (fasting). You may have this done every 1-3 years.  Mammogram. This may be done every 1-2 years. Talk to your health care provider about when you should start having regular mammograms. This may depend on whether you have a family history of breast cancer.  BRCA-related cancer screening. This may be done if you have a family history of breast, ovarian, tubal, or peritoneal cancers.  Pelvic exam and Pap test. This may be done every 3 years starting at age 80. Starting at age 23, this may be done every 5 years if you have a Pap test in combination with an HPV test.  Bone density scan. This is done to screen for osteoporosis. You may have  this scan if you are at high risk for osteoporosis. Discuss your test results, treatment options, and if necessary, the need for more tests with your health care provider. Vaccines Your health care provider may recommend certain vaccines, such as:  Influenza vaccine. This is recommended every year.  Tetanus, diphtheria, and acellular  pertussis (Tdap, Td) vaccine. You may need a Td booster every 10 years.  Varicella vaccine. You may need this if you have not been vaccinated.  Zoster vaccine. You may need this after age 60.  Measles, mumps, and rubella (MMR) vaccine. You may need at least one dose of MMR if you were born in 1957 or later. You may also need a second dose.  Pneumococcal 13-valent conjugate (PCV13) vaccine. You may need this if you have certain conditions and were not previously vaccinated.  Pneumococcal polysaccharide (PPSV23) vaccine. You may need one or two doses if you smoke cigarettes or if you have certain conditions.  Meningococcal vaccine. You may need this if you have certain conditions.  Hepatitis A vaccine. You may need this if you have certain conditions or if you travel or work in places where you may be exposed to hepatitis A.  Hepatitis B vaccine. You may need this if you have certain conditions or if you travel or work in places where you may be exposed to hepatitis B.  Haemophilus influenzae type b (Hib) vaccine. You may need this if you have certain conditions. Talk to your health care provider about which screenings and vaccines you need and how often you need them. This information is not intended to replace advice given to you by your health care provider. Make sure you discuss any questions you have with your health care provider. Document Released: 05/15/2015 Document Revised: 06/08/2017 Document Reviewed: 02/17/2015 Elsevier Interactive Patient Education  2019 Elsevier Inc.  

## 2018-07-18 NOTE — Telephone Encounter (Signed)
Request faxed to dr. Greta Doom office, records faxed to [place] shortly after.

## 2018-10-24 ENCOUNTER — Other Ambulatory Visit: Payer: Self-pay | Admitting: Family

## 2018-10-24 ENCOUNTER — Telehealth: Payer: Self-pay | Admitting: Family

## 2018-10-24 ENCOUNTER — Other Ambulatory Visit: Payer: Self-pay

## 2018-10-24 MED ORDER — TRAZODONE HCL 100 MG PO TABS: ORAL_TABLET | ORAL | 1 refills | Status: DC

## 2018-10-24 NOTE — Addendum Note (Signed)
Addended by: Jiles Prows on: 10/24/2018 03:02 PM   Modules accepted: Orders

## 2018-10-24 NOTE — Telephone Encounter (Signed)
°  Pt req refill traZODone (DESYREL) 100 MG tablet  [facility]

## 2018-10-26 ENCOUNTER — Other Ambulatory Visit: Payer: Self-pay

## 2018-10-26 MED ORDER — SERTRALINE HCL 100 MG PO TABS: 100.0000 mg | ORAL_TABLET | Freq: Two times a day (BID) | ORAL | 1 refills | Status: DC

## 2018-12-20 ENCOUNTER — Telehealth: Payer: Self-pay | Admitting: Family

## 2018-12-20 NOTE — Telephone Encounter (Signed)
Trazodone 200mg  is too much medicine. If she is having issues with insomnia I recommend office visit to discuss other treatment options.

## 2018-12-20 NOTE — Telephone Encounter (Signed)
Pt want the dosage of this medication to be changed to 2 at bedtime traZODone (DESYREL) 100 MG tablet. Please call to advise

## 2018-12-20 NOTE — Telephone Encounter (Signed)
Please advise 

## 2018-12-21 NOTE — Telephone Encounter (Signed)
Patient advised of Melissa's comments. She will call back if she wants to schedule follow up

## 2019-02-19 ENCOUNTER — Other Ambulatory Visit: Payer: Self-pay | Admitting: Family

## 2019-02-19 NOTE — Telephone Encounter (Signed)
Melissa -- do we need to cancel [facility] [place]?  Last trazodone RX: 10/24/18, #90 x 1 [facility] [place]. Request is coming from [place]: 07/09/18 Next OV: was due 01/09/19 and is past due.

## 2019-02-20 MED ORDER — TRAZODONE HCL 100 MG PO TABS: ORAL_TABLET | ORAL | 0 refills | Status: DC

## 2019-02-20 NOTE — Telephone Encounter (Signed)
Please cancel refill at [facility]. Refill sent to [place].  Please contact pt to schedule follow up visit.

## 2019-02-22 NOTE — Telephone Encounter (Signed)
Patient scheduled for 02-26-2019 at 4pm. rx cancelled [facility] [place] [facility]

## 2019-02-25 ENCOUNTER — Other Ambulatory Visit: Payer: Self-pay

## 2019-02-26 ENCOUNTER — Ambulatory Visit: Payer: 59 | Admitting: Family

## 2019-02-26 ENCOUNTER — Other Ambulatory Visit: Payer: Self-pay

## 2019-02-26 ENCOUNTER — Ambulatory Visit (INDEPENDENT_AMBULATORY_CARE_PROVIDER_SITE_OTHER): Payer: 59 | Admitting: Family

## 2019-02-26 VITALS — BP 117/80 | HR 78 | Temp 97.9°F | Resp 16 | Ht 64.0 in | Wt 145.0 lb

## 2019-02-26 DIAGNOSIS — Z23 Encounter for immunization: Secondary | ICD-10-CM

## 2019-02-26 DIAGNOSIS — M2550 Pain in unspecified joint: Secondary | ICD-10-CM

## 2019-02-26 DIAGNOSIS — G4700 Insomnia, unspecified: Secondary | ICD-10-CM

## 2019-02-26 DIAGNOSIS — F419 Anxiety disorder, unspecified: Secondary | ICD-10-CM

## 2019-02-26 DIAGNOSIS — F329 Major depressive disorder, single episode, unspecified: Secondary | ICD-10-CM | POA: Diagnosis not present

## 2019-02-26 MED ORDER — SERTRALINE HCL 100 MG PO TABS: 100.0000 mg | ORAL_TABLET | Freq: Two times a day (BID) | ORAL | 1 refills | Status: DC

## 2019-02-26 NOTE — Progress Notes (Signed)
Subjective:    Patient ID: [name] [name], female    DOB: 1975-11-12, 43 y.o.   MRN: [id number]  HPI  Patient is a 43 yr old female who presents today for follow up.  Anxiety/Depression- Reports that she has felt a little bit more emotional lately related to stressors in the setting of the COVID-19 pandemic.  Marriage is on hold.  This disappoints her some and has caused a bit of tension between her and her fianc.  She is working from home.  Overall however she feels that she has been handling things well and that her medications are working well.  She does note that she has some issues falling asleep at night.  She continues to take trazodone 100 mg at bedtime.   Review of Systems See HPI  Past Medical History:  Diagnosis Date  . Allergy   . Anal fissure   . Anxiety   . Depression   . External hemorrhoids      Social History   Socioeconomic History  . Marital status: Single    Spouse name: Not on file  . Number of children: 0  . Years of education: Not on file  . Highest education level: Not on file  Occupational History    Employer: [employer]  . Financial resource strain: Not on file  . Food insecurity    Worry: Not on file    Inability: Not on file  . Transportation needs    Medical: Not on file    Non-medical: Not on file  Tobacco Use  . Smoking status: Former Smoker    Quit date: 05/02/2001    Years since quitting: 17.8  . Smokeless tobacco: Never Used  Substance and Sexual Activity  . Alcohol use: Yes    Comment: 1-2 beers a day  . Drug use: No    Types: Marijuana    Comment: 1-2 times daily  . Sexual activity: Not on file  Lifestyle  . Physical activity    Days per week: Not on file    Minutes per session: Not on file  . Stress: Not on file  Relationships  . Social Herbalist on phone: Not on file    Gets together: Not on file    Attends religious service: Not on file    Active member of club or organization: Not on  file    Attends meetings of clubs or organizations: Not on file    Relationship status: Not on file  . Intimate partner violence    Fear of current or ex partner: Not on file    Emotionally abused: Not on file    Physically abused: Not on file    Forced sexual activity: Not on file  Other Topics Concern  . Not on file  Social History Narrative   Works at [employer] with boyfriend   No children     Past Surgical History:  Procedure Laterality Date  . COLONOSCOPY N/A 12/14/2012   Procedure: COLONOSCOPY;  Surgeon: Lafayette Dragon, MD;  Location: [facility];  Service: Endoscopy;  Laterality: N/A;  . WISDOM TOOTH EXTRACTION      Family History  Problem Relation Age of Onset  . Protein C deficiency Sister   . Deep vein thrombosis Sister   . Pulmonary embolism Sister   . Other Sister        pre-diabetes  . Heart disease Paternal Grandmother   . Colitis Mother   .  Basal cell carcinoma Mother   . Other Brother        pre-diabetes  . Other Sister        pre-diabetes  . Colon cancer Neg Hx   . Cancer Neg Hx   . Drug abuse Neg Hx   . Stroke Neg Hx     No Known Allergies  Current Outpatient Medications on File Prior to Visit  Medication Sig Dispense Refill  . betamethasone valerate ointment (VALISONE) 0.1 % Apply 1 application topically 2 (two) times daily. 30 g 0  . Calcium Carbonate-Vitamin D (CALTRATE 600+D) 600-400 MG-UNIT tablet Take 1 tablet by mouth daily.    . cetirizine (ZYRTEC) 10 MG tablet Take 10 mg by mouth daily.      . folic acid (FOLVITE) 1 MG tablet Take 1 mg by mouth daily.    . OMEGA-3 KRILL OIL PO Take 1 capsule by mouth daily.    Marland Kitchen pyridOXINE (VITAMIN B-6) 100 MG tablet Take 100 mg by mouth daily.    . sertraline (ZOLOFT) 100 MG tablet Take 1 tablet (100 mg total) by mouth 2 (two) times daily. 180 tablet 1  . traZODone (DESYREL) 100 MG tablet TAKE 1 TABLET BY MOUTH EVERYDAY AT BEDTIME 90 tablet 0  . triamcinolone (KENALOG) 0.147 MG/GM topical  spray Apply topically daily. To dry scalp    . gabapentin (NEURONTIN) 300 MG capsule Take 1 capsule (300 mg total) by mouth 3 (three) times daily. (Patient not taking: Reported on 02/26/2019) 90 capsule 1   No current facility-administered medications on file prior to visit.     BP 117/80 (BP Location: Right Arm, Patient Position: Sitting, Cuff Size: Small)   Pulse 78   Temp 97.9 F (36.6 C) (Temporal)   Resp 16   Ht 5\' 4"  (1.626 m)   Wt 145 lb (65.8 kg)   SpO2 100%   BMI 24.89 kg/m       Objective:   Physical Exam Constitutional:      Appearance: She is well-developed.  Neck:     Musculoskeletal: Neck supple.     Thyroid: No thyromegaly.  Cardiovascular:     Rate and Rhythm: Normal rate and regular rhythm.     Heart sounds: Normal heart sounds. No murmur.  Pulmonary:     Effort: Pulmonary effort is normal. No respiratory distress.     Breath sounds: Normal breath sounds. No wheezing.  Skin:    General: Skin is warm and dry.  Neurological:     Mental Status: She is alert and oriented to person, place, and time.  Psychiatric:        Behavior: Behavior normal.        Thought Content: Thought content normal.        Judgment: Judgment normal.           Assessment & Plan:  Anxiety/depression-overall well controlled.  I think she is having some normal reaction to the stress of the pandemic.  We will continue current meds.  Insomnia-continue trazodone 100 mg at bedtime.  Advised okay to use melatonin on an as-needed basis.

## 2019-02-26 NOTE — Patient Instructions (Signed)
Please consider counseling through the EAP program at your work.

## 2019-03-11 ENCOUNTER — Emergency Department (INDEPENDENT_AMBULATORY_CARE_PROVIDER_SITE_OTHER)
Admission: EM | Admit: 2019-03-11 | Discharge: 2019-03-11 | Disposition: A | Discharge status: Home / Self Care | Payer: 59 | Source: Home / Self Care

## 2019-03-11 ENCOUNTER — Other Ambulatory Visit: Payer: Self-pay

## 2019-03-11 ENCOUNTER — Telehealth: Payer: 59

## 2019-03-11 ENCOUNTER — Encounter: Payer: Self-pay | Admitting: Emergency Medicine

## 2019-03-11 DIAGNOSIS — S0185XA Open bite of other part of head, initial encounter: Secondary | ICD-10-CM

## 2019-03-11 DIAGNOSIS — W540XXA Bitten by dog, initial encounter: Secondary | ICD-10-CM | POA: Diagnosis not present

## 2019-03-11 DIAGNOSIS — L089 Local infection of the skin and subcutaneous tissue, unspecified: Secondary | ICD-10-CM

## 2019-03-11 MED ORDER — AMOXICILLIN-POT CLAVULANATE 875-125 MG PO TABS: 1.0000 | ORAL_TABLET | Freq: Two times a day (BID) | ORAL | 0 refills | Status: DC

## 2019-03-11 NOTE — ED Triage Notes (Signed)
Dog bite on RT jaw yesterday morning, swollen, painful, hard. It is her dog, u-t-d on his shots.

## 2019-03-11 NOTE — Discharge Instructions (Signed)
°  Keep wound clean with warm water and soap. You may apply a warm damp washcloth 2-3 times daily for 10-20 minutes at a time to help with pain and encourage drainage of any pus.  Please take antibiotics as prescribed and be sure to complete entire course even if you start to feel better to ensure infection does not come back.  Please follow up in 3-4 days if not improving, sooner if worsening.

## 2019-03-11 NOTE — ED Notes (Signed)
July 2019.

## 2019-03-11 NOTE — ED Provider Notes (Signed)
[facility]    CSN: [id number] Arrival date & time: 03/11/19  1307      History   Chief Complaint Chief Complaint  Patient presents with  . Animal Bite    HPI [name] [name] is a 43 y.o. female.   HPI [name] [name] is a 43 y.o. female presenting to UC with c/o suddenly worsening Right lower jaw pain and swelling after she was bit by her dachshund yesterday.  She was picking up her dog when its nail got caught on something causing the dog to suddenly snap at patient's face. Dog is UTD on rabies vaccine. Pt's last tetanus was about 1 year ago.  Pain is aching and sore. She is concerned the area is becoming infected.    Past Medical History:  Diagnosis Date  . Allergy   . Anal fissure   . Anxiety   . Depression   . External hemorrhoids     Patient Active Problem List   Diagnosis Date Noted  . Thumb injury, right, initial encounter 11/08/2017  . Sciatica 07/10/2014  . Low back pain 05/10/2012  . UNSPECIFIED MYALGIA AND MYOSITIS 04/28/2008  . Anxiety and depression 09/08/2006  . Depressive disorder, not elsewhere classified 09/08/2006  . ALLERGIC RHINITIS 09/06/2006  . INSOMNIA 09/06/2006    Past Surgical History:  Procedure Laterality Date  . COLONOSCOPY N/A 12/14/2012   Procedure: COLONOSCOPY;  Surgeon: Hart Carwin, MD;  Location: [facility];  Service: Endoscopy;  Laterality: N/A;  . WISDOM TOOTH EXTRACTION      OB History   No obstetric history on file.      Home Medications    Prior to Admission medications   Medication Sig Start Date End Date Taking? Authorizing Provider  amoxicillin-clavulanate (AUGMENTIN) 875-125 MG tablet Take 1 tablet by mouth 2 (two) times daily. One po bid x 7 days 03/11/19   Lurene Shadow, PA-C  betamethasone valerate ointment (VALISONE) 0.1 % Apply 1 application topically 2 (two) times daily. 04/03/18   Sandford Craze, NP  cetirizine (ZYRTEC) 10 MG tablet Take 10 mg by mouth daily.      [provider]  sertraline (ZOLOFT) 100 MG tablet Take 1 tablet (100 mg total) by mouth 2 (two) times daily. 02/26/19   Sandford Craze, NP  traZODone (DESYREL) 100 MG tablet TAKE 1 TABLET BY MOUTH EVERYDAY AT BEDTIME 02/20/19   Sandford Craze, NP  triamcinolone (KENALOG) 0.147 MG/GM topical spray Apply topically daily. To dry scalp    [provider]    Family History Family History  Problem Relation Age of Onset  . Protein C deficiency Sister   . Deep vein thrombosis Sister   . Pulmonary embolism Sister   . Other Sister        pre-diabetes  . Heart disease Paternal Grandmother   . Colitis Mother   . Basal cell carcinoma Mother   . Other Brother        pre-diabetes  . Other Sister        pre-diabetes  . Colon cancer Neg Hx   . Cancer Neg Hx   . Drug abuse Neg Hx   . Stroke Neg Hx     Social History Social History   Tobacco Use  . Smoking status: Former Smoker    Quit date: 05/02/2001    Years since quitting: 17.8  . Smokeless tobacco: Never Used  Substance Use Topics  . Alcohol use: Yes    Comment: 1-2 beers a day  .  Drug use: No    Types: Marijuana    Comment: 1-2 times daily     Allergies   Patient has no known allergies.   Review of Systems Review of Systems  Constitutional: Negative for chills and fever.  HENT: Positive for facial swelling.   Gastrointestinal: Negative for nausea and vomiting.  Skin: Positive for wound. Negative for color change.     Physical Exam Triage Vital Signs ED Triage Vitals  Enc Vitals Group     BP 03/11/19 1323 106/69     Pulse Rate 03/11/19 1323 76     Resp --      Temp 03/11/19 1323 98.8 F (37.1 C)     Temp Source 03/11/19 1323 Oral     SpO2 03/11/19 1323 98 %     Weight 03/11/19 1324 140 lb (63.5 kg)     Height 03/11/19 1324 5\' 4"  (1.626 m)     Head Circumference --      Peak Flow --      Pain Score 03/11/19 1324 2     Pain Loc --      Pain Edu? --      Excl. in GC? --    No data found.   Updated Vital Signs BP 106/69 (BP Location: Right Arm)   Pulse 76   Temp 98.8 F (37.1 C) (Oral)   Ht 5\' 4"  (1.626 m)   Wt 140 lb (63.5 kg)   LMP 03/03/2019 (Approximate)   SpO2 98%   BMI 24.03 kg/m   Visual Acuity Right Eye Distance:   Left Eye Distance:   Bilateral Distance:    Right Eye Near:   Left Eye Near:    Bilateral Near:     Physical Exam Vitals signs and nursing note reviewed.  Constitutional:      Appearance: Normal appearance. She is well-developed.  HENT:     Head: Normocephalic.     Jaw: There is normal jaw occlusion. Tenderness and swelling present. No trismus, pain on movement or malocclusion.   Neck:     Musculoskeletal: Normal range of motion.  Cardiovascular:     Rate and Rhythm: Normal rate.  Pulmonary:     Effort: Pulmonary effort is normal.  Musculoskeletal: Normal range of motion.  Skin:    General: Skin is warm and dry.  Neurological:     Mental Status: She is alert and oriented to person, place, and time.  Psychiatric:        Behavior: Behavior normal.      UC Treatments / Results  Labs (all labs ordered are listed, but only abnormal results are displayed) Labs Reviewed - No data to display  EKG   Radiology No results found.  Procedures Procedures (including critical care time)  Medications Ordered in UC Medications - No data to display  Initial Impression / Assessment and Plan / UC Course  I have reviewed the triage vital signs and the nursing notes.  Pertinent labs & imaging results that were available during my care of the patient were reviewed by me and considered in my medical decision making (see chart for details).     Hx and exam c/w infected dog bite Will start pt on Augmentin AVS provided  Final Clinical Impressions(s) / UC Diagnoses   Final diagnoses:  Infected dog bite of face, initial encounter     Discharge Instructions      Keep wound clean with warm water and soap. You may apply a warm damp  washcloth  2-3 times daily for 10-20 minutes at a time to help with pain and encourage drainage of any pus.  Please take antibiotics as prescribed and be sure to complete entire course even if you start to feel better to ensure infection does not come back.  Please follow up in 3-4 days if not improving, sooner if worsening.    ED Prescriptions    Medication Sig Dispense Auth. Provider   amoxicillin-clavulanate (AUGMENTIN) 875-125 MG tablet Take 1 tablet by mouth 2 (two) times daily. One po bid x 7 days 14 tablet Noe Gens, [place]     PDMP not reviewed this encounter.   Noe Gens, PA-C 03/12/19 1144

## 2019-05-17 ENCOUNTER — Other Ambulatory Visit: Payer: Self-pay | Admitting: Family

## 2019-05-22 ENCOUNTER — Other Ambulatory Visit: Payer: Self-pay | Admitting: Family

## 2019-07-09 ENCOUNTER — Other Ambulatory Visit: Payer: Self-pay

## 2019-07-09 ENCOUNTER — Ambulatory Visit: Payer: 59 | Admitting: Family

## 2019-07-09 ENCOUNTER — Ambulatory Visit (INDEPENDENT_AMBULATORY_CARE_PROVIDER_SITE_OTHER): Payer: 59 | Admitting: Family

## 2019-07-09 DIAGNOSIS — R197 Diarrhea, unspecified: Secondary | ICD-10-CM

## 2019-07-09 NOTE — Progress Notes (Signed)
Virtual Visit via Video Note  I connected with [name] [name] on 07/09/19 at  5:20 PM EST by a video enabled telemedicine application and verified that I am speaking with the correct person using two identifiers.  Location: Patient: home Provider: office   I discussed the limitations of evaluation and management by telemedicine and the availability of in person appointments. The patient expressed understanding and agreed to proceed.  History of Present Illness:  Patient is a 44 yr old female who presents today to discuss diarrhea.  Reports that symptoms began 1 month ago. Reports "my stomach is always messed up, either constipated or diarrhea."  Reports that she constipated 1 month ago. Was drinking a lot of water, miralax, pineapple tea. Then turned to diarrhea. No recent travel.  She is using align this past week.   She has hx of collagenous colitis back in 8/14 diagnosed on biopsy. Father had crohn's disease and mom has colitis. Abdominal discomfort prior to BM's.  Reports about 10+ loose stools a day. She is trying to stay hydrated.  Denies black/bloody stools.      Observations/Objective:   Gen: Awake, alert, no acute distress Resp: Breathing is even and non-labored Psych: calm/pleasant demeanor Neuro: Alert and Oriented x 3, + facial symmetry, speech is clear.   Assessment and Plan:  Diarrhea-uncontrolled.  Concerning for recurrent microscopic colitis given the duration of symptoms and her past history.  I have advised the patient that I would like to get her back in for GI evaluation.  I would also like for her to complete stool studies to rule out pathogen.  In the meantime I have advised her to be sure to stay well-hydrated due to her recurrent diarrhea.  When she comes to the lab to pick up her stool kit I will also check a CBC and a basic metabolic panel to further evaluate.  Follow Up Instructions:    I discussed the assessment and treatment plan with the patient. The  patient was provided an opportunity to ask questions and all were answered. The patient agreed with the plan and demonstrated an understanding of the instructions.   The patient was advised to call back or seek an in-person evaluation if the symptoms worsen or if the condition fails to improve as anticipated.  Nance Pear, NP

## 2019-07-10 ENCOUNTER — Encounter: Payer: Self-pay | Admitting: Nurse Practitioner

## 2019-07-10 ENCOUNTER — Other Ambulatory Visit (INDEPENDENT_AMBULATORY_CARE_PROVIDER_SITE_OTHER): Payer: 59

## 2019-07-10 ENCOUNTER — Encounter: Payer: Self-pay | Admitting: Family

## 2019-07-10 ENCOUNTER — Ambulatory Visit: Payer: 59 | Admitting: Family

## 2019-07-10 ENCOUNTER — Other Ambulatory Visit: Payer: Self-pay

## 2019-07-10 DIAGNOSIS — R197 Diarrhea, unspecified: Secondary | ICD-10-CM

## 2019-07-10 LAB — CBC WITH DIFFERENTIAL/PLATELET
Basophils Absolute: 0 10*3/uL (ref 0.0–0.1)
Basophils Relative: 0.2 % (ref 0.0–3.0)
Eosinophils Absolute: 0.2 10*3/uL (ref 0.0–0.7)
Eosinophils Relative: 2.4 % (ref 0.0–5.0)
HCT: 38.1 % (ref 36.0–46.0)
Hemoglobin: 12.8 g/dL (ref 12.0–15.0)
Lymphocytes Relative: 26.9 % (ref 12.0–46.0)
Lymphs Abs: 2.3 10*3/uL (ref 0.7–4.0)
MCHC: 33.7 g/dL (ref 30.0–36.0)
MCV: 85.5 fl (ref 78.0–100.0)
Monocytes Absolute: 0.6 10*3/uL (ref 0.1–1.0)
Monocytes Relative: 7.6 % (ref 3.0–12.0)
Neutro Abs: 5.3 10*3/uL (ref 1.4–7.7)
Neutrophils Relative %: 62.9 % (ref 43.0–77.0)
Platelets: 225 10*3/uL (ref 150.0–400.0)
RBC: 4.45 Mil/uL (ref 3.87–5.11)
RDW: 13 % (ref 11.5–15.5)
WBC: 8.5 10*3/uL (ref 4.0–10.5)

## 2019-07-10 LAB — COMPREHENSIVE METABOLIC PANEL
ALT: 15 U/L (ref 0–35)
AST: 17 U/L (ref 0–37)
Albumin: 4.3 g/dL (ref 3.5–5.2)
Alkaline Phosphatase: 40 U/L (ref 39–117)
BUN: 13 mg/dL (ref 6–23)
CO2: 25 mEq/L (ref 19–32)
Calcium: 8.7 mg/dL (ref 8.4–10.5)
Chloride: 107 mEq/L (ref 96–112)
Creatinine, Ser: 0.75 mg/dL (ref 0.40–1.20)
GFR: 84.72 mL/min (ref >60.00)
Glucose, Bld: 81 mg/dL (ref 70–99)
Potassium: 3.8 mEq/L (ref 3.5–5.1)
Sodium: 137 mEq/L (ref 135–145)
Total Bilirubin: 0.5 mg/dL (ref 0.2–1.2)
Total Protein: 6.6 g/dL (ref 6.0–8.3)

## 2019-07-10 NOTE — Addendum Note (Signed)
Addended by: Sandford Craze on: 07/10/2019 02:13 PM   Modules accepted: Orders

## 2019-07-16 ENCOUNTER — Other Ambulatory Visit: Payer: Self-pay

## 2019-07-16 ENCOUNTER — Other Ambulatory Visit: Payer: 59

## 2019-07-16 DIAGNOSIS — R197 Diarrhea, unspecified: Secondary | ICD-10-CM

## 2019-07-17 ENCOUNTER — Ambulatory Visit (INDEPENDENT_AMBULATORY_CARE_PROVIDER_SITE_OTHER): Payer: 59 | Admitting: Nurse Practitioner

## 2019-07-17 ENCOUNTER — Encounter: Payer: Self-pay | Admitting: Nurse Practitioner

## 2019-07-17 VITALS — BP 100/60 | HR 76 | Temp 98.3°F | Ht 64.0 in | Wt 145.2 lb

## 2019-07-17 DIAGNOSIS — R197 Diarrhea, unspecified: Secondary | ICD-10-CM | POA: Diagnosis not present

## 2019-07-17 DIAGNOSIS — R140 Abdominal distension (gaseous): Secondary | ICD-10-CM | POA: Diagnosis not present

## 2019-07-17 NOTE — Progress Notes (Signed)
ASSESSMENT / PLAN:   [name] [name] is a 44 y.o. female with a pmh significant for, but not necessarily limited to, collagenous colitis 2014, depression  # Diarrhea / remote hx of collagenous colitis --Started 5 weeks ago (BMs normal prior to onset). Having multiple episodes of nonbloody stools daily, mainly postprandial but some nocturnal stooling as well.  --Diarrhea started after transient episode of constipation. Doubt overflow diarrhea in this case (no incontinence, stool volume is moderate).  --await stools studies, if negative consider treating empirically for collagenous colitis. If no improvement then proceed with colonoscopy --Celiac studies negative in past    HPI:    Referring Provider: [name], NP  Reason for Referral : Diarrhea  Chief Complaint:  Diarrhea   44 year old female previously followed by Dr. Olevia Perches.  She has a [facility] of IBD in mother. Patient worked up in 2014 for diarrhea. Celiac studies were normal.  She had a colonoscopy with biopsies and TI intubation in 2014.  Random biopsies compatible with collagenous colitis.  She was treated with Entocort and did well without diarrhea until several weeks ago  [name] [name] had a telemedicine visit with her PCP on 07/09/19 to discuss diarrhea..She became constipated several weeks ago.  She took pineapple tea, metamucil, and increased water intakes and constipation resolved within a couple of days but she subsequently developed diarrhea with pre-defecatory cramping . She is having multiple loose, non-bloody BMs a day, mainly postprandial but some nocturnal stooling as well.  No associated urgency and no incontinence.  She does endorse some mild nausea without vomiting.  She has felt bloated. Labs yesterday were normal.  GI pathogen panel is pending.. No fevers. No weight loss. No unusual joint aches.  Last antibiotic use was back in November.  She has not started any new medications to which area can be  attributed.    Data Reviewed:   07/10/2019 CMP normal CBC normal   Past Medical History:  Diagnosis Date  . Allergy   . Anal fissure   . Anxiety   . Arthritis   . Depression   . External hemorrhoids      Past Surgical History:  Procedure Laterality Date  . COLONOSCOPY N/A 12/14/2012   Procedure: COLONOSCOPY;  Surgeon: Lafayette Dragon, MD;  Location: [facility];  Service: Endoscopy;  Laterality: N/A;  . ORIF FINGER / THUMB FRACTURE Right   . WISDOM TOOTH EXTRACTION     Family History  Problem Relation Age of Onset  . Protein C deficiency Sister   . Deep vein thrombosis Sister   . Pulmonary embolism Sister   . Other Sister        pre-diabetes  . Heart disease Paternal Grandmother   . Colitis Mother   . Basal cell carcinoma Mother   . Other Brother        pre-diabetes  . Other Sister        pre-diabetes  . Crohn's disease Father        questionable diagnosis  . Colon cancer Neg Hx   . Cancer Neg Hx   . Drug abuse Neg Hx   . Stroke Neg Hx    Social History   Tobacco Use  . Smoking status: Former Smoker    Quit date: 05/02/2001    Years since quitting: 18.2  . Smokeless tobacco: Never Used  Substance Use Topics  . Alcohol use: Yes    Comment: 1-2 beers a  day  . Drug use: No    Types: Marijuana    Comment: 1-2 times daily   Current Outpatient Medications  Medication Sig Dispense Refill  . cetirizine (ZYRTEC) 10 MG tablet Take 10 mg by mouth daily.      Marland Kitchen omeprazole (PRILOSEC) 20 MG capsule Take 20 mg by mouth daily as needed.    . Probiotic Product (ALIGN) 4 MG CAPS Take 1 capsule by mouth daily.    . sertraline (ZOLOFT) 100 MG tablet TAKE 1 TABLET BY MOUTH TWICE A DAY (Patient taking differently: Take 200 mg by mouth daily. ) 180 tablet 0  . traZODone (DESYREL) 100 MG tablet TAKE 1 TABLET BY MOUTH EVERYDAY AT BEDTIME 90 tablet 0   No current facility-administered medications for this visit.   No Known Allergies   Review of Systems: Positive for  arthritis, back pain, depression, muscle pain and cramping and night sweats.   All other systems reviewed and negative except where noted in HPI.   Serum creatinine: 0.75 mg/dL 47/42/59 5638 Estimated creatinine clearance: 85.6 mL/min   Physical Exam:    Wt Readings from Last 3 Encounters:  07/17/19 145 lb 3.2 oz (65.9 kg)  03/11/19 140 lb (63.5 kg)  02/26/19 145 lb (65.8 kg)    BP 100/60   Pulse 76   Temp 98.3 F (36.8 C)   Ht 5\' 4"  (1.626 m)   Wt 145 lb 3.2 oz (65.9 kg)   BMI 24.92 kg/m  Constitutional:  Pleasant female in no acute distress. Psychiatric: Normal mood and affect. Behavior is normal. EENT: Pupils normal.  Conjunctivae are normal. No scleral icterus. Neck supple.  Cardiovascular: Normal rate, regular rhythm. No edema Pulmonary/chest: Effort normal and breath sounds normal. No wheezing, rales or rhonchi. Abdominal: Soft, nondistended, nontender. Bowel sounds active throughout. There are no masses palpable. No hepatomegaly. Neurological: Alert and oriented to person place and time. Skin: Skin is warm and dry. No rashes noted.  , NP  07/17/2019, 10:19 AM  Cc:  Referring Provider 07/19/2019, NP

## 2019-07-17 NOTE — Patient Instructions (Signed)
If you are age 42 or older, your body mass index should be between 23-30. Your Body mass index is 24.92 kg/m. If this is out of the aforementioned range listed, please consider follow up with your Primary Care Provider.  If you are age 4 or younger, your body mass index should be between 19-25. Your Body mass index is 24.92 kg/m. If this is out of the aformentioned range listed, please consider follow up with your Primary Care Provider.    Hold all antidiarrhetics for now.  We will await the results of your stool studies.

## 2019-07-18 LAB — GI PROFILE, STOOL, PCR

## 2019-07-19 ENCOUNTER — Encounter: Payer: Self-pay | Admitting: Family

## 2019-07-19 ENCOUNTER — Telehealth: Payer: Self-pay | Admitting: Nurse Practitioner

## 2019-07-19 NOTE — Telephone Encounter (Signed)
GI pathogen panel is negative.

## 2019-07-22 ENCOUNTER — Other Ambulatory Visit: Payer: Self-pay

## 2019-07-22 MED ORDER — BUDESONIDE 3 MG PO CPEP: 9.0000 mg | ORAL_CAPSULE | Freq: Every day | ORAL | 1 refills | Status: DC

## 2019-07-22 NOTE — Telephone Encounter (Signed)
Patient is advised. Rx for Entocort set to [facility] in [place] [address].  Follow up 08/19/19. Call with progress or if having issues.

## 2019-07-22 NOTE — Telephone Encounter (Signed)
Beth, please tell [name] [name] we can treat again for collagenous colitis and see if diarrhea resolves. Can yo call in Entocort 3 mg take 3 q am and get her a follow up. Ask her to call with condition update in 2-3 weeks. Thanks

## 2019-07-26 NOTE — Progress Notes (Signed)
Reviewed and agree with documentation and assessment and plan. K. Veena Tymeir Weathington , MD   

## 2019-08-13 ENCOUNTER — Other Ambulatory Visit: Payer: Self-pay | Admitting: Nurse Practitioner

## 2019-08-14 ENCOUNTER — Other Ambulatory Visit: Payer: Self-pay | Admitting: Family

## 2019-08-19 ENCOUNTER — Encounter: Payer: Self-pay | Admitting: Nurse Practitioner

## 2019-08-19 ENCOUNTER — Ambulatory Visit (INDEPENDENT_AMBULATORY_CARE_PROVIDER_SITE_OTHER): Payer: 59 | Admitting: Nurse Practitioner

## 2019-08-19 VITALS — BP 130/80 | HR 67 | Temp 98.2°F | Wt 146.1 lb

## 2019-08-19 DIAGNOSIS — K52839 Microscopic colitis, unspecified: Secondary | ICD-10-CM | POA: Diagnosis not present

## 2019-08-19 MED ORDER — BUDESONIDE 3 MG PO CPEP: ORAL_CAPSULE | ORAL | 0 refills | Status: AC

## 2019-08-19 NOTE — Patient Instructions (Signed)
If you are age 42 or older, your body mass index should be between 23-30. Your Body mass index is 25.08 kg/m. If this is out of the aforementioned range listed, please consider follow up with your Primary Care Provider.  If you are age 79 or younger, your body mass index should be between 19-25. Your Body mass index is 25.08 kg/m. If this is out of the aformentioned range listed, please consider follow up with your Primary Care Provider.    Decrease your Entocort to 6 mg for 2 weeks, Then 3 mg for 2 weeks, then stop.  Call us if you have recurrent diarrhea.  Thank you for entrusting me with your care and for choosing [facility], Willette Cluster, NP

## 2019-08-19 NOTE — Progress Notes (Signed)
           IMPRESSION and PLAN:     #Collagenous colitis --Back in remission on Entocort 9 mg daily x 1 month --Decrease Entocort to 6 mg daily x2 weeks followed by 3 mg daily x2 weeks and then discontinue --Patient will call if she has any recurrent diarrhea otherwise follow-up as needed  #Colon cancer screening --To start at age 44  HPI:    Primary GI: Dr. Lavon Paganini  Chief complaint : Follow-up on diarrhea  [name] [name] was seen in mid March for evaluation of diarrhea.  She has history of collagenous colitis last flare was several years ago.  Her stool studies returned negative so she was started on budesonide 9 mg daily with almost instant relief of diarrhea.  She remains in clinical remission having 1 solid bowel movement a day.   Review of systems:     No chest pain, no SOB, no fevers, no urinary sx   Past Medical History:  Diagnosis Date  . Allergy   . Anal fissure   . Anxiety   . Arthritis   . Depression   . External hemorrhoids     Patient's surgical history, family medical history, social history, medications and allergies were all reviewed in Epic   Creatinine clearance cannot be calculated (Patient's most recent lab result is older than the maximum 21 days allowed.)  Current Outpatient Medications  Medication Sig Dispense Refill  . budesonide (ENTOCORT EC) 3 MG 24 hr capsule TAKE 3 CAPSULES (9 MG TOTAL) BY MOUTH DAILY. 90 capsule 1  . cetirizine (ZYRTEC) 10 MG tablet Take 10 mg by mouth daily.      . Fish Oil-Krill Oil (KRILL & FISH OIL BLEND) CAPS Take 1 capsule by mouth daily. 500mg     . Multiple Vitamins-Minerals (MULTIPLE VITAMINS/WOMENS) tablet Take by mouth daily.    omeprazole (PRILOSEC) 20 MG capsule Take 20 mg by mouth daily as needed.    . Probiotic Product (ALIGN) 4 MG CAPS Take 1 capsule by mouth daily.    . sertraline (ZOLOFT) 100 MG tablet TAKE 1 TABLET BY MOUTH TWICE A DAY (Patient taking differently: Take 200 mg by mouth daily. ) 180 tablet 0   . traZODone (DESYREL) 100 MG tablet TAKE 1 TABLET BY MOUTH EVERYDAY AT BEDTIME 90 tablet 0   No current facility-administered medications for this visit.    Physical Exam:     BP 130/80 (BP Location: Left Arm, Patient Position: Sitting, Cuff Size: Normal)   Pulse 67   Temp 98.2 F (36.8 C)   Wt 146 lb 2 oz (66.3 kg)   BMI 25.08 kg/m   GENERAL:  Pleasant female in NAD PSYCH: : Cooperative, normal affect CARDIAC:  RRR,  no peripheral edema PULM: Normal respiratory effort, lungs CTA bilaterally, no wheezing ABDOMEN:  Nondistended, soft, nontender. No obvious masses, no hepatomegaly,  normal bowel sounds SKIN:  turgor, no lesions seen Musculoskeletal:  Normal muscle tone, normal strength NEURO: Alert and oriented x 3, no focal neurologic deficits   Marland Kitchen , NP 08/19/2019, 9:40 AM

## 2019-08-20 NOTE — Progress Notes (Signed)
Reviewed and agree with documentation and assessment and plan. K. Veena Rache Klimaszewski , MD   

## 2019-08-27 ENCOUNTER — Encounter: Payer: 59 | Admitting: Family

## 2019-09-08 ENCOUNTER — Other Ambulatory Visit: Payer: Self-pay | Admitting: Family

## 2019-09-10 ENCOUNTER — Other Ambulatory Visit: Payer: Self-pay | Admitting: Nurse Practitioner

## 2019-11-12 ENCOUNTER — Other Ambulatory Visit: Payer: Self-pay | Admitting: Family

## 2019-11-13 ENCOUNTER — Telehealth: Payer: Self-pay | Admitting: Family

## 2019-11-15 ENCOUNTER — Other Ambulatory Visit: Payer: Self-pay | Admitting: Family

## 2019-11-15 NOTE — Telephone Encounter (Signed)
Melissa -- was not sure if this was reviewed previously. Please see drug warning before refilling Sertraline.  Drug-Drug: sertraline and traZODone Additive serotonergic effects may occur during coadministration of selective serotonin reuptake inhibitors (SSRIs) and Serotonergic Non-Opioid CNS Depressants, and the risk of developing serotonin syndrome may be increased.

## 2019-11-20 ENCOUNTER — Encounter: Payer: Self-pay | Admitting: Family

## 2019-11-20 ENCOUNTER — Ambulatory Visit (INDEPENDENT_AMBULATORY_CARE_PROVIDER_SITE_OTHER): Payer: 59 | Admitting: Family

## 2019-11-20 ENCOUNTER — Other Ambulatory Visit: Payer: Self-pay

## 2019-11-20 VITALS — BP 108/60 | HR 72 | Resp 17 | Ht 64.0 in | Wt 142.0 lb

## 2019-11-20 DIAGNOSIS — Z0000 Encounter for general adult medical examination without abnormal findings: Secondary | ICD-10-CM

## 2019-11-20 DIAGNOSIS — E785 Hyperlipidemia, unspecified: Secondary | ICD-10-CM | POA: Diagnosis not present

## 2019-11-20 DIAGNOSIS — E1169 Type 2 diabetes mellitus with other specified complication: Secondary | ICD-10-CM

## 2019-11-20 LAB — TSH: TSH: 4.23 u[IU]/mL (ref 0.35–4.50)

## 2019-11-20 LAB — LIPID PANEL
Cholesterol: 204 mg/dL — ABNORMAL HIGH (ref 0–200)
HDL: 84.9 mg/dL (ref >39.00)
LDL Cholesterol: 103 mg/dL — ABNORMAL HIGH (ref 0–99)
NonHDL: 119.49
Total CHOL/HDL Ratio: 2
Triglycerides: 81 mg/dL (ref 0.0–149.0)
VLDL: 16.2 mg/dL (ref 0.0–40.0)

## 2019-11-20 MED ORDER — CLOTRIMAZOLE-BETAMETHASONE 1-0.05 % EX CREA: 1.0000 "application " | TOPICAL_CREAM | Freq: Two times a day (BID) | CUTANEOUS | 0 refills | Status: DC

## 2019-11-20 NOTE — Progress Notes (Signed)
Subjective:    Patient ID: [name] [name], female    DOB: 04-Dec-1975, 44 y.o.   MRN: [id number]  HPI  Patient is a 44 yr old female who presents today for a complete physical.  Immunizations: Td 2019, had covid#1 Diet:reports diet is healthy Wt Readings from Last 3 Encounters:  11/20/19 142 lb (64.4 kg)  08/19/19 146 lb 2 oz (66.3 kg)  07/17/19 145 lb 3.2 oz (65.9 kg)  Exercise: not enough Pap Smear: 09/22/15 Mammogram:  Up to date Vision: plans to make an appointment Dental: up to date  Review of Systems  Constitutional: Negative for unexpected weight change.  HENT: Negative for hearing loss and rhinorrhea.   Eyes: Negative for visual disturbance.  Respiratory: Negative for cough and shortness of breath.   Cardiovascular: Negative for chest pain.  Gastrointestinal: Negative for constipation and diarrhea.  Genitourinary: Negative for dysuria, frequency and hematuria.  Musculoskeletal: Negative for arthralgias and myalgias.  Skin: Negative for rash.  Neurological: Negative for headaches.  Hematological: Negative for adenopathy.  Psychiatric/Behavioral:       Denies depression/anxiety   Past Medical History:  Diagnosis Date  . Allergy   . Anal fissure   . Anxiety   . Arthritis   . Depression   . External hemorrhoids      Social History   Socioeconomic History  . Marital status: Single    Spouse name: Not on file  . Number of children: 0  . Years of education: Not on file  . Highest education level: Not on file  Occupational History    Employer: [employer]  Tobacco Use  . Smoking status: Former Smoker    Quit date: 05/02/2001    Years since quitting: 18.5  . Smokeless tobacco: Never Used  Vaping Use  . Vaping Use: Never used  Substance and Sexual Activity  . Alcohol use: Yes    Comment: 1-2 beers a day  . Drug use: No    Types: Marijuana    Comment: 1-2 times daily  . Sexual activity: Not on file  Other Topics Concern  . Not on file  Social  History Narrative   Works at [employer] with boyfriend   No children    Social Determinants of Health   Financial Resource Strain:   . Difficulty of Paying Living Expenses:   Food Insecurity:   . Worried About Programme researcher, broadcasting/film/video in the Last Year:   . Barista in the Last Year:   Transportation Needs:   . Freight forwarder (Medical):   Marland Kitchen Lack of Transportation (Non-Medical):   Physical Activity:   . Days of Exercise per Week:   . Minutes of Exercise per Session:   Stress:   . Feeling of Stress :   Social Connections:   . Frequency of Communication with Friends and Family:   . Frequency of Social Gatherings with Friends and Family:   . Attends Religious Services:   . Active Member of Clubs or Organizations:   . Attends Banker Meetings:   Marland Kitchen Marital Status:   Intimate Partner Violence:   . Fear of Current or Ex-Partner:   . Emotionally Abused:   Marland Kitchen Physically Abused:   . Sexually Abused:     Past Surgical History:  Procedure Laterality Date  . COLONOSCOPY N/A 12/14/2012   Procedure: COLONOSCOPY;  Surgeon: Hart Carwin, MD;  Location: [facility];  Service: Endoscopy;  Laterality: N/A;  . ORIF  FINGER / THUMB FRACTURE Right   . WISDOM TOOTH EXTRACTION      Family History  Problem Relation Age of Onset  . Protein C deficiency Sister   . Deep vein thrombosis Sister   . Pulmonary embolism Sister   . Other Sister        pre-diabetes  . Heart disease Paternal Grandmother   . Colitis Mother   . Basal cell carcinoma Mother   . Other Brother        pre-diabetes  . Other Sister        pre-diabetes  . Crohn's disease Father        questionable diagnosis  . Colon cancer Neg Hx   . Cancer Neg Hx   . Drug abuse Neg Hx   . Stroke Neg Hx     No Known Allergies  Current Outpatient Medications on File Prior to Visit  Medication Sig Dispense Refill  . cetirizine (ZYRTEC) 10 MG tablet Take 10 mg by mouth daily.      . Fish Oil-Krill Oil  (KRILL & FISH OIL BLEND) CAPS Take 1 capsule by mouth daily. 500mg     . Multiple Vitamins-Minerals (MULTIPLE VITAMINS/WOMENS) tablet Take by mouth daily.    omeprazole (PRILOSEC) 20 MG capsule Take 20 mg by mouth daily as needed.    . Probiotic Product (ALIGN) 4 MG CAPS Take 1 capsule by mouth daily.    . sertraline (ZOLOFT) 100 MG tablet TAKE 1 TABLET BY MOUTH TWICE A DAY 180 tablet 1  . traZODone (DESYREL) 100 MG tablet TAKE 1 TABLET BY MOUTH EVERYDAY AT BEDTIME 90 tablet 0   No current facility-administered medications on file prior to visit.    BP 108/60 (BP Location: Right Arm, Patient Position: Sitting, Cuff Size: Normal)   Pulse 72   Resp 17   Ht 5\' 4"  (1.626 m)   Wt 142 lb (64.4 kg)   SpO2 98%   BMI 24.37 kg/m       Objective:   Physical Exam  Physical Exam  Constitutional: She is oriented to person, place, and time. She appears well-developed and well-nourished. No distress.  HENT:  Head: Normocephalic and atraumatic.  Right Ear: Tympanic membrane and ear canal normal.  Left Ear: Tympanic membrane and ear canal normal.  Mouth/Throat: Oropharynx is clear and moist.  Eyes: Pupils are equal, round, and reactive to light. No scleral icterus.  Neck: Normal range of motion. No thyromegaly present.  Cardiovascular: Normal rate and regular rhythm.   No murmur heard. Pulmonary/Chest: Effort normal and breath sounds normal. No respiratory distress. He has no wheezes. She has no rales. She exhibits no tenderness.  Abdominal: Soft. Bowel sounds are normal. She exhibits no distension and no mass. There is no tenderness. There is no rebound and no guarding.  Musculoskeletal: She exhibits no edema.  Lymphadenopathy:    She has no cervical adenopathy.  Neurological: She is alert and oriented to person, place, and time. She has normal patellar reflexes. She exhibits normal muscle tone. Coordination normal.  Skin: Skin is warm and dry. (small 1 inch wide annular dry lesion on left  upper thigh) Psychiatric: She has a normal mood and affect. Her behavior is normal. Judgment and thought content normal.  Breast/pelvic: deferred          Assessment & Plan:  Skin rash- ? Eczema vs fungal- will rx with lotrisone cream.   Preventative care- discussed healthy diet, and importance of regular exercise. Pap and mammogram are up  to date and she plans to schedule these with her GYN.   This visit occurred during the SARS-CoV-2 public health emergency.  Safety protocols were in place, including screening questions prior to the visit, additional usage of staff PPE, and extensive cleaning of exam room while observing appropriate contact time as indicated for disinfecting solutions.           Assessment & Plan:

## 2019-11-20 NOTE — Patient Instructions (Addendum)
Please schedule your mammogram at GYN.   Try to add 30 minutes of exercise 5 days a week.

## 2020-01-12 ENCOUNTER — Other Ambulatory Visit: Payer: Self-pay | Admitting: Family

## 2020-01-26 ENCOUNTER — Other Ambulatory Visit: Payer: Self-pay | Admitting: Family

## 2020-02-13 ENCOUNTER — Other Ambulatory Visit: Payer: Self-pay | Admitting: Nurse Practitioner

## 2020-07-12 ENCOUNTER — Other Ambulatory Visit: Payer: Self-pay | Admitting: Family

## 2020-11-11 ENCOUNTER — Other Ambulatory Visit: Payer: Self-pay | Admitting: Nurse Practitioner

## 2021-01-07 ENCOUNTER — Other Ambulatory Visit: Payer: Self-pay | Admitting: Family

## 2021-01-25 ENCOUNTER — Other Ambulatory Visit: Payer: Self-pay | Admitting: Family

## 2021-01-29 ENCOUNTER — Encounter: Payer: 59 | Admitting: Family

## 2021-02-02 ENCOUNTER — Other Ambulatory Visit: Payer: Self-pay

## 2021-02-02 ENCOUNTER — Telehealth: Payer: Self-pay | Admitting: Family

## 2021-02-02 ENCOUNTER — Ambulatory Visit (INDEPENDENT_AMBULATORY_CARE_PROVIDER_SITE_OTHER): Payer: 59 | Admitting: Family

## 2021-02-02 ENCOUNTER — Encounter: Payer: Self-pay | Admitting: Family

## 2021-02-02 ENCOUNTER — Encounter: Payer: Self-pay | Admitting: *Deleted

## 2021-02-02 VITALS — BP 132/88 | HR 100 | Temp 98.7°F | Resp 12 | Ht 64.0 in | Wt 130.8 lb

## 2021-02-02 DIAGNOSIS — Z0000 Encounter for general adult medical examination without abnormal findings: Secondary | ICD-10-CM

## 2021-02-02 DIAGNOSIS — Z23 Encounter for immunization: Secondary | ICD-10-CM | POA: Diagnosis not present

## 2021-02-02 DIAGNOSIS — F419 Anxiety disorder, unspecified: Secondary | ICD-10-CM | POA: Diagnosis not present

## 2021-02-02 DIAGNOSIS — M2550 Pain in unspecified joint: Secondary | ICD-10-CM

## 2021-02-02 DIAGNOSIS — E785 Hyperlipidemia, unspecified: Secondary | ICD-10-CM | POA: Diagnosis not present

## 2021-02-02 DIAGNOSIS — F32A Depression, unspecified: Secondary | ICD-10-CM

## 2021-02-02 MED ORDER — MELOXICAM 7.5 MG PO TABS: 7.5000 mg | ORAL_TABLET | Freq: Every day | ORAL | 0 refills | Status: DC

## 2021-02-02 NOTE — Progress Notes (Signed)
Subjective:   By signing my name below, I, [name] [name], attest that this documentation has been prepared under the direction and in the presence of [name], NP, 02/02/2021   Patient ID: [name] [name], female    DOB: 1975/09/18, 45 y.o.   MRN: [id number]  Chief Complaint  Patient presents with   Annual Exam   pain left arm   Shoulder Pain    Right     HPI Patient is in today for a comprehensive physical exam.   Arm pain: She complains of arm pain that presents in both arms. She notes that in her left hand the pain has originated at the base of her thumb. It is aggravated by motions like hanging something up, picking up things, or grasping. She experiences the pain the worst in her elbow. She has had a history of broken knuckles and some trauma to her elbow.  Shoulder pain: She is also experiencing right shoulder pain and mentions that it could be caused by her sleeping on that side. She denies any fever, unexpected weight change, adenopathy, rash, hearing loss, ear pain, rhinorrhea, visual disturbances, eye pain, chest pain, leg swelling, cough, nausea, vomitting, diarrhea, blood in stool, dysuria, frequency, myalgias, arthralgias, headaches, depression or anxiety.  Patient presents today for complete physical. Immunizations: She has had 2 unspecified Covid-19 vaccinations and is interested in receiving the updated Covid-19 booster in the future. She is also interested in receiving her flu shot in the office today. She is UTD on tetanus.  Diet: She has been maintaining a healthy diet and drinks soda very rarely. She notes her water intake has increased since being home for work.  Exercise: She has not been maintaining a consistent exercise routine but hopes to return to exercise when she settles down from her move.  Colonoscopy: Performed on 12/14/2012 and results were normal. Repeat not necessary until age 7.  Pap Smear: Last performed on 09/22/2015. Repeat every 3  years. Due.  Mammogram: Last performed on 09/28/2017 and results were normal. Repeat scan after 1 year. Due.  Vision: She is UTD on vision.  Dental: She is UTD on dental Shx: No recent surgeries to note.  FMHx: No recent changes to her family medical history Alcohol: She rarely drinks alcohol.  Drugs: She does not use drugs. Tobacco: She does not use any tobacco products. Sexual health: She is engaged and uses condoms for birth control Work: She still works with [employer] Due  Topic Date Due   PAP SMEAR-Modifier  09/22/2018   COVID-19 Vaccine (3 - Booster for Moderna series) 05/12/2020   INFLUENZA VACCINE  11/30/2020    Past Medical History:  Diagnosis Date   Allergy    Anal fissure    Anxiety    Arthritis    Depression    External hemorrhoids     Past Surgical History:  Procedure Laterality Date   COLONOSCOPY N/A 12/14/2012   Procedure: COLONOSCOPY;  Surgeon: Lafayette Dragon, MD;  Location: [facility];  Service: Endoscopy;  Laterality: N/A;   ORIF FINGER / THUMB FRACTURE Right    WISDOM TOOTH EXTRACTION      Family History  Problem Relation Age of Onset   Protein C deficiency Sister    Deep vein thrombosis Sister    Pulmonary embolism Sister    Other Sister        pre-diabetes   Heart disease Paternal Grandmother    Colitis Mother    Basal cell carcinoma  Mother    Other Brother        pre-diabetes   Other Sister        pre-diabetes   Ankylosing spondylitis Sister    Crohn's disease Father        questionable diagnosis   Colon cancer Neg Hx    Cancer Neg Hx    Drug abuse Neg Hx    Stroke Neg Hx     Social History   Socioeconomic History   Marital status: Single    Spouse name: Not on file   Number of children: 0   Years of education: Not on file   Highest education level: Not on file  Occupational History    Employer: [employer]  Tobacco Use   Smoking status: Former    Types: Cigarettes    Quit date: 05/02/2001    Years  since quitting: 19.7   Smokeless tobacco: Never  Vaping Use   Vaping Use: Never used  Substance and Sexual Activity   Alcohol use: Yes    Comment: 3 drinks a week   Drug use: No   Sexual activity: Yes    Partners: Male    Birth control/protection: Condom  Other Topics Concern   Not on file  Social History Narrative   Works at [employer] with fiance   No children    Social Determinants of Radio broadcast assistant Strain: Not on file  Food Insecurity: Not on file  Transportation Needs: Not on file  Physical Activity: Not on file  Stress: Not on file  Social Connections: Not on file  Intimate Partner Violence: Not on file    Outpatient Medications Prior to Visit  Medication Sig Dispense Refill   cetirizine (ZYRTEC) 10 MG tablet Take 10 mg by mouth daily.       Fish Oil-Krill Oil (KRILL & FISH OIL BLEND) CAPS Take 1 capsule by mouth daily. 545m     Multiple Vitamins-Minerals (MULTIPLE VITAMINS/WOMENS) tablet Take by mouth daily.     omeprazole (PRILOSEC) 20 MG capsule Take 20 mg by mouth daily as needed.     Probiotic Product (ALIGN) 4 MG CAPS Take 1 capsule by mouth daily.     sertraline (ZOLOFT) 100 MG tablet TAKE 1 TABLET BY MOUTH TWICE A DAY 180 tablet 1   traZODone (DESYREL) 100 MG tablet TAKE 1 TABLET BY MOUTH EVERYDAY AT BEDTIME 90 tablet 0   clotrimazole-betamethasone (LOTRISONE) cream APPLY TO AFFECTED AREA TWICE A DAY 30 g 0   No facility-administered medications prior to visit.    No Known Allergies  Review of Systems  Constitutional:  Negative for fever.       (-) unexpected weight changes (-) adenopathy   HENT:  Negative for ear pain and hearing loss.        (-) rhinorrhea   Eyes:  Negative for pain.       (-) visual disturbances   Respiratory:  Negative for cough.   Cardiovascular:  Negative for chest pain and leg swelling.  Gastrointestinal:  Negative for blood in stool, diarrhea, nausea and vomiting.  Genitourinary:  Negative for  dysuria and frequency.  Musculoskeletal:  Positive for joint pain and myalgias (right shoulder pain).       (+) nerve pain in left thumb leading into left elbow  Skin:  Negative for rash.  Neurological:  Positive for headaches (since having Covid-19 they are more frequent).  Psychiatric/Behavioral:  Negative for depression (in left arm and right  shoulder). The patient is not nervous/anxious.       Objective:    Physical Exam Constitutional:      General: She is not in acute distress.    Appearance: Normal appearance. She is not ill-appearing.  HENT:     Head: Normocephalic and atraumatic.     Right Ear: External ear normal.     Left Ear: External ear normal.  Eyes:     Extraocular Movements: Extraocular movements intact.     Pupils: Pupils are equal, round, and reactive to light.  Cardiovascular:     Rate and Rhythm: Normal rate and regular rhythm.     Heart sounds: Normal heart sounds. No murmur heard.   No gallop.  Pulmonary:     Effort: Pulmonary effort is normal. No respiratory distress.     Breath sounds: Normal breath sounds. No wheezing or rales.  Lymphadenopathy:     Cervical: No cervical adenopathy.  Skin:    General: Skin is warm and dry.  Neurological:     Mental Status: She is alert and oriented to person, place, and time.     Deep Tendon Reflexes:     Reflex Scores:      Patellar reflexes are 2+ on the right side and 2+ on the left side. Psychiatric:        Behavior: Behavior normal.        Judgment: Judgment normal.    BP 132/88 (BP Location: Right Arm, Cuff Size: Large)   Pulse 100   Temp 98.7 F (37.1 C) (Oral)   Resp 12   Ht _0  (1.626 m)   Wt 130 lb 12.8 oz (59.3 kg)   LMP 01/08/2021   SpO2 100%   BMI 22.45 kg/m  Wt Readings from Last 3 Encounters:  02/02/21 130 lb 12.8 oz (59.3 kg)  11/20/19 142 lb (64.4 kg)  08/19/19 146 lb 2 oz (66.3 kg)       Assessment & Plan:   Problem List Items Addressed This Visit       Unprioritized    Preventative health care - Primary    Wt Readings from Last 3 Encounters:  02/02/21 130 lb 12.8 oz (59.3 kg)  11/20/19 142 lb (64.4 kg)  08/19/19 146 lb 2 oz (66.3 kg)  She has improved her water intake and diet and has lost 16 pounds. I commended her on her weight loss. She plans to get the covid bivalent booster at the pharmacy. Tetanus up to date. Refer for mammo. Will complete pap at her GYN's office. Flu shot today.      Arthralgia    New. Will rx with meloxicam and send to sports medicine for further evaluation.       Relevant Orders   Ambulatory referral to Sports Medicine   Anxiety and depression    Reports symptoms are stable on zoloft 183m bid. Continue same.      Other Visit Diagnoses     Hyperlipidemia, unspecified hyperlipidemia type       Relevant Orders   Comp Met (CMET)   Lipid panel      Meds ordered this encounter  Medications   meloxicam (MOBIC) 7.5 MG tablet    Sig: Take 1 tablet (7.5 mg total) by mouth daily.    Dispense:  14 tablet    Refill:  0    Order Specific Question:   Supervising Provider    Answer:   BMosie Lukes[[id number]]    I, MDebbrah Alar NP, personally  preformed the services described in this documentation.  All medical record entries made by the scribe were at my direction and in my presence.  I have reviewed the chart and discharge instructions (if applicable) and agree that the record reflects my personal performance and is accurate and complete. 02/02/2021  I,[name] [name],acting as a Education administrator for Nance Pear, NP.,have documented all relevant documentation on the behalf of Nance Pear, NP,as directed by  Nance Pear, NP while in the presence of Nance Pear, NP.  Nance Pear, NP

## 2021-02-02 NOTE — Assessment & Plan Note (Signed)
New. Will rx with meloxicam and send to sports medicine for further evaluation.

## 2021-02-02 NOTE — Telephone Encounter (Signed)
Request has been sent

## 2021-02-02 NOTE — Assessment & Plan Note (Signed)
Reports symptoms are stable on zoloft 100mg  bid. Continue same.

## 2021-02-02 NOTE — Telephone Encounter (Signed)
Please call Dr. Sandra Rivard's office and request copy of pap.  

## 2021-02-02 NOTE — Patient Instructions (Signed)
Please complete lab work prior to leaving. Continue healthy diet and try to add 30 minutes of exercise 5 days a week.

## 2021-02-02 NOTE — Assessment & Plan Note (Addendum)
Wt Readings from Last 3 Encounters:  02/02/21 130 lb 12.8 oz (59.3 kg)  11/20/19 142 lb (64.4 kg)  08/19/19 146 lb 2 oz (66.3 kg)   She has improved her water intake and diet and has lost 16 pounds. I commended her on her weight loss. She plans to get the covid bivalent booster at the pharmacy. Tetanus up to date. Refer for mammo. Will complete pap at her GYN's office. Flu shot today.

## 2021-02-02 NOTE — Addendum Note (Signed)
Addended by: Thelma Barge D on: 02/02/2021 01:39 PM   Modules accepted: Orders

## 2021-02-05 ENCOUNTER — Telehealth (HOSPITAL_BASED_OUTPATIENT_CLINIC_OR_DEPARTMENT_OTHER): Payer: Self-pay

## 2021-02-15 ENCOUNTER — Ambulatory Visit (INDEPENDENT_AMBULATORY_CARE_PROVIDER_SITE_OTHER): Payer: 59 | Admitting: Family Medicine

## 2021-02-15 ENCOUNTER — Encounter: Payer: Self-pay | Admitting: Family Medicine

## 2021-02-15 ENCOUNTER — Ambulatory Visit: Payer: Self-pay

## 2021-02-15 VITALS — Ht 64.0 in | Wt 130.0 lb

## 2021-02-15 DIAGNOSIS — M7541 Impingement syndrome of right shoulder: Secondary | ICD-10-CM | POA: Insufficient documentation

## 2021-02-15 DIAGNOSIS — M7712 Lateral epicondylitis, left elbow: Secondary | ICD-10-CM | POA: Diagnosis not present

## 2021-02-15 DIAGNOSIS — M79645 Pain in left finger(s): Secondary | ICD-10-CM

## 2021-02-15 DIAGNOSIS — G8929 Other chronic pain: Secondary | ICD-10-CM

## 2021-02-15 DIAGNOSIS — M659 Synovitis and tenosynovitis, unspecified: Secondary | ICD-10-CM

## 2021-02-15 MED ORDER — DICLOFENAC SODIUM 2 % EX SOLN: 1.0000 "application " | Freq: Two times a day (BID) | CUTANEOUS | 2 refills | Status: DC

## 2021-02-15 NOTE — Progress Notes (Signed)
[name] [name] - 45 y.o. female MRN [id number]  Date of birth: [birth year]  SUBJECTIVE:  Including CC & ROS.  No chief complaint on file.   [name] [name] is a 45 y.o. female that is presenting with left thumb pain, left elbow pain and right shoulder pain.   Review of Systems See HPI   HISTORY: Past Medical, Surgical, Social, and Family History Reviewed & Updated per EMR.   Pertinent Historical Findings include:  Past Medical History:  Diagnosis Date   Allergy    Anal fissure    Anxiety    Arthritis    Depression    External hemorrhoids     Past Surgical History:  Procedure Laterality Date   COLONOSCOPY N/A 12/14/2012   Procedure: COLONOSCOPY;  Surgeon: Hart Carwin, MD;  Location: [facility];  Service: Endoscopy;  Laterality: N/A;   ORIF FINGER / THUMB FRACTURE Right    WISDOM TOOTH EXTRACTION      Family History  Problem Relation Age of Onset   Protein C deficiency Sister    Deep vein thrombosis Sister    Pulmonary embolism Sister    Other Sister        pre-diabetes   Heart disease Paternal Grandmother    Colitis Mother    Basal cell carcinoma Mother    Other Brother        pre-diabetes   Other Sister        pre-diabetes   Ankylosing spondylitis Sister    Crohn's disease Father        questionable diagnosis   Colon cancer Neg Hx    Cancer Neg Hx    Drug abuse Neg Hx    Stroke Neg Hx     Social History   Socioeconomic History   Marital status: Single    Spouse name: Not on file   Number of children: 0   Years of education: Not on file   Highest education level: Not on file  Occupational History    Employer: [employer]  Tobacco Use   Smoking status: Former    Types: Cigarettes    Quit date: 05/02/2001    Years since quitting: 19.8   Smokeless tobacco: Never  Vaping Use   Vaping Use: Never used  Substance and Sexual Activity   Alcohol use: Yes    Comment: 3 drinks a week   Drug use: No   Sexual activity: Yes    Partners: Male     Birth control/protection: Condom  Other Topics Concern   Not on file  Social History Narrative   Works at [employer] with fiance   No children    Social Determinants of Corporate investment banker Strain: Not on file  Food Insecurity: Not on file  Transportation Needs: Not on file  Physical Activity: Not on file  Stress: Not on file  Social Connections: Not on file  Intimate Partner Violence: Not on file     PHYSICAL EXAM:  VS: Ht 5\' 4"  (1.626 m)   Wt 130 lb (59 kg)   BMI 22.31 kg/m  Physical Exam Gen: NAD, alert, cooperative with exam, well-appearing   Limited ultrasound: Left thumb, left elbow, right shoulder:  Left thumb: Mild effusion appreciated the [facility] joint. Normal dynamic testing of the MP joint Normal-appearing median nerve  Left elbow: Hypoechoic change and mild increased neovascularization at the lateral condyle  Right shoulder: Normal-appearing biceps tendon. Normal-appearing subscapularis. Instability appreciated on external rotation on dynamic testing  Summary: Capsulitis of thumb with tennis elbow and instability of the shoulder.  Ultrasound and interpretation by Clare Gandy, MD     ASSESSMENT & PLAN:   CMC (carpometacarpal) synovitis Symptoms seem more consistent with the [facility] joint as she has weakness with grip strength.  No changes of the median nerve. -Counseled on home exercise therapy and supportive care. -Brace provided today. -Pennsaid. -Could consider injection.  Lateral epicondylitis of left elbow Findings consistent with lateral epicondylitis. -Counseled on home exercise therapy and supportive care. -Pennsaid. -Could consider injection or shockwave for nitro patches  Shoulder impingement syndrome, right Appreciated in the posterior compartment. -Counseled on home exercise therapy and supportive care. -Could consider injection or physical therapy.

## 2021-02-15 NOTE — Assessment & Plan Note (Signed)
Symptoms seem more consistent with the [facility] joint as she has weakness with grip strength.  No changes of the median nerve. -Counseled on home exercise therapy and supportive care. -Brace provided today. -Pennsaid. -Could consider injection.

## 2021-02-15 NOTE — Assessment & Plan Note (Signed)
Findings consistent with lateral epicondylitis. -Counseled on home exercise therapy and supportive care. -Pennsaid. -Could consider injection or shockwave for nitro patches

## 2021-02-15 NOTE — Assessment & Plan Note (Signed)
Appreciated in the posterior compartment. -Counseled on home exercise therapy and supportive care. -Could consider injection or physical therapy.

## 2021-02-15 NOTE — Patient Instructions (Signed)
Nice to meet you Please try ice as needed  Please try the exercises  Please try the brace  Please try the rub on medicine   Please send me a message in MyChart with any questions or updates.  Please see me back in 4 weeks.   --Dr. Jordan Likes

## 2021-02-19 ENCOUNTER — Telehealth: Payer: Self-pay | Admitting: *Deleted

## 2021-02-19 NOTE — Telephone Encounter (Signed)
I spoke to patient re: fax from [facility]/covermymeds for Pennsaid 2% PA. The patient informed me that the pharmacy contacted her regarding payment and shipment. She stated she paid them an amount already but has not rec'd the med yet.  I called [facility]- the rep I spoke to stated they are ready to ship Pennsaid 2% to patient, but are waiting for a call back from her to make payment of $25. The rep also stated they "bypassed" the PA and applied a copay savings card for patient that lasts 3 months.  I called patient back and left her a detailed message informing her of this.

## 2021-03-02 ENCOUNTER — Ambulatory Visit: Payer: 59 | Admitting: Family Medicine

## 2021-03-17 ENCOUNTER — Encounter: Payer: Self-pay | Admitting: Family Medicine

## 2021-03-17 ENCOUNTER — Ambulatory Visit: Payer: Self-pay

## 2021-03-17 ENCOUNTER — Ambulatory Visit (INDEPENDENT_AMBULATORY_CARE_PROVIDER_SITE_OTHER): Payer: 59 | Admitting: Family Medicine

## 2021-03-17 VITALS — BP 110/74 | Ht 64.0 in | Wt 130.0 lb

## 2021-03-17 DIAGNOSIS — M659 Synovitis and tenosynovitis, unspecified: Secondary | ICD-10-CM | POA: Diagnosis not present

## 2021-03-17 DIAGNOSIS — M7712 Lateral epicondylitis, left elbow: Secondary | ICD-10-CM

## 2021-03-17 MED ORDER — METHYLPREDNISOLONE ACETATE 40 MG/ML IJ SUSP
40.0000 mg | Freq: Once | INTRAMUSCULAR | Status: AC
  Administered 2021-03-17: 40 mg via INTRA_ARTICULAR

## 2021-03-17 NOTE — Assessment & Plan Note (Signed)
Still acutely worsening today. -Counseled on home exercise therapy and supportive care. -Injection today. -Could consider PRP or shockwave.

## 2021-03-17 NOTE — Patient Instructions (Signed)
Good to see you Please use ice as needed on the elbow  Please continue the exercises  You can use the pennsaid for minimal pain   Please send me a message in MyChart with any questions or updates.  Please see me back in 4-6 weeks.   --Dr. Jordan Likes

## 2021-03-17 NOTE — Progress Notes (Signed)
[name] [name] - 45 y.o. female MRN [id number]  Date of birth: [birth year]  SUBJECTIVE:  Including CC & ROS.  No chief complaint on file.   [name] [name] is a 45 y.o. female that is  presenting with worsening left elbow pain. Has tried topical and home exercises. Pain localized to the elbow.    Review of Systems See HPI   HISTORY: Past Medical, Surgical, Social, and Family History Reviewed & Updated per EMR.   Pertinent Historical Findings include:  Past Medical History:  Diagnosis Date   Allergy    Anal fissure    Anxiety    Arthritis    Depression    External hemorrhoids     Past Surgical History:  Procedure Laterality Date   COLONOSCOPY N/A 12/14/2012   Procedure: COLONOSCOPY;  Surgeon: Hart Carwin, MD;  Location: [facility];  Service: Endoscopy;  Laterality: N/A;   ORIF FINGER / THUMB FRACTURE Right    WISDOM TOOTH EXTRACTION      Family History  Problem Relation Age of Onset   Protein C deficiency Sister    Deep vein thrombosis Sister    Pulmonary embolism Sister    Other Sister        pre-diabetes   Heart disease Paternal Grandmother    Colitis Mother    Basal cell carcinoma Mother    Other Brother        pre-diabetes   Other Sister        pre-diabetes   Ankylosing spondylitis Sister    Crohn's disease Father        questionable diagnosis   Colon cancer Neg Hx    Cancer Neg Hx    Drug abuse Neg Hx    Stroke Neg Hx     Social History   Socioeconomic History   Marital status: Single    Spouse name: Not on file   Number of children: 0   Years of education: Not on file   Highest education level: Not on file  Occupational History    Employer: [employer]  Tobacco Use   Smoking status: Former    Types: Cigarettes    Quit date: 05/02/2001    Years since quitting: 19.8   Smokeless tobacco: Never  Vaping Use   Vaping Use: Never used  Substance and Sexual Activity   Alcohol use: Yes    Comment: 3 drinks a week   Drug use: No   Sexual  activity: Yes    Partners: Male    Birth control/protection: Condom  Other Topics Concern   Not on file  Social History Narrative   Works at [employer] with fiance   No children    Social Determinants of Corporate investment banker Strain: Not on file  Food Insecurity: Not on file  Transportation Needs: Not on file  Physical Activity: Not on file  Stress: Not on file  Social Connections: Not on file  Intimate Partner Violence: Not on file     PHYSICAL EXAM:  VS: BP 110/74 (BP Location: Right Arm, Patient Position: Sitting)   Ht 5\' 4"  (1.626 m)   Wt 130 lb (59 kg)   BMI 22.31 kg/m  Physical Exam Gen: NAD, alert, cooperative with exam, well-appearing    Aspiration/Injection Procedure Note [name] [name] Nov 02, 1975  Procedure: Injection Indications: left elbow pain  Procedure Details Consent: Risks of procedure as well as the alternatives and risks of each were explained to the (patient/caregiver).  Consent  for procedure obtained. Time Out: Verified patient identification, verified procedure, site/side was marked, verified correct patient position, special equipment/implants available, medications/allergies/relevent history reviewed, required imaging and test results available.  Performed.  The area was cleaned with iodine and alcohol swabs.    The left lateral epicondyle was injected using 1 cc's of 40 mg depo-medrol and 2 cc's of 0.25% bupivacaine with a 25 1 1/2" needle.  Ultrasound was used. Images were obtained in long views showing the injection.     A sterile dressing was applied.  Patient did tolerate procedure well.     ASSESSMENT & PLAN:   CMC (carpometacarpal) synovitis Pain is still ongoing.  Likely exacerbated with the pain at the elbow.  She has to grip harder with the weakness that she experiences from the elbow. -Counseled on home exercise therapy and supportive care. -Could consider injection or physical therapy.  Lateral  epicondylitis of left elbow Still acutely worsening today. -Counseled on home exercise therapy and supportive care. -Injection today. -Could consider PRP or shockwave.

## 2021-03-17 NOTE — Assessment & Plan Note (Signed)
Pain is still ongoing.  Likely exacerbated with the pain at the elbow.  She has to grip harder with the weakness that she experiences from the elbow. -Counseled on home exercise therapy and supportive care. -Could consider injection or physical therapy.

## 2021-03-22 ENCOUNTER — Encounter (HOSPITAL_BASED_OUTPATIENT_CLINIC_OR_DEPARTMENT_OTHER): Payer: Self-pay

## 2021-03-22 ENCOUNTER — Other Ambulatory Visit: Payer: Self-pay

## 2021-03-22 ENCOUNTER — Ambulatory Visit (HOSPITAL_BASED_OUTPATIENT_CLINIC_OR_DEPARTMENT_OTHER)
Admission: RE | Admit: 2021-03-22 | Discharge: 2021-03-22 | Disposition: A | Discharge status: Home / Self Care | Payer: 59 | Source: Ambulatory Visit | Attending: Family | Admitting: Family

## 2021-03-22 DIAGNOSIS — Z0000 Encounter for general adult medical examination without abnormal findings: Secondary | ICD-10-CM | POA: Diagnosis not present

## 2021-03-22 DIAGNOSIS — Z1231 Encounter for screening mammogram for malignant neoplasm of breast: Secondary | ICD-10-CM | POA: Insufficient documentation

## 2021-04-01 ENCOUNTER — Other Ambulatory Visit: Payer: Self-pay | Admitting: Family

## 2021-04-16 ENCOUNTER — Other Ambulatory Visit: Payer: Self-pay | Admitting: Family

## 2021-04-27 ENCOUNTER — Ambulatory Visit (INDEPENDENT_AMBULATORY_CARE_PROVIDER_SITE_OTHER): Payer: 59 | Admitting: Family Medicine

## 2021-04-27 ENCOUNTER — Ambulatory Visit: Payer: Self-pay

## 2021-04-27 VITALS — BP 120/72 | Ht 64.0 in | Wt 130.0 lb

## 2021-04-27 DIAGNOSIS — M7711 Lateral epicondylitis, right elbow: Secondary | ICD-10-CM

## 2021-04-27 MED ORDER — METHYLPREDNISOLONE ACETATE 40 MG/ML IJ SUSP
40.0000 mg | Freq: Once | INTRAMUSCULAR | Status: AC
  Administered 2021-04-27: 09:00:00 40 mg via INTRA_ARTICULAR

## 2021-04-27 NOTE — Patient Instructions (Signed)
Good to see you  Please use ice as needed  Please try continue the exercises  Please send me a message in MyChart with any questions or updates.  Please see me back in 4-6 weeks.   --Dr. Jordan Likes

## 2021-04-27 NOTE — Assessment & Plan Note (Signed)
Acutely occurring with increased changes on ultrasound. -Counseled on home exercise therapy and supportive care. -Injection today. -Could consider physical therapy or nitro.

## 2021-04-27 NOTE — Progress Notes (Signed)
[name] [name] - 45 y.o. female MRN [id number]  Date of birth: 1975-07-02  SUBJECTIVE:  Including CC & ROS.  No chief complaint on file.   [name] [name] is a 45 y.o. female that is presenting with acute worsening of her right elbow pain.  It is worse with picking up any objects.  Localized to the lateral elbow.   Review of Systems See HPI   HISTORY: Past Medical, Surgical, Social, and Family History Reviewed & Updated per EMR.   Pertinent Historical Findings include:  Past Medical History:  Diagnosis Date   Allergy    Anal fissure    Anxiety    Arthritis    Depression    External hemorrhoids     Past Surgical History:  Procedure Laterality Date   COLONOSCOPY N/A 12/14/2012   Procedure: COLONOSCOPY;  Surgeon: Lafayette Dragon, MD;  Location: [facility];  Service: Endoscopy;  Laterality: N/A;   ORIF FINGER / THUMB FRACTURE Right    WISDOM TOOTH EXTRACTION      Family History  Problem Relation Age of Onset   Protein C deficiency Sister    Deep vein thrombosis Sister    Pulmonary embolism Sister    Other Sister        pre-diabetes   Heart disease Paternal Grandmother    Colitis Mother    Basal cell carcinoma Mother    Other Brother        pre-diabetes   Other Sister        pre-diabetes   Ankylosing spondylitis Sister    Crohn's disease Father        questionable diagnosis   Colon cancer Neg Hx    Cancer Neg Hx    Drug abuse Neg Hx    Stroke Neg Hx     Social History   Socioeconomic History   Marital status: Single    Spouse name: Not on file   Number of children: 0   Years of education: Not on file   Highest education level: Not on file  Occupational History    Employer: [employer]  Tobacco Use   Smoking status: Former    Types: Cigarettes    Quit date: 05/02/2001    Years since quitting: 20.0   Smokeless tobacco: Never  Vaping Use   Vaping Use: Never used  Substance and Sexual Activity   Alcohol use: Yes    Comment: 3 drinks a week   Drug  use: No   Sexual activity: Yes    Partners: Male    Birth control/protection: Condom  Other Topics Concern   Not on file  Social History Narrative   Works at [employer] with fiance   No children    Social Determinants of Radio broadcast assistant Strain: Not on file  Food Insecurity: Not on file  Transportation Needs: Not on file  Physical Activity: Not on file  Stress: Not on file  Social Connections: Not on file  Intimate Partner Violence: Not on file     PHYSICAL EXAM:  VS: BP 120/72    Ht 5\' 4"  (1.626 m)    Wt 130 lb (59 kg)    BMI 22.31 kg/m  Physical Exam Gen: NAD, alert, cooperative with exam, well-appearing   Limited ultrasound: Right elbow:  Increased hyperemia at the origin of the common extensors.  Summary: Findings consistent with lateral epicondylitis  Ultrasound and interpretation by Clearance Coots, MD    Aspiration/Injection Procedure Note Edmonia Lynch  Meinders February 21, 1976  Procedure: Injection Indications: Right elbow pain  Procedure Details Consent: Risks of procedure as well as the alternatives and risks of each were explained to the (patient/caregiver).  Consent for procedure obtained. Time Out: Verified patient identification, verified procedure, site/side was marked, verified correct patient position, special equipment/implants available, medications/allergies/relevent history reviewed, required imaging and test results available.  Performed.  The area was cleaned with iodine and alcohol swabs.    The right lateral epicondyle was injected using 1 cc's of 40 mg Depo-Medrol and 4 cc's of 0.25% bupivacaine with a 25 1 1/2" needle.  Ultrasound was used. Images were obtained in long views showing the injection.     A sterile dressing was applied.  Patient did tolerate procedure well.    ASSESSMENT & PLAN:   Lateral epicondylitis, right elbow Acutely occurring with increased changes on ultrasound. -Counseled on home exercise therapy  and supportive care. -Injection today. -Could consider physical therapy or nitro.

## 2021-06-09 ENCOUNTER — Encounter: Payer: Self-pay | Admitting: Family Medicine

## 2021-06-09 ENCOUNTER — Ambulatory Visit (INDEPENDENT_AMBULATORY_CARE_PROVIDER_SITE_OTHER): Payer: 59 | Admitting: Family Medicine

## 2021-06-09 VITALS — BP 100/70 | Ht 64.0 in | Wt 130.0 lb

## 2021-06-09 DIAGNOSIS — M7712 Lateral epicondylitis, left elbow: Secondary | ICD-10-CM

## 2021-06-09 MED ORDER — NITROGLYCERIN 0.2 MG/HR TD PT24: MEDICATED_PATCH | TRANSDERMAL | 11 refills | Status: DC

## 2021-06-09 NOTE — Patient Instructions (Signed)
Good to see you Please try the nitro patches  Please try the exercises   Please send me a message in MyChart with any questions or updates.  Please see me back in one week to have shockwave therapy.   --Dr. Raeford Razor  Nitroglycerin Protocol  Apply 1/4 nitroglycerin patch to affected area daily. Change position of patch within the affected area every 24 hours. You may experience a headache during the first 1-2 weeks of using the patch, these should subside. If you experience headaches after beginning nitroglycerin patch treatment, you may take your preferred over the counter pain reliever. Another side effect of the nitroglycerin patch is skin irritation or rash related to patch adhesive. Please notify our office if you develop more severe headaches or rash, and stop the patch. Tendon healing with nitroglycerin patch may require 12 to 24 weeks depending on the extent of injury. Men should not use if taking Viagra, Cialis, or Levitra.  Do not use if you have migraines or rosacea.

## 2021-06-09 NOTE — Assessment & Plan Note (Signed)
Acutely worsening.  Did get improvement with previous steroid injection but pain is starting to return. -Counseled on home exercise therapy and supportive care. -Shockwave therapy. -Nitro patches. -Could consider PRP or prolotherapy

## 2021-06-09 NOTE — Progress Notes (Signed)
°  KESLEIGH MORSON - 46 y.o. female MRN [id number]  Date of birth: 06/26/1975  SUBJECTIVE:  Including CC & ROS.  No chief complaint on file.   ANALAYA HOEY is a 46 y.o. female that is presenting with acute on chronic left elbow pain.  She did get relief with the steroid injection but the pain has slowly started to return.  She is having pain with extension maneuvers of the wrist.   Review of Systems See HPI   HISTORY: Past Medical, Surgical, Social, and Family History Reviewed & Updated per EMR.   Pertinent Historical Findings include:  Past Medical History:  Diagnosis Date   Allergy    Anal fissure    Anxiety    Arthritis    Depression    External hemorrhoids     Past Surgical History:  Procedure Laterality Date   COLONOSCOPY N/A 12/14/2012   Procedure: COLONOSCOPY;  Surgeon: Hart Carwin, MD;  Location: [facility];  Service: Endoscopy;  Laterality: N/A;   ORIF FINGER / THUMB FRACTURE Right    WISDOM TOOTH EXTRACTION       PHYSICAL EXAM:  VS: BP 100/70 (BP Location: Left Arm, Patient Position: Sitting)    Ht 5\' 4"  (1.626 m)    Wt 130 lb (59 kg)    BMI 22.31 kg/m  Physical Exam Gen: NAD, alert, cooperative with exam, well-appearing MSK:  Neurovascularly intact    ECSWT Note MURDIS FLITTON 09-21-1975  Procedure: ECSWT Indications: left elbow pain   Procedure Details Consent: Risks of procedure as well as the alternatives and risks of each were explained to the (patient/caregiver).  Consent for procedure obtained. Time Out: Verified patient identification, verified procedure, site/side was marked, verified correct patient position, special equipment/implants available, medications/allergies/relevent history reviewed, required imaging and test results available.  Performed.  The area was cleaned with iodine and alcohol swabs.    The left elbow was targeted for Extracorporeal shockwave therapy.   Preset: Lateral epicondylitis Power Level: 70 Frequency:  10 Impulse/cycles: 2000 Head size: Small Session: 1st  Patient did tolerate procedure well.    ASSESSMENT & PLAN:   Lateral epicondylitis of left elbow Acutely worsening.  Did get improvement with previous steroid injection but pain is starting to return. -Counseled on home exercise therapy and supportive care. -Shockwave therapy. -Nitro patches. -Could consider PRP or prolotherapy

## 2021-06-15 ENCOUNTER — Encounter: Payer: Self-pay | Admitting: Family Medicine

## 2021-06-15 ENCOUNTER — Ambulatory Visit (INDEPENDENT_AMBULATORY_CARE_PROVIDER_SITE_OTHER): Payer: 59 | Admitting: Family Medicine

## 2021-06-15 DIAGNOSIS — M7711 Lateral epicondylitis, right elbow: Secondary | ICD-10-CM

## 2021-06-15 NOTE — Assessment & Plan Note (Signed)
Completed shockwave therapy  

## 2021-06-15 NOTE — Progress Notes (Signed)
°  JASEMINE FORNI - 46 y.o. female MRN [id number]  Date of birth: June 11, 1975  SUBJECTIVE:  Including CC & ROS.  No chief complaint on file.   BAYLOR RANUM is a 46 y.o. female that is here for shockwave therapy.   Review of Systems See HPI   HISTORY: Past Medical, Surgical, Social, and Family History Reviewed & Updated per EMR.   Pertinent Historical Findings include:  Past Medical History:  Diagnosis Date   Allergy    Anal fissure    Anxiety    Arthritis    Depression    External hemorrhoids     Past Surgical History:  Procedure Laterality Date   COLONOSCOPY N/A 12/14/2012   Procedure: COLONOSCOPY;  Surgeon: Lafayette Dragon, MD;  Location: [facility];  Service: Endoscopy;  Laterality: N/A;   ORIF FINGER / THUMB FRACTURE Right    WISDOM TOOTH EXTRACTION       PHYSICAL EXAM:  VS: Ht 5\' 4"  (1.626 m)    Wt 130 lb (59 kg)    BMI 22.31 kg/m  Physical Exam Gen: NAD, alert, cooperative with exam, well-appearing MSK:  Neurovascularly intact    ECSWT Note DENISS FREUNDLICH 1975/10/02  Procedure: ECSWT Indications: left elbow pain   Procedure Details Consent: Risks of procedure as well as the alternatives and risks of each were explained to the (patient/caregiver).  Consent for procedure obtained. Time Out: Verified patient identification, verified procedure, site/side was marked, verified correct patient position, special equipment/implants available, medications/allergies/relevent history reviewed, required imaging and test results available.  Performed.  The area was cleaned with iodine and alcohol swabs.    The left elbow was targeted for Extracorporeal shockwave therapy.   Preset: Lateral epicondylitis Power Level: 80 Frequency: 10 Impulse/cycles: 2200 Head size: Small Session: 2nd  Patient did tolerate procedure well.    ASSESSMENT & PLAN:   Lateral epicondylitis, right elbow Completed shockwave therapy

## 2021-06-22 ENCOUNTER — Ambulatory Visit (INDEPENDENT_AMBULATORY_CARE_PROVIDER_SITE_OTHER): Payer: 59 | Admitting: Family Medicine

## 2021-06-22 ENCOUNTER — Encounter: Payer: Self-pay | Admitting: Family Medicine

## 2021-06-22 DIAGNOSIS — M7712 Lateral epicondylitis, left elbow: Secondary | ICD-10-CM

## 2021-06-22 NOTE — Assessment & Plan Note (Signed)
Completed shockwave therapy  

## 2021-06-22 NOTE — Progress Notes (Signed)
°  [name] [name] - 46 y.o. female MRN [id number]  Date of birth: Sep 15, 1975  SUBJECTIVE:  Including CC & ROS.  No chief complaint on file.   [name] [name] is a 46 y.o. female that is here for shockwave therapy.    Review of Systems See HPI   HISTORY: Past Medical, Surgical, Social, and Family History Reviewed & Updated per EMR.   Pertinent Historical Findings include:  Past Medical History:  Diagnosis Date   Allergy    Anal fissure    Anxiety    Arthritis    Depression    External hemorrhoids     Past Surgical History:  Procedure Laterality Date   COLONOSCOPY N/A 12/14/2012   Procedure: COLONOSCOPY;  Surgeon: Hart Carwin, MD;  Location: [facility];  Service: Endoscopy;  Laterality: N/A;   ORIF FINGER / THUMB FRACTURE Right    WISDOM TOOTH EXTRACTION       PHYSICAL EXAM:  VS: Ht 5\' 4"  (1.626 m)    Wt 130 lb (59 kg)    BMI 22.31 kg/m  Physical Exam Gen: NAD, alert, cooperative with exam, well-appearing MSK:  Neurovascularly intact    ECSWT Note [name] [name] [birth year]  Procedure: ECSWT Indications: left elbow pain  Procedure Details Consent: Risks of procedure as well as the alternatives and risks of each were explained to the (patient/caregiver).  Consent for procedure obtained. Time Out: Verified patient identification, verified procedure, site/side was marked, verified correct patient position, special equipment/implants available, medications/allergies/relevent history reviewed, required imaging and test results available.  Performed.  The area was cleaned with iodine and alcohol swabs.    The left elbow was targeted for Extracorporeal shockwave therapy.   Preset: lateral epicondylitis  Power Level: 90 Frequency: 10 Impulse/cycles: 2400 Head size: small  Session: 3rd  Patient did tolerate procedure well.    ASSESSMENT & PLAN:   Lateral epicondylitis of left elbow Completed shockwave therapy

## 2021-06-30 ENCOUNTER — Other Ambulatory Visit: Payer: Self-pay | Admitting: Family

## 2021-08-17 LAB — HM PAP SMEAR: HM Pap smear: NEGATIVE

## 2021-09-15 ENCOUNTER — Other Ambulatory Visit: Payer: Self-pay | Admitting: Family

## 2021-09-26 ENCOUNTER — Other Ambulatory Visit: Payer: Self-pay | Admitting: Family

## 2021-12-07 ENCOUNTER — Other Ambulatory Visit: Payer: Self-pay | Admitting: Family

## 2022-02-21 ENCOUNTER — Other Ambulatory Visit: Payer: Self-pay | Admitting: Family

## 2022-02-22 ENCOUNTER — Encounter: Payer: Self-pay | Admitting: *Deleted

## 2022-03-18 ENCOUNTER — Encounter: Payer: Self-pay | Admitting: *Deleted

## 2022-03-30 NOTE — Progress Notes (Signed)
Office Visit Note  Patient: [name] [name]             Date of Birth: Jul 31, 1975           MRN: [id number]             PCP: [name], NP Referring: Delsa Bern, MD Visit Date: 04/11/2022 Occupation: _0 @  Subjective:  Pain in multiple joints  History of Present Illness: [name] [name] is a 46 y.o. female seen in consultation per request of her PCP.  According the patient her symptoms a started last year around July with left elbow pain.  Usually the pain moved to her left hand and she was having difficulty gripping objects.  She was evaluated by Dr. Raeford Razor who diagnosed her with [facility] arthritis, left lateral epicondylitis and right shoulder joint impingement.  She was given topical steroids and braces.  November 2022 she had left lateral epicondylitis injection which did not help.  He was given right lateral epicondylitis injection in December 2022.  He tried extracorporeal shockwave therapy on her left elbow in February 2023 which did not help.  She was also given Nitropatch which she could not tolerate.  Continues to have pain and discomfort in her right shoulder joint.  She has difficulty sleeping on the right side.  No difficulty raising her arm.  She continues to have pain and discomfort in her bilateral elbows and her bilateral hands which she describes over the [facility] joints.  Having some discomfort in bilateral MCPs especially bilateral second MCP joints.  Some discomfort in her right big toe.  None of the other joints are painful.  She has known history of degenerative disc disease of lumbar spine and has chronic discomfort.  She gives history of bilateral SI joint pain and morning stiffness.  She also gives history of recurrent rash on her scalp.  She states her dermatologist suspected psoriasis in the past.  She uses topical agents which clears up the rash.  She has an appointment coming up with a dermatologist next week and she will confirm the diagnosis.  She denies  any history of uveitis, dactylitis, Planter fasciitis or Achilles tendinitis.  She has seen an orthopedic surgeon in the past.  According to the patient her sister has ankylosing spondylitis and she is HLA-B27 positive.  She states that her mother has inflammatory arthritis but she is uncertain about the diagnosis.  She is gravida 0, para 0.  Activities of Daily Living:  Patient reports morning stiffness for a few minutes.   Patient Reports nocturnal pain.  Difficulty dressing/grooming: Denies Difficulty climbing stairs: Denies Difficulty getting out of chair: Denies Difficulty using hands for taps, buttons, cutlery, and/or writing: Reports  Review of Systems  Constitutional:  Positive for fatigue.  HENT:  Positive for mouth dryness. Negative for mouth sores.   Eyes:  Negative for dryness.  Respiratory:  Negative for shortness of breath.   Cardiovascular:  Negative for chest pain and palpitations.  Gastrointestinal:  Positive for constipation and diarrhea. Negative for blood in stool.  Endocrine: Negative for increased urination.  Genitourinary:  Negative for involuntary urination.  Musculoskeletal:  Positive for joint pain, joint pain, joint swelling, myalgias, morning stiffness and myalgias. Negative for gait problem, muscle weakness and muscle tenderness.  Skin:  Negative for color change, rash, hair loss and sensitivity to sunlight.  Allergic/Immunologic: Negative for susceptible to infections.  Neurological:  Positive for headaches. Negative for dizziness.  Hematological:  Negative for swollen glands.  Psychiatric/Behavioral:  Positive for sleep disturbance. Negative for depressed mood. The patient is not nervous/anxious.     PMFS History:  Patient Active Problem List   Diagnosis Date Noted   DDD (degenerative disc disease), lumbar 04/11/2022   Lateral epicondylitis, right elbow 04/27/2021   CMC (carpometacarpal) synovitis 02/15/2021   Lateral epicondylitis of left elbow  02/15/2021   Shoulder impingement syndrome, right 02/15/2021   Preventative health care 02/02/2021   Arthralgia 02/02/2021   Thumb injury, right, initial encounter 11/08/2017   Sciatica 07/10/2014   Low back pain 05/10/2012   UNSPECIFIED MYALGIA AND MYOSITIS 04/28/2008   Anxiety and depression 09/08/2006   Depressive disorder, not elsewhere classified 09/08/2006   ALLERGIC RHINITIS 09/06/2006   INSOMNIA 09/06/2006    Past Medical History:  Diagnosis Date   Allergy    Anal fissure    Anxiety    Arthritis    Depression    External hemorrhoids     Family History  Problem Relation Age of Onset   Colitis Mother    Basal cell carcinoma Mother    Arthritis Mother    Crohn's disease Father        questionable diagnosis   Protein C deficiency Sister    Deep vein thrombosis Sister    Pulmonary embolism Sister    Other Sister        pre-diabetes   Other Sister        pre-diabetes   Ankylosing spondylitis Sister    Colitis Sister    Other Brother        pre-diabetes   Heart disease Paternal Grandmother    Colon cancer Neg Hx    Cancer Neg Hx    Drug abuse Neg Hx    Stroke Neg Hx    Past Surgical History:  Procedure Laterality Date   COLONOSCOPY N/A 12/14/2012   Procedure: COLONOSCOPY;  Surgeon: Lafayette Dragon, MD;  Location: [facility];  Service: Endoscopy;  Laterality: N/A;   ORIF FINGER / THUMB FRACTURE Right    WISDOM TOOTH EXTRACTION     Social History   Social History Narrative   Works at [employer] with fiance   No children    Immunization History  Administered Date(s) Administered   Influenza,inj,Quad PF,6+ Mos 03/27/2017, 04/03/2018, 02/26/2019, 02/02/2021   Moderna Sars-Covid-2 Vaccination 11/13/2019, 12/11/2019   Td 06/29/2009, 11/06/2017     Objective: Vital Signs: BP 126/83 (BP Location: Right Arm, Patient Position: Sitting, Cuff Size: Normal)   Pulse 83   Resp 14   Ht _0  (1.6 m)   Wt 145 lb 9.6 oz (66 kg)   BMI 25.79 kg/m     Physical Exam Vitals and nursing note reviewed.  Constitutional:      Appearance: She is well-developed.  HENT:     Head: Normocephalic and atraumatic.  Eyes:     Conjunctiva/sclera: Conjunctivae normal.  Cardiovascular:     Rate and Rhythm: Normal rate and regular rhythm.     Heart sounds: Normal heart sounds.  Pulmonary:     Effort: Pulmonary effort is normal.     Breath sounds: Normal breath sounds.  Abdominal:     General: Bowel sounds are normal.     Palpations: Abdomen is soft.  Musculoskeletal:     Cervical back: Normal range of motion.  Lymphadenopathy:     Cervical: No cervical adenopathy.  Skin:    General: Skin is warm and dry.     Capillary Refill: Capillary refill takes less than  2 seconds.     Comments: Dry skin patch was noted on her scalp on the right parietal region.  Neurological:     Mental Status: She is alert and oriented to person, place, and time.  Psychiatric:        Behavior: Behavior normal.      Musculoskeletal Exam: Cervical, thoracic and lumbar spine were in good range of motion.  She had tenderness over bilateral SI joints.  Shoulder joints were in good range of motion.  She had discomfort with range of motion of her right shoulder joint over the anterior aspect.  She had tenderness over bilateral lateral epicondyle region.  No synovitis was noted over elbows.  Wrist joints were in good range of motion.  She had tenderness over bilateral CMC joints.  Synovitis was noted over left second MCP joint.  She describes tenderness in all of her MCP joints.  There was no tenderness over PIPs or DIPs.  No dactylitis was noted.  Hip joints and knee joints in good range of motion without any warmth swelling or effusion.  She had tenderness over the right first MTP joint.  No synovitis was noted.  There was no planter fasciitis or Achilles tendinitis.  CDAI Exam: CDAI Score: -- Patient Global: --; Provider Global: -- Swollen: --; Tender: -- Joint Exam  04/11/2022   No joint exam has been documented for this visit   There is currently no information documented on the homunculus. Go to the Rheumatology activity and complete the homunculus joint exam.  Investigation: No additional findings.  Imaging: No results found.  Recent Labs: Lab Results  Component Value Date   WBC 8.5 07/10/2019   HGB 12.8 07/10/2019   PLT 225.0 07/10/2019   NA 137 07/10/2019   K 3.8 07/10/2019   CL 107 07/10/2019   CO2 25 07/10/2019   GLUCOSE 81 07/10/2019   BUN 13 07/10/2019   CREATININE 0.75 07/10/2019   BILITOT 0.5 07/10/2019   ALKPHOS 40 07/10/2019   AST 17 07/10/2019   ALT 15 07/10/2019   PROT 6.6 07/10/2019   ALBUMIN 4.3 07/10/2019   CALCIUM 8.7 07/10/2019   GFRAA  10/05/2008    >60        The eGFR has been calculated using the MDRD equation. This calculation has not been validated in all clinical situations. eGFR's persistently <60 mL/min signify possible Chronic Kidney Disease.    Speciality Comments: No specialty comments available.  Procedures:  No procedures performed Allergies: Patient has no known allergies.   Assessment / Plan:     Visit Diagnoses: Chronic inflammatory arthritis-patient complains of pain and discomfort in multiple joints over the last 1 year.  She states she has been having pain and discomfort in her right shoulder joint and has difficulty lifting her right shoulder.  She also has pain and swelling in her bilateral hands which she describes over bilateral second MCP joints.  She has had chronic discomfort in the base of her thumb which she describes over [facility] joints.  She has been having intermittent discomfort in her right great toe.  Shoulder impingement syndrome, right -she had limited painful range of motion of her right shoulder joint.  She had tenderness over the anterior aspect of her right shoulder joint.  Plan: XR Shoulder Right  Lateral epicondylitis of both elbows-she had tenderness over bilateral  lateral epicondyle region.  No synovitis was noted.  Pain in both hands -she complains of pain and discomfort in her bilateral hands.  She  had tenderness and swelling over her left second MCP joint.  She had tenderness over bilateral CMC joints.  Plan: XR Hand 2 View Right, XR Hand 2 View Left, x-rays showed osteoarthritic changes.  Sedimentation rate, Rheumatoid factor, Cyclic citrul peptide antibody, IgG, ANA, Uric acid  Pain in both feet -she complains of pain and discomfort in the right first MTP joint.  Plan: XR Foot 2 Views Right, XR Foot 2 Views Left.  X-rays of bilateral feet were unremarkable.  Chronic SI joint pain -she complains of morning stiffness and discomfort in her bilateral SI joints.  Plan: XR Pelvis 1-2 Views, x-rays of SI joints were unremarkable.  HLA-B27 antigen  DDD (degenerative disc disease), lumbar-she complains of chronic lower back pain for many years.  She has history of disc disease of lumbar spine.  High risk medication use -in anticipation to start her on immunosuppressive therapy I will obtain following labs today.  Plan: CBC with Differential/Platelet, COMPLETE METABOLIC PANEL WITH GFR, Hepatitis B core antibody, IgM, Hepatitis B surface antigen, Hepatitis C antibody, QuantiFERON-TB Gold Plus, Serum protein electrophoresis with reflex, IgG, IgA, IgM  Rash-has seen a dermatologist in the past.  She had a dry patch on her scalp.  Patient states that she may have psoriasis.  She has an appointment coming up with a dermatologist next week.  I advised her to confirm the diagnosis.  Myalgia -she states that she had myalgias for many years.  Myalgias improved when she is on antidepressants.  Plan: CK  Other fatigue -she complains of increased fatigue.  Plan: TSH  Adjustment insomnia-related to fatigue.  Anxiety and depression-controlled on medications.  Family history of ankylosing spondylitis-Sister - According to the patient her sister has ankylosing spondylitis  and she is HLA-B27 positive.  Patient states her mother also has inflammatory arthritis.  Orders: Orders Placed This Encounter  Procedures   XR Hand 2 View Right   XR Hand 2 View Left   XR Shoulder Right   XR Pelvis 1-2 Views   XR Foot 2 Views Right   XR Foot 2 Views Left   CBC with Differential/Platelet   COMPLETE METABOLIC PANEL WITH GFR   Sedimentation rate   CK   TSH   Rheumatoid factor   Cyclic citrul peptide antibody, IgG   ANA   Uric acid   HLA-B27 antigen   Hepatitis B core antibody, IgM   Hepatitis B surface antigen   Hepatitis C antibody   QuantiFERON-TB Gold Plus   Serum protein electrophoresis with reflex   IgG, IgA, IgM   No orders of the defined types were placed in this encounter.    Follow-Up Instructions: Return for Chronic inflammatory arthritis.   Bo Merino, MD  Note - This record has been created using Editor, commissioning.  Chart creation errors have been sought, but may not always  have been located. Such creation errors do not reflect on  the standard of medical care.

## 2022-04-07 ENCOUNTER — Other Ambulatory Visit: Payer: Self-pay | Admitting: Family

## 2022-04-11 ENCOUNTER — Ambulatory Visit: Payer: 59 | Attending: Rheumatology | Admitting: Rheumatology

## 2022-04-11 ENCOUNTER — Ambulatory Visit (INDEPENDENT_AMBULATORY_CARE_PROVIDER_SITE_OTHER): Payer: 59

## 2022-04-11 ENCOUNTER — Ambulatory Visit: Payer: 59

## 2022-04-11 ENCOUNTER — Encounter: Payer: Self-pay | Admitting: Rheumatology

## 2022-04-11 VITALS — BP 126/83 | HR 83 | Resp 14 | Ht 63.0 in | Wt 145.6 lb

## 2022-04-11 DIAGNOSIS — M533 Sacrococcygeal disorders, not elsewhere classified: Secondary | ICD-10-CM | POA: Diagnosis not present

## 2022-04-11 DIAGNOSIS — M791 Myalgia, unspecified site: Secondary | ICD-10-CM

## 2022-04-11 DIAGNOSIS — M79671 Pain in right foot: Secondary | ICD-10-CM

## 2022-04-11 DIAGNOSIS — M7711 Lateral epicondylitis, right elbow: Secondary | ICD-10-CM | POA: Diagnosis not present

## 2022-04-11 DIAGNOSIS — M79642 Pain in left hand: Secondary | ICD-10-CM

## 2022-04-11 DIAGNOSIS — G8929 Other chronic pain: Secondary | ICD-10-CM

## 2022-04-11 DIAGNOSIS — M1990 Unspecified osteoarthritis, unspecified site: Secondary | ICD-10-CM | POA: Diagnosis not present

## 2022-04-11 DIAGNOSIS — M79641 Pain in right hand: Secondary | ICD-10-CM

## 2022-04-11 DIAGNOSIS — M7712 Lateral epicondylitis, left elbow: Secondary | ICD-10-CM

## 2022-04-11 DIAGNOSIS — F32A Depression, unspecified: Secondary | ICD-10-CM

## 2022-04-11 DIAGNOSIS — F419 Anxiety disorder, unspecified: Secondary | ICD-10-CM

## 2022-04-11 DIAGNOSIS — M659 Synovitis and tenosynovitis, unspecified: Secondary | ICD-10-CM

## 2022-04-11 DIAGNOSIS — M7541 Impingement syndrome of right shoulder: Secondary | ICD-10-CM

## 2022-04-11 DIAGNOSIS — F5102 Adjustment insomnia: Secondary | ICD-10-CM

## 2022-04-11 DIAGNOSIS — M5136 Other intervertebral disc degeneration, lumbar region: Secondary | ICD-10-CM

## 2022-04-11 DIAGNOSIS — Z8269 Family history of other diseases of the musculoskeletal system and connective tissue: Secondary | ICD-10-CM

## 2022-04-11 DIAGNOSIS — R5383 Other fatigue: Secondary | ICD-10-CM

## 2022-04-11 DIAGNOSIS — M79672 Pain in left foot: Secondary | ICD-10-CM

## 2022-04-11 DIAGNOSIS — R21 Rash and other nonspecific skin eruption: Secondary | ICD-10-CM

## 2022-04-11 DIAGNOSIS — M2550 Pain in unspecified joint: Secondary | ICD-10-CM

## 2022-04-11 DIAGNOSIS — Z79899 Other long term (current) drug therapy: Secondary | ICD-10-CM

## 2022-04-20 LAB — SEDIMENTATION RATE: Sed Rate: 2 mm/h (ref 0–20)

## 2022-04-20 LAB — QUANTIFERON-TB GOLD PLUS
Mitogen-NIL: >10 IU/mL
NIL: 0.01 IU/mL
QuantiFERON-TB Gold Plus: NEGATIVE
TB1-NIL: 0 IU/mL
TB2-NIL: 0 IU/mL

## 2022-04-20 LAB — COMPLETE METABOLIC PANEL WITH GFR
AG Ratio: 2.1 (calc) (ref 1.0–2.5)
ALT: 19 U/L (ref 6–29)
AST: 19 U/L (ref 10–30)
Albumin: 5 g/dL (ref 3.6–5.1)
Alkaline phosphatase (APISO): 44 U/L (ref 31–125)
BUN: 13 mg/dL (ref 7–25)
CO2: 27 mmol/L (ref 20–32)
Calcium: 9.7 mg/dL (ref 8.6–10.2)
Chloride: 102 mmol/L (ref 98–110)
Creat: 0.83 mg/dL (ref 0.50–0.99)
Globulin: 2.4 g/dL (calc) (ref 1.9–3.7)
Glucose, Bld: 81 mg/dL (ref 65–99)
Potassium: 4 mmol/L (ref 3.5–5.3)
Sodium: 139 mmol/L (ref 135–146)
Total Bilirubin: 0.5 mg/dL (ref 0.2–1.2)
Total Protein: 7.4 g/dL (ref 6.1–8.1)
eGFR: 89 mL/min/{1.73_m2} (ref >=60)

## 2022-04-20 LAB — CYCLIC CITRUL PEPTIDE ANTIBODY, IGG: Cyclic Citrullin Peptide Ab: <16 UNITS

## 2022-04-20 LAB — CBC WITH DIFFERENTIAL/PLATELET
Absolute Monocytes: 524 cells/uL (ref 200–950)
Basophils Absolute: 48 cells/uL (ref 0–200)
Basophils Relative: 0.7 %
Eosinophils Absolute: 338 cells/uL (ref 15–500)
Eosinophils Relative: 4.9 %
HCT: 40.7 % (ref 35.0–45.0)
Hemoglobin: 13.4 g/dL (ref 11.7–15.5)
Lymphs Abs: 1484 cells/uL (ref 850–3900)
MCH: 28.9 pg (ref 27.0–33.0)
MCHC: 32.9 g/dL (ref 32.0–36.0)
MCV: 87.9 fL (ref 80.0–100.0)
MPV: 10 fL (ref 7.5–12.5)
Monocytes Relative: 7.6 %
Neutro Abs: 4506 cells/uL (ref 1500–7800)
Neutrophils Relative %: 65.3 %
Platelets: 230 10*3/uL (ref 140–400)
RBC: 4.63 10*6/uL (ref 3.80–5.10)
RDW: 12.1 % (ref 11.0–15.0)
Total Lymphocyte: 21.5 %
WBC: 6.9 10*3/uL (ref 3.8–10.8)

## 2022-04-20 LAB — ANA: Anti Nuclear Antibody (ANA): NEGATIVE

## 2022-04-20 LAB — PROTEIN ELECTROPHORESIS, SERUM, WITH REFLEX
Albumin ELP: 4.9 g/dL — ABNORMAL HIGH (ref 3.8–4.8)
Alpha 1: 0.3 g/dL (ref 0.2–0.3)
Alpha 2: 0.6 g/dL (ref 0.5–0.9)
Beta 2: 0.2 g/dL (ref 0.2–0.5)
Beta Globulin: 0.4 g/dL (ref 0.4–0.6)
Gamma Globulin: 0.7 g/dL — ABNORMAL LOW (ref 0.8–1.7)
Total Protein: 7.1 g/dL (ref 6.1–8.1)

## 2022-04-20 LAB — HEPATITIS C ANTIBODY: Hepatitis C Ab: NONREACTIVE

## 2022-04-20 LAB — IGG, IGA, IGM
IgG (Immunoglobin G), Serum: 774 mg/dL (ref 600–1640)
IgM, Serum: 102 mg/dL (ref 50–300)
Immunoglobulin A: 85 mg/dL (ref 47–310)

## 2022-04-20 LAB — CK: Total CK: 81 U/L (ref 29–143)

## 2022-04-20 LAB — IFE INTERPRETATION

## 2022-04-20 LAB — HEPATITIS B CORE ANTIBODY, IGM: Hep B C IgM: NONREACTIVE

## 2022-04-20 LAB — HLA-B27 ANTIGEN: HLA-B27 Antigen: POSITIVE — AB

## 2022-04-20 LAB — RHEUMATOID FACTOR: Rheumatoid fact SerPl-aCnc: <14 IU/mL (ref <14)

## 2022-04-20 LAB — URIC ACID: Uric Acid, Serum: 4.2 mg/dL (ref 2.5–7.0)

## 2022-04-20 LAB — TSH: TSH: 3.5 mIU/L

## 2022-04-20 LAB — HEPATITIS B SURFACE ANTIGEN: Hepatitis B Surface Ag: NONREACTIVE

## 2022-04-20 NOTE — Progress Notes (Signed)
Office Visit Note  Patient: [name] [name]             Date of Birth: Mar 23, 1976           MRN: [id number]             PCP: [name], NP Referring: [name], NP Visit Date: 05/03/2022 Occupation: _0 @  Subjective:  Pain in multiple joints and psoriasis  History of Present Illness: [name] [name] is a 46 y.o. female history of chronic inflammatory arthritis.  She states after her last visit she went to see Dr. Delman Cheadle who diagnosed her with psoriasis.  She has not been using topical agents and the psoriasis is flaring on her scalp.  She does not have psoriasis on her trunk and extremities.  She continues to have pain and discomfort over bilateral lateral epicondyle region bilateral SI joints, left second MCP joint and her feet.  Lower back pain persist.    Activities of Daily Living:  Patient reports morning stiffness for 30 minutes.   Patient Reports nocturnal pain.  Difficulty dressing/grooming: Denies Difficulty climbing stairs: Denies Difficulty getting out of chair: Denies Difficulty using hands for taps, buttons, cutlery, and/or writing: Denies  Review of Systems  Constitutional:  Negative for fatigue.  HENT:  Negative for mouth sores and mouth dryness.   Eyes:  Negative for dryness.  Respiratory:  Negative for shortness of breath.   Cardiovascular:  Negative for chest pain and palpitations.  Gastrointestinal:  Negative for blood in stool, constipation and diarrhea.  Endocrine: Negative for increased urination.  Genitourinary:  Negative for difficulty urinating.  Musculoskeletal:  Positive for joint pain, joint pain and joint swelling. Negative for gait problem, myalgias and myalgias.  Skin:  Positive for rash. Negative for color change and sensitivity to sunlight.  Allergic/Immunologic: Negative for susceptible to infections.  Neurological:  Negative for fainting and headaches.  Hematological:  Negative for swollen glands.   Psychiatric/Behavioral:  Positive for depressed mood and sleep disturbance. The patient is nervous/anxious.     PMFS History:  Patient Active Problem List   Diagnosis Date Noted   DDD (degenerative disc disease), lumbar 04/11/2022   Lateral epicondylitis, right elbow 04/27/2021   CMC (carpometacarpal) synovitis 02/15/2021   Lateral epicondylitis of left elbow 02/15/2021   Shoulder impingement syndrome, right 02/15/2021   Preventative health care 02/02/2021   Arthralgia 02/02/2021   Thumb injury, right, initial encounter 11/08/2017   Sciatica 07/10/2014   Low back pain 05/10/2012   UNSPECIFIED MYALGIA AND MYOSITIS 04/28/2008   Anxiety and depression 09/08/2006   Depressive disorder, not elsewhere classified 09/08/2006   ALLERGIC RHINITIS 09/06/2006   INSOMNIA 09/06/2006    Past Medical History:  Diagnosis Date   Allergy    Anal fissure    Anxiety    Arthritis    Depression    External hemorrhoids     Family History  Problem Relation Age of Onset   Colitis Mother    Basal cell carcinoma Mother    Arthritis Mother    Crohn's disease Father        questionable diagnosis   Protein C deficiency Sister    Deep vein thrombosis Sister    Pulmonary embolism Sister    Other Sister        pre-diabetes   Other Sister        pre-diabetes   Ankylosing spondylitis Sister    Colitis Sister    Other Brother        pre-diabetes  Heart disease Paternal Grandmother    Colon cancer Neg Hx    Cancer Neg Hx    Drug abuse Neg Hx    Stroke Neg Hx    Past Surgical History:  Procedure Laterality Date   COLONOSCOPY N/A 12/14/2012   Procedure: COLONOSCOPY;  Surgeon: Lafayette Dragon, MD;  Location: [facility];  Service: Endoscopy;  Laterality: N/A;   ORIF FINGER / THUMB FRACTURE Right    WISDOM TOOTH EXTRACTION     Social History   Social History Narrative   Works at [employer] with fiance   No children    Immunization History  Administered Date(s) Administered    Influenza,inj,Quad PF,6+ Mos 03/27/2017, 04/03/2018, 02/26/2019, 02/02/2021   Moderna Sars-Covid-2 Vaccination 11/13/2019, 12/11/2019   Td 06/29/2009, 11/06/2017     Objective: Vital Signs: BP 135/89 (BP Location: Left Arm, Patient Position: Sitting, Cuff Size: Normal)   Pulse 74   Resp 16   Ht _0  (1.626 m)   Wt 150 lb (68 kg)   BMI 25.75 kg/m    Physical Exam Vitals and nursing note reviewed.  Constitutional:      Appearance: She is well-developed.  HENT:     Head: Normocephalic and atraumatic.  Eyes:     Conjunctiva/sclera: Conjunctivae normal.  Cardiovascular:     Rate and Rhythm: Normal rate and regular rhythm.     Heart sounds: Normal heart sounds.  Pulmonary:     Effort: Pulmonary effort is normal.     Breath sounds: Normal breath sounds.  Abdominal:     General: Bowel sounds are normal.     Palpations: Abdomen is soft.  Musculoskeletal:     Cervical back: Normal range of motion.  Lymphadenopathy:     Cervical: No cervical adenopathy.  Skin:    General: Skin is warm and dry.     Capillary Refill: Capillary refill takes less than 2 seconds.     Comments: Psoriasis patches were noted on the scalp.  Neurological:     Mental Status: She is alert and oriented to person, place, and time.  Psychiatric:        Behavior: Behavior normal.      Musculoskeletal Exam: Cervical, thoracic and lumbar spine were in good range of motion.  She had tenderness over bilateral SI joints.  Shoulder joints, elbow joints, wrist joints with good range of motion.  She had some discomfort range of motion of her right shoulder joint.  She had tenderness over bilateral lateral epicondyle region.  She had tenderness over bilateral MCPs and synovitis over left second MCP joint.  Hip joints and knee joints in good range of motion without any warmth swelling or effusion.  There was no tenderness over ankles or MTPs.  Right first MTP tenderness was noted.  There was no planter fasciitis or  Achilles tendinitis.  CDAI Exam: CDAI Score: -- Patient Global: --; Provider Global: -- Swollen: --; Tender: -- Joint Exam 05/03/2022   No joint exam has been documented for this visit   There is currently no information documented on the homunculus. Go to the Rheumatology activity and complete the homunculus joint exam.  Investigation: No additional findings.  Imaging: XR Pelvis 1-2 Views  Result Date: 04/11/2022 No SI joint narrowing or sclerosis was noted.  No hip joint narrowing was noted. Impression: Unremarkable x-rays of the SI joints.  XR Foot 2 Views Left  Result Date: 04/11/2022 No PIP and DIP narrowing was noted.  No MTP, intertarsal, tibiotalar  or subtalar joint space narrowing was noted.  No erosive changes were noted. Impression: Unremarkable x-rays of the foot.  XR Foot 2 Views Right  Result Date: 04/11/2022 No PIP and DIP narrowing was noted.  No MTP, intertarsal, tibiotalar or subtalar joint space narrowing was noted.  No erosive changes were noted. Impression: Unremarkable x-rays of the foot.  XR Shoulder Right  Result Date: 04/11/2022 No glenohumeral or acromioclavicular joint space narrowing was noted.  No chondrocalcinosis was noted. Impression: Unremarkable x-rays of the shoulder joint.  XR Hand 2 View Left  Result Date: 04/11/2022 CMC, PIP and DIP narrowing was noted.  No MCP, intercarpal or radiocarpal joint space narrowing was noted.  No erosive changes were noted. Impression: These findings are consistent with osteoarthritis of the hand.  XR Hand 2 View Right  Result Date: 04/11/2022 CMC, PIP and DIP narrowing was noted.  No MCP, intercarpal or radiocarpal joint space narrowing was noted.  No erosive changes were noted. Impression: These findings are consistent with osteoarthritis of the hand.   Recent Labs: Lab Results  Component Value Date   WBC 6.9 04/11/2022   HGB 13.4 04/11/2022   PLT 230 04/11/2022   NA 139 04/11/2022   K 4.0  04/11/2022   CL 102 04/11/2022   CO2 27 04/11/2022   GLUCOSE 81 04/11/2022   BUN 13 04/11/2022   CREATININE 0.83 04/11/2022   BILITOT 0.5 04/11/2022   ALKPHOS 40 07/10/2019   AST 19 04/11/2022   ALT 19 04/11/2022   PROT 7.4 04/11/2022   PROT 7.1 04/11/2022   ALBUMIN 4.3 07/10/2019   CALCIUM 9.7 04/11/2022   GFRAA  10/05/2008    >60        The eGFR has been calculated using the MDRD equation. This calculation has not been validated in all clinical situations. eGFR's persistently <60 mL/min signify possible Chronic Kidney Disease.   QFTBGOLDPLUS NEGATIVE 04/11/2022   April 11, 2022 SPEP normal, immunoglobulins normal, TB Gold negative, hepatitis B-, hepatitis C negative, CK normal, TSH normal, RF negative, anti-CCP negative, ANA negative, uric acid 4.2, HLA-B27 positive   Speciality Comments: No specialty comments available.  Procedures:  No procedures performed Allergies: Patient has no known allergies.   Assessment / Plan:     Visit Diagnoses: Psoriatic arthritis ([place]) - History of inflammatory arthritis for the last 1 year.  Pain in right shoulder, bilateral lateral epicondylitis, left second MCP synovitis, chronic SI joint pain.  Detailed counsel regarding psoriatic arthritis was provided.  Different treatment options and their side effects were discussed.  There is family history of ulcerative colitis and Crohn's disease in her family.  Patient was diagnosed with collagenous colitis in the past.  After discussing treatment options and their side effects patient decided to proceed with Stelara.  Will apply for Stelara.  Handout was given and consent was taken.  Counseled patient that Delsa Grana is a IL-12 & IL-23 inhibitor.  Counseled patient on purpose, proper use, and adverse effects of Stelara.  Reviewed the most common adverse effects including infection and injection site reactions. Discussed that there is the possibility of an increased risk of malignancy and rare cases  of RPLS.  Reviewed the signs and symptoms of RPLS with patient.  Counseled patient that Stelara should be held for infection and prior to scheduled surgery.  Advised patient to get annual influenza vaccine and the pneumococcal vaccine as indicated. Recommend annual influenza, PCV 15 or PCV20 or Pneumovax 23, and Shingrix as indicated.  Reviewed the importance  of regular labs while on Stelara therapy.  Will monitor CBC and CMP 1 month after starting and then every 3 months routinely thereafter. Will monitor TB gold annually. Standing orders placed.   Provided patient with medication education material and answered all questions.  Patient voiced understanding.  Patient consented to Stelara.  Will upload consent into the media tab.  Reviewed storage instructions of Stelara.  Advised initial injection must be administered in office.  Patient verbalized understanding.  Will apply for Stelara through patient's insurance and update when we receive a response.  Dose will be Stelara 45 mg on weeks 0 and 4 then  <100 kg 45 mg every 12 weeks.  Prescription pending lab results and insurance approval.   Psoriasis-she had erythematous scales on the right side of her scalp.  Patient was also was evaluated by Dr. Delman Cheadle who confirmed the diagnosis of psoriasis.  High risk medication use-patient will need labs in a month, 3 months and then every 3 months to monitor for drug toxicity.  We will do annual TB Gold.  HLA B27 positive  Shoulder impingement syndrome, right - Limited painful range of motion of the right shoulder joint and tenderness over anterior aspect of the right shoulder.  Lateral epicondylitis of both elbows - She had tenderness over bilateral lateral epicondyle region.  Forearm exercises were discussed.  Pain in both hands - Synovitis was noted over left second MCP joint.  X-rays were unremarkable.  All autoimmune labs negative.  HLA-B27 positive.  X-ray findings and lab findings were discussed with the  patient.  Pain in both feet - Tenderness over right first MTP joint.  No synovitis noted.  X-rays were unremarkable.  She denied Planter fasciitis or Achilles tendinitis today.  She has had problems with plantar fascia tenderness in the past.  Chronic SI joint pain - History of significant morning stiffness and tenderness over bilateral SI joints.  X-rays were unremarkable.  X-ray findings were discussed with the patient.  Collagenous colitis-patient had colonoscopy by Dr. Maurene Capes in the past and was diagnosed with collagenous colitis.  Patient states she was treated with Lialda in the past.  She has had 2 bouts of collagenous colitis.  DDD (degenerative disc disease), lumbar - History of DDD lumbar spine.  Patient.  Myalgia-she has generalized myalgias off-and-on.  Other fatigue-she continues to have fatigue.  Anxiety and depression  Adjustment insomnia  Family history of ankylosing spondylitis-Sister - Inflammatory arthritis in her mother.  Family history of ulcerative colitis-mother  Family history of Crohn's disease-father  Orders: Orders Placed This Encounter  Procedures   CBC with Differential/Platelet   COMPLETE METABOLIC PANEL WITH GFR   No orders of the defined types were placed in this encounter.   Follow-Up Instructions: Return in about 6 weeks (around 06/14/2022) for Psoriatic arthritis.   Bo Merino, MD  Note - This record has been created using Editor, commissioning.  Chart creation errors have been sought, but may not always  have been located. Such creation errors do not reflect on  the standard of medical care.

## 2022-04-20 NOTE — Progress Notes (Signed)
I will discuss results at the follow-up visit.

## 2022-05-03 ENCOUNTER — Telehealth: Payer: Self-pay | Admitting: Family

## 2022-05-03 ENCOUNTER — Encounter: Payer: Self-pay | Admitting: Family

## 2022-05-03 ENCOUNTER — Encounter: Payer: Self-pay | Admitting: Rheumatology

## 2022-05-03 ENCOUNTER — Ambulatory Visit: Payer: 59 | Attending: Rheumatology | Admitting: Rheumatology

## 2022-05-03 ENCOUNTER — Ambulatory Visit (INDEPENDENT_AMBULATORY_CARE_PROVIDER_SITE_OTHER): Payer: 59 | Admitting: Family

## 2022-05-03 ENCOUNTER — Telehealth: Payer: Self-pay | Admitting: Pharmacist

## 2022-05-03 VITALS — BP 135/89 | HR 74 | Resp 16 | Ht 64.0 in | Wt 150.0 lb

## 2022-05-03 VITALS — BP 121/80 | HR 83 | Temp 98.6°F | Resp 16 | Ht 64.0 in | Wt 149.0 lb

## 2022-05-03 DIAGNOSIS — M5136 Other intervertebral disc degeneration, lumbar region: Secondary | ICD-10-CM

## 2022-05-03 DIAGNOSIS — K219 Gastro-esophageal reflux disease without esophagitis: Secondary | ICD-10-CM

## 2022-05-03 DIAGNOSIS — K52831 Collagenous colitis: Secondary | ICD-10-CM

## 2022-05-03 DIAGNOSIS — L4050 Arthropathic psoriasis, unspecified: Secondary | ICD-10-CM | POA: Diagnosis not present

## 2022-05-03 DIAGNOSIS — R5383 Other fatigue: Secondary | ICD-10-CM

## 2022-05-03 DIAGNOSIS — Z79899 Other long term (current) drug therapy: Secondary | ICD-10-CM | POA: Diagnosis not present

## 2022-05-03 DIAGNOSIS — F32A Depression, unspecified: Secondary | ICD-10-CM

## 2022-05-03 DIAGNOSIS — L409 Psoriasis, unspecified: Secondary | ICD-10-CM

## 2022-05-03 DIAGNOSIS — R21 Rash and other nonspecific skin eruption: Secondary | ICD-10-CM

## 2022-05-03 DIAGNOSIS — Z1589 Genetic susceptibility to other disease: Secondary | ICD-10-CM

## 2022-05-03 DIAGNOSIS — Z1231 Encounter for screening mammogram for malignant neoplasm of breast: Secondary | ICD-10-CM | POA: Diagnosis not present

## 2022-05-03 DIAGNOSIS — Z8379 Family history of other diseases of the digestive system: Secondary | ICD-10-CM

## 2022-05-03 DIAGNOSIS — F5102 Adjustment insomnia: Secondary | ICD-10-CM

## 2022-05-03 DIAGNOSIS — M79641 Pain in right hand: Secondary | ICD-10-CM

## 2022-05-03 DIAGNOSIS — M7711 Lateral epicondylitis, right elbow: Secondary | ICD-10-CM

## 2022-05-03 DIAGNOSIS — M1990 Unspecified osteoarthritis, unspecified site: Secondary | ICD-10-CM

## 2022-05-03 DIAGNOSIS — Z8269 Family history of other diseases of the musculoskeletal system and connective tissue: Secondary | ICD-10-CM

## 2022-05-03 DIAGNOSIS — M7712 Lateral epicondylitis, left elbow: Secondary | ICD-10-CM

## 2022-05-03 DIAGNOSIS — M7541 Impingement syndrome of right shoulder: Secondary | ICD-10-CM

## 2022-05-03 DIAGNOSIS — M79642 Pain in left hand: Secondary | ICD-10-CM

## 2022-05-03 DIAGNOSIS — M533 Sacrococcygeal disorders, not elsewhere classified: Secondary | ICD-10-CM

## 2022-05-03 DIAGNOSIS — M79671 Pain in right foot: Secondary | ICD-10-CM

## 2022-05-03 DIAGNOSIS — F419 Anxiety disorder, unspecified: Secondary | ICD-10-CM | POA: Diagnosis not present

## 2022-05-03 DIAGNOSIS — M79672 Pain in left foot: Secondary | ICD-10-CM

## 2022-05-03 DIAGNOSIS — Z0000 Encounter for general adult medical examination without abnormal findings: Secondary | ICD-10-CM | POA: Diagnosis not present

## 2022-05-03 DIAGNOSIS — G8929 Other chronic pain: Secondary | ICD-10-CM

## 2022-05-03 DIAGNOSIS — M791 Myalgia, unspecified site: Secondary | ICD-10-CM

## 2022-05-03 DIAGNOSIS — M51369 Other intervertebral disc degeneration, lumbar region without mention of lumbar back pain or lower extremity pain: Secondary | ICD-10-CM

## 2022-05-03 MED ORDER — SERTRALINE HCL 100 MG PO TABS: 100.0000 mg | ORAL_TABLET | Freq: Two times a day (BID) | ORAL | 0 refills | Status: DC

## 2022-05-03 MED ORDER — TRAZODONE HCL 100 MG PO TABS: ORAL_TABLET | ORAL | 1 refills | Status: DC

## 2022-05-03 NOTE — Telephone Encounter (Signed)
Submitted a Prior Authorization request to [facility] for [facility] via CoverMyMeds. Will update once we receive a response.  Key: [id number]

## 2022-05-03 NOTE — Progress Notes (Signed)
Subjective:   By signing my name below, I, Shehryar Baig, attest that this documentation has been prepared under the direction and in the presence of [name], NP. 05/03/2022   Patient ID: [name] [name], female    DOB: 1975-11-02, 47 y.o.   MRN: [id number]  Chief Complaint  Patient presents with   Annual Exam    HPI Patient is in today for a comprehensive physical exam.   Teeth pain: She reports having occasional teeth pain and thinks it is due to her recent sinus issues. She grinds her teeth at night and wears a night guard but has not worn it regularly recently.   Mood: She continues taking 100 mg Zoloft daily PO and 100 mg trazodone daily PO and reports no new issues while taking them.   Zyrtec: She continues taking zyrtec regularly. She notes feeling itchy when she stops taking it, so she takes it all year.   Reflux: She is taking 20 mg Omeprazole daily PO at this time and is planning on taking it less regularly when her diet normalizes.   Acute: She denies having any unexpected weight change, ear pain, hearing loss and rhinorrhea, visual disturbance, cough, chest pain and leg swelling, nausea, vomiting, diarrhea, constipation, blood in stool, or dysuria and frequency, for myalgias and arthralgias, rash, headaches, adenopathy, current concerns of depression or anxiety at this time.  Social history: She has no new surgeries to report. She has no changes to her family medical history. She has 3 drinks a week. She does not use any drugs. She does not use any tobacco products. She uses condoms for birth control.   Immunizations: She does not remember if she received a flu vaccine this year. She does not have the latest Covid-19 booster vaccine and is not interested in receiving it.   Diet: She is managing a healthy diet.   Exercise: She is not participating in regular exercise.   Colonoscopy: Last completed 12/14/2012. Due.   Pap Smear: She reports her last pap smear  was June 2023 at her GYN's office.    Dental: She is UTD on dental care.  Vision: She is UTD on vision care.    Past Medical History:  Diagnosis Date   Allergy    Anal fissure    Anxiety    Arthritis    Depression    External hemorrhoids     Past Surgical History:  Procedure Laterality Date   COLONOSCOPY N/A 12/14/2012   Procedure: COLONOSCOPY;  Surgeon: Lafayette Dragon, MD;  Location: [facility];  Service: Endoscopy;  Laterality: N/A;   ORIF FINGER / THUMB FRACTURE Right    WISDOM TOOTH EXTRACTION      Family History  Problem Relation Age of Onset   Colitis Mother    Basal cell carcinoma Mother    Arthritis Mother    Crohn's disease Father        questionable diagnosis   Protein C deficiency Sister    Deep vein thrombosis Sister    Pulmonary embolism Sister    Other Sister        pre-diabetes   Other Sister        pre-diabetes   Ankylosing spondylitis Sister    Colitis Sister    Other Brother        pre-diabetes   Heart disease Paternal Grandmother    Colon cancer Neg Hx    Cancer Neg Hx    Drug abuse Neg Hx    Stroke Neg  Hx     Social History   Socioeconomic History   Marital status: Single    Spouse name: Not on file   Number of children: 0   Years of education: Not on file   Highest education level: Not on file  Occupational History    Employer: [employer]  Tobacco Use   Smoking status: Former    Types: Cigarettes    Quit date: 05/02/2001    Years since quitting: 21.0    Passive exposure: Current   Smokeless tobacco: Never  Vaping Use   Vaping Use: Never used  Substance and Sexual Activity   Alcohol use: Yes    Comment: 3 drinks a week   Drug use: No   Sexual activity: Yes    Partners: Male    Birth control/protection: Condom  Other Topics Concern   Not on file  Social History Narrative   Works at [employer] with fiance   No children    Social Determinants of Corporate investment banker Strain: Not on file  Food  Insecurity: Not on file  Transportation Needs: Not on file  Physical Activity: Not on file  Stress: Not on file  Social Connections: Not on file  Intimate Partner Violence: Not on file    Outpatient Medications Prior to Visit  Medication Sig Dispense Refill   Ascorbic Acid (VITAMIN C PO) Take by mouth daily.     ascorbic acid (VITAMIN C) 500 MG tablet Take 500 mg by mouth daily.     cetirizine (ZYRTEC) 10 MG tablet Take 10 mg by mouth daily.       omeprazole (PRILOSEC) 20 MG capsule Take 20 mg by mouth daily as needed.     OVER THE COUNTER MEDICATION daily. Provitalize     Probiotic Product (ALIGN) 4 MG CAPS Take 1 capsule by mouth as needed.     sertraline (ZOLOFT) 100 MG tablet Take 1 tablet (100 mg total) by mouth 2 (two) times daily. 180 tablet 0   traZODone (DESYREL) 100 MG tablet TAKE 1 TABLET BY MOUTH EVERYDAY AT BEDTIME 90 tablet 0   No facility-administered medications prior to visit.    No Known Allergies  Review of Systems  Constitutional:  Negative for fever.       (-)unexpected weight change (-)Adenopathy  HENT:  Negative for congestion, sinus pain and sore throat.   Eyes:        (-)Visual disturbance  Respiratory:  Negative for cough, shortness of breath and wheezing.   Cardiovascular:  Negative for chest pain, palpitations and leg swelling.  Gastrointestinal:  Negative for blood in stool, constipation, diarrhea, nausea and vomiting.  Genitourinary:  Negative for dysuria, frequency and hematuria.  Musculoskeletal:        (-)new muscle pain (-)new joint pain  Skin:        (-)new moles  Neurological:  Negative for dizziness and headaches.  Psychiatric/Behavioral:  Negative for depression. The patient is not nervous/anxious.        Objective:    Physical Exam Constitutional:      General: She is not in acute distress.    Appearance: Normal appearance. She is not ill-appearing.  HENT:     Head: Normocephalic and atraumatic.     Right Ear: Tympanic  membrane, ear canal and external ear normal.     Left Ear: Tympanic membrane, ear canal and external ear normal.  Eyes:     Extraocular Movements: Extraocular movements intact.  Right eye: No nystagmus.     Left eye: No nystagmus.     Pupils: Pupils are equal, round, and reactive to light.  Neck:     Thyroid: No thyroid tenderness.  Cardiovascular:     Rate and Rhythm: Normal rate and regular rhythm.     Heart sounds: Normal heart sounds. No murmur heard.    No gallop.  Pulmonary:     Effort: Pulmonary effort is normal. No respiratory distress.     Breath sounds: Normal breath sounds. No wheezing or rales.  Chest:  Breasts:    Right: Normal.     Left: Normal.  Abdominal:     General: There is no distension.     Palpations: Abdomen is soft.     Tenderness: There is no abdominal tenderness. There is no guarding.  Musculoskeletal:     Comments: 5/5 strength in both upper and lower extremities  Lymphadenopathy:     Cervical: No cervical adenopathy.  Skin:    General: Skin is warm and dry.  Neurological:     Mental Status: She is alert and oriented to person, place, and time.     Deep Tendon Reflexes:     Reflex Scores:      Patellar reflexes are 2+ on the right side and 2+ on the left side. Psychiatric:        Judgment: Judgment normal.     BP 121/80 (BP Location: Right Arm, Patient Position: Sitting, Cuff Size: Small)   Pulse 83   Temp 98.6 F (37 C) (Oral)   Resp 16   Ht 5\' 4"  (1.626 m)   Wt 149 lb (67.6 kg)   SpO2 99%   BMI 25.58 kg/m  Wt Readings from Last 3 Encounters:  05/03/22 149 lb (67.6 kg)  05/03/22 150 lb (68 kg)  04/11/22 145 lb 9.6 oz (66 kg)       Assessment & Plan:  Preventative health care Assessment & Plan: Continue healthy diet. Encouraged her to increase her exercise. Declines cologuard/colonoscopy at this time but will let me know if she changes her mind. Mammo due. Reports pap up to date. Will request pap from her GYN. Declines covid  vaccine. Will confirm if she had flu shot at her work and get back to [place].    Anxiety and depression Assessment & Plan: Stable on zoloft.  Continue same. Continue trazodone for sleep.    Gastroesophageal reflux disease, unspecified whether esophagitis present Assessment & Plan: Stable with prn use of omeprazole.  Continue same.    Breast cancer screening by mammogram -     3D Screening Mammogram, Left and Right; Future  Other orders -     traZODone HCl; TAKE 1 TABLET BY MOUTH EVERYDAY AT BEDTIME  Dispense: 90 tablet; Refill: 1 -     Sertraline HCl; Take 1 tablet (100 mg total) by mouth 2 (two) times daily.  Dispense: 180 tablet; Refill: 0    I, Nance Pear, NP, personally preformed the services described in this documentation.  All medical record entries made by the scribe were at my direction and in my presence.  I have reviewed the chart and discharge instructions (if applicable) and agree that the record reflects my personal performance and is accurate and complete. 05/03/2022   I,Shehryar Baig,acting as a scribe for Nance Pear, NP.,have documented all relevant documentation on the behalf of Nance Pear, NP,as directed by  Nance Pear, NP while in the presence of Nance Pear,  NP.   Nance Pear, NP

## 2022-05-03 NOTE — Assessment & Plan Note (Signed)
Stable with prn use of omeprazole. Continue same.  

## 2022-05-03 NOTE — Telephone Encounter (Signed)
Please start STELARA SQ BIV pending OV note from today  Dose: 45 mg SQ at Week 0, Week 4, then every 12 weeks (run PA for 45mg  per 12 weeks as we will likely use sample for first dose in clinic)  Dx: Psoriatic arthritis (L40.5) and Psoriasis (L40.9), family history of UC and CD  Previously tried therapies: Lialda - inadequate response  Therapies patient unable to try: MTX and leflunomide - lack of hormonal contraception  [name] Saliva, PharmD, MPH, BCPS, CPP Clinical Pharmacist (Rheumatology and Pulmonology)

## 2022-05-03 NOTE — Assessment & Plan Note (Signed)
Continue healthy diet. Encouraged her to increase her exercise. Declines cologuard/colonoscopy at this time but will let me know if she changes her mind. Mammo due. Reports pap up to date. Will request pap from her GYN. Declines covid vaccine. Will confirm if she had flu shot at her work and get back to [place].

## 2022-05-03 NOTE — Telephone Encounter (Signed)
Records release faxed 

## 2022-05-03 NOTE — Telephone Encounter (Signed)
Please request copy of pap from Dr. Cletis Media.

## 2022-05-03 NOTE — Patient Instructions (Addendum)
Vaccines You are taking a medication(s) that can suppress your immune system.  The following immunizations are recommended: Flu annually Covid-19  Td/Tdap (tetanus, diphtheria, pertussis) every 10 years Pneumonia (Prevnar 15 then Pneumovax 23 at least 1 year apart.  Alternatively, can take Prevnar 20 without needing additional dose) Shingrix: 2 doses from 4 weeks to 6 months apart  Please check with your PCP to make sure you are up to date.  If you have signs or symptoms of an infection or start antibiotics: First, call your PCP for workup of your infection. Hold your medication through the infection, until you complete your antibiotics, and until symptoms resolve if you take the following: Injectable medication (Actemra, Benlysta, Cimzia, Cosentyx, Enbrel, Humira, Kevzara, Orencia, Remicade, Simponi, Stelara, Taltz, Tremfya) Methotrexate Leflunomide (Arava) Mycophenolate (Cellcept) Morrie Sheldon, Olumiant, or Rinvoq  Standing Labs We placed an order today for your standing lab work.   Please have your standing labs drawn in 1 month and then every 3 months.  Please have your labs drawn 2 weeks prior to your appointment so that the provider can discuss your lab results at your appointment.  Please note that you may see your imaging and lab results in [place] before we have reviewed them. We will contact you once all results are reviewed. Please allow our office up to 72 hours to thoroughly review all of the results before contacting the office for clarification of your results.  Lab hours are:   Monday through Thursday from 8:00 am -12:30 pm and 1:00 pm-5:00 pm and Friday from 8:00 am-12:00 pm.  Please be advised, all patients with office appointments requiring lab work will take precedent over walk-in lab work.   Labs are drawn by [facility]. Please bring your co-pay at the time of your lab draw.  You may receive a bill from [facility] for your lab work.  Please note if you are on  Hydroxychloroquine and and an order has been placed for a Hydroxychloroquine level, you will need to have it drawn 4 hours or more after your last dose.  If you wish to have your labs drawn at another location, please call the office 24 hours in advance so we can fax the orders.  The office is located at [address] [place], [place], [place] [address] No appointment is necessary.    If you have any questions regarding directions or hours of operation,  please call [phone].   As a reminder, please drink plenty of water prior to coming for your lab work. Thanks!   Ustekinumab Injection What is this medication? USTEKINUMAB ([place] te KIN ue mab) treats autoimmune conditions, such as psoriasis, arthritis, Crohn's disease, and ulcerative colitis. It works by slowing down an overactive immune system. It is a monoclonal antibody. This medicine may be used for other purposes; ask your health care provider or pharmacist if you have questions. COMMON BRAND NAME(S): Stelara What should I tell my care team before I take this medication? They need to know if you have any of these conditions: Cancer Diabetes History of skin cancer Immune system problems Infection, such as a bacterial or viral infection, chickenpox, cold sores, herpes, or history of infections New or changing lesions on your skin Receiving or have received allergy shots Receive or have received phototherapy for the skin Recently received or scheduled to receive a vaccine Tuberculosis, a positive skin test for tuberculosis, or recent close contact with someone who has tuberculosis An unusual or allergic reaction to ustekinumab, latex, other medications, foods,  dyes, or preservatives Pregnant or trying to get pregnant Breast-feeding How should I use this medication? This medication is injected under the skin or into a vein. It is usually given by your care team in a hospital or clinic setting. It may also be given at home. If  you get this medication at home, you will be taught how to prepare and give it. Use it exactly as directed. Take it as directed on the prescription label. Keep taking it unless your care team tells you to stop. It is important that you put your used needles and syringes in a special sharps container. Do not put them in a trash can. If you do not have a sharps container, call your pharmacist or care team to get one. A special MedGuide will be given to you by the pharmacist with each prescription and refill. Be sure to read this information carefully each time. Talk to your care team about the use of this medication in children. While it may be prescribed for children as young as 6 years for selected conditions, precautions do apply. Overdosage: If you think you have taken too much of this medicine contact a poison control center or emergency room at once. NOTE: This medicine is only for you. Do not share this medicine with others. What if I miss a dose? If you get this medication at the hospital or clinic: It is important not to miss your dose. Call your care team if you are unable to keep an appointment. If you give yourself this medication at home: If you miss a dose, take it as soon as you can. Then continue your normal schedule. Do not take double or extra doses. Call your care team with questions. What may interact with this medication? Do not take this medication with any of the following: Live virus vaccines This medication may also interact with the following: Biologic medications, such as abatacept, adalimumab, anakinra, certolizumab, etanercept, golimumab, infliximab, rituximab, secukinumab, tocilizumab Cyclosporine Warfarin This list may not describe all possible interactions. Give your health care provider a list of all the medicines, herbs, non-prescription drugs, or dietary supplements you use. Also tell them if you smoke, drink alcohol, or use illegal drugs. Some items may interact with  your medicine. What should I watch for while using this medication? Visit your care team for regular checks on your progress. Your condition will be monitored carefully while you are receiving this medication. Tell your care team if your symptoms do not start to get better or if they get worse. You will be tested for tuberculosis (TB) before you start this medication. If your care team prescribes any medication for TB, you should start taking the TB medication before starting this medication. Make sure to finish the full course of TB medication. This medication may increase your risk of getting an infection. Call your care team for advice if you get fever, chills, sore throat, or other symptoms of a cold or flu. Do not treat yourself. Try to avoid being around people who are sick. Talk to your care team about your risk of cancer. You may be more at risk for certain types of cancers if you take this medication. Certain genetic factors may decrease the safety of this medication. Your care team may use genetic tests to determine treatment. What side effects may I notice from receiving this medication? Side effects that you should report to your care team as soon as possible: Allergic reactions--skin rash, itching, hives, swelling of the  face, lips, tongue, or throat Change in your skin, such as a new growth, a sore that doesn't heal, or a change in a mole Dry cough, shortness of breath or trouble breathing Infection--fever, chills, cough, sore throat, wounds that don't heal, pain or trouble when passing urine, general feeling of discomfort or being unwell Sudden and severe headache, confusion, change in vision, seizures, which may be signs of posterior reversible encephalopathy syndrome (PRES) Side effects that usually do not require medical attention (report to your care team if they continue or are bothersome): Diarrhea Dizziness Fatigue Headache Pain, redness, or irritation at injection  site Runny or stuffy nose Sore throat This list may not describe all possible side effects. Call your doctor for medical advice about side effects. You may report side effects to FDA at 1-800-FDA-1088. Where should I keep my medication? Keep out of the reach of children and pets. Store in a refrigerator or at room temperature between 20 and 25 degrees C (68 and 77 degrees F). Refrigeration (preferred): Store the prefilled syringes or vials in the refrigerator. Keep this medication in the original carton. Protect from light. Do not freeze. Do not shake. The vials should be stored upright. Get rid of any unused medication after the expiration date. Room temperature: A prefilled syringe may be stored at room temperature for up to 30 days. Keep this medication in the original carton. Protect from light. Record the date when it is removed from the refrigerator on the carton. Once a syringe has been stored at room temperature, do not put it back in the refrigerator. Get rid of the syringe away after 30 days at room temperature or the expiration date, whichever comes first. To get rid of medications that are no longer needed or have expired: Take the medication to a medication take-back program. Check with your pharmacy or law enforcement to find a location. If you cannot return the medication, ask your pharmacist or care team how to get rid of the medication safely. NOTE: This sheet is a summary. It may not cover all possible information. If you have questions about this medicine, talk to your doctor, pharmacist, or health care provider.  2023 Elsevier/Gold Standard (2021-06-30 00:00:00)

## 2022-05-03 NOTE — Assessment & Plan Note (Signed)
Stable on zoloft.  Continue same. Continue trazodone for sleep.

## 2022-05-03 NOTE — Progress Notes (Signed)
Pharmacy Note  Subjective: Patient presents today to [facility] for follow up office visit.  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  Prior therapy includes:Lialda,   She states she is perimenopausal (?). Her and her husband are only using condoms for contraception. She is not a good candidate for MTX or leflunomide  She has PMH of collagenous colitis and is at risk for Crohns' disease and ulcerative colitis due to family history.  Objective:  CBC    Component Value Date/Time   WBC 6.9 04/11/2022 0944   RBC 4.63 04/11/2022 0944   HGB 13.4 04/11/2022 0944   HCT 40.7 04/11/2022 0944   PLT 230 04/11/2022 0944   MCV 87.9 04/11/2022 0944   MCV 91.3 01/15/2012 1337   MCH 28.9 04/11/2022 0944   MCHC 32.9 04/11/2022 0944   RDW 12.1 04/11/2022 0944   LYMPHSABS 1,484 04/11/2022 0944   MONOABS 0.6 07/10/2019 1352   EOSABS 338 04/11/2022 0944   BASOSABS 48 04/11/2022 0944     CMP     Component Value Date/Time   NA 139 04/11/2022 0944   K 4.0 04/11/2022 0944   CL 102 04/11/2022 0944   CO2 27 04/11/2022 0944   GLUCOSE 81 04/11/2022 0944   GLUCOSE 94 05/05/2006 1333   BUN 13 04/11/2022 0944   CREATININE 0.83 04/11/2022 0944   CALCIUM 9.7 04/11/2022 0944   PROT 7.4 04/11/2022 0944   PROT 7.1 04/11/2022 0944   ALBUMIN 4.3 07/10/2019 1352   AST 19 04/11/2022 0944   ALT 19 04/11/2022 0944   ALKPHOS 40 07/10/2019 1352   BILITOT 0.5 04/11/2022 0944   GFRNONAA >60 10/05/2008 2240   GFRAA  10/05/2008 2240    >60        The eGFR has been calculated using the MDRD equation. This calculation has not been validated in all clinical situations. eGFR's persistently <60 mL/min signify possible Chronic Kidney Disease.     Baseline Immunosuppressant Therapy Labs TB GOLD    Latest Ref Rng & Units 04/11/2022    9:44 AM  Quantiferon TB Gold  Quantiferon TB Gold Plus NEGATIVE NEGATIVE    Hepatitis Panel     Latest Ref Rng & Units 04/11/2022    9:44 AM  Hepatitis  Hep B Surface Ag NON-REACTIVE NON-REACTIVE   Hep B IgM NON-REACTIVE NON-REACTIVE   Hep C Ab NON-REACTIVE NON-REACTIVE    HIV No results found for: "HIV" Immunoglobulins    Latest Ref Rng & Units 04/11/2022    9:44 AM  Immunoglobulin Electrophoresis  IgA  47 - 310 mg/dL 85   IgG 600 - 1,640 mg/dL 774   IgM 50 - 300 mg/dL 102    SPEP    Latest Ref Rng & Units 04/11/2022    9:44 AM  Serum Protein Electrophoresis  Total Protein 6.1 - 8.1 g/dL 6.1 - 8.1 g/dL 7.4    7.1   Albumin 3.8 - 4.8 g/dL 4.9   Alpha-1 0.2 - 0.3 g/dL 0.3   Alpha-2 0.5 - 0.9 g/dL 0.6   Beta Globulin 0.4 - 0.6 g/dL 0.4   Beta 2 0.2 - 0.5 g/dL 0.2   Gamma Globulin 0.8 - 1.7 g/dL 0.7    Assessment/Plan:  Counseled patient that Stelara is a IL-12 & IL-23 inhibitor.  Counseled patient on purpose, proper use, and adverse effects of Stelara.  Reviewed the most common adverse effects including infection and injection site reactions. Discussed that there is  the possibility of an increased risk of malignancy and rare cases of RPLS.  Reviewed the signs and symptoms of RPLS with patient.  Counseled patient that Stelara should be held for infection and prior to scheduled surgery.  Advised patient to get annual influenza vaccine and the pneumococcal vaccine as indicated. Recommend annual influenza, PCV 15 or PCV20 or Pneumovax 23, and Shingrix as indicated.  Reviewed the importance of regular labs while on Stelara therapy.  Will monitor CBC and CMP 1 month after starting and then every 3 months routinely thereafter. Will monitor TB gold annually. Standing orders placed.   Provided patient with medication education material and answered all questions.  Patient voiced understanding.  Patient consented to Stelara.  Will upload consent into the media tab.  Reviewed storage instructions of Stelara.  Advised initial injection must be administered in office.  Patient verbalized  understanding.  Will apply for Stelara through patient's insurance and update when we receive a response.  Her and her husband are only using condoms for contraception. She is not a good candidate for MTX or leflunomide. She also has family history of UC and herself a history of collagenous colitis. Stelara is a good option  Dose will be Stelara 45 mg on weeks 0 and 4 then  <100 kg 45 mg every 12 weeks.  Prescription pending insurance approval.   [name] Saliva, PharmD, MPH, BCPS, CPP Clinical Pharmacist (Rheumatology and Pulmonology)

## 2022-05-04 ENCOUNTER — Other Ambulatory Visit (HOSPITAL_COMMUNITY): Payer: Self-pay

## 2022-05-04 NOTE — Telephone Encounter (Signed)
Received notification from [facility] regarding a prior authorization for [facility]. Authorization has been APPROVED from 05/04/2022 to 05/05/2023. Approval letter sent to scan center.  Per test claim, it appears that the initial 28-day supply loading dose IS covered (citing an expiration date of 06/01/22) however pt is locked in to fill with [facility]  Patient must fill through [facility]: [phone] (or [phone])  Authorization # [id number]    Pt enrolled into the copay card program and has been notified via MyChart message.

## 2022-05-06 NOTE — Telephone Encounter (Signed)
Called patient to schedule Stelara new start visit. Unable to reach. Left VM requesting return call  [name] Saliva, PharmD, MPH, BCPS, CPP Clinical Pharmacist (Rheumatology and Pulmonology)

## 2022-05-10 ENCOUNTER — Inpatient Hospital Stay (HOSPITAL_BASED_OUTPATIENT_CLINIC_OR_DEPARTMENT_OTHER): Admission: RE | Admit: 2022-05-10 | Payer: 59 | Source: Ambulatory Visit

## 2022-05-10 NOTE — Telephone Encounter (Signed)
Patient scheduled for Stelara new start on 05/18/2022. Will be accompanied by fiance who will be administering.  She states she will check her MyChart regarding Stelara copay card and check her email again (she believes she deleted email that she received from [place] last week)  [name] Saliva, PharmD, MPH, BCPS, CPP Clinical Pharmacist (Rheumatology and Pulmonology)

## 2022-05-17 NOTE — Progress Notes (Signed)
Pharmacy Note  Subjective:   Patient presents with fiance to clinic today to receive first dose of Stelara. Patient currently takes no other medications for psoriatic arthritis.   Patient running a fever or have signs/symptoms of infection? No  Patient currently on antibiotics for the treatment of infection? No  Patient have any upcoming invasive procedures/surgeries? No  Objective: CMP     Component Value Date/Time   NA 139 04/11/2022 0944   K 4.0 04/11/2022 0944   CL 102 04/11/2022 0944   CO2 27 04/11/2022 0944   GLUCOSE 81 04/11/2022 0944   GLUCOSE 94 05/05/2006 1333   BUN 13 04/11/2022 0944   CREATININE 0.83 04/11/2022 0944   CALCIUM 9.7 04/11/2022 0944   PROT 7.4 04/11/2022 0944   PROT 7.1 04/11/2022 0944   ALBUMIN 4.3 07/10/2019 1352   AST 19 04/11/2022 0944   ALT 19 04/11/2022 0944   ALKPHOS 40 07/10/2019 1352   BILITOT 0.5 04/11/2022 0944   GFRNONAA >60 10/05/2008 2240   GFRAA  10/05/2008 2240    >60        The eGFR has been calculated using the MDRD equation. This calculation has not been validated in all clinical situations. eGFR's persistently <60 mL/min signify possible Chronic Kidney Disease.    CBC    Component Value Date/Time   WBC 6.9 04/11/2022 0944   RBC 4.63 04/11/2022 0944   HGB 13.4 04/11/2022 0944   HCT 40.7 04/11/2022 0944   PLT 230 04/11/2022 0944   MCV 87.9 04/11/2022 0944   MCV 91.3 01/15/2012 1337   MCH 28.9 04/11/2022 0944   MCHC 32.9 04/11/2022 0944   RDW 12.1 04/11/2022 0944   LYMPHSABS 1,484 04/11/2022 0944   MONOABS 0.6 07/10/2019 1352   EOSABS 338 04/11/2022 0944   BASOSABS 48 04/11/2022 0944    Baseline Immunosuppressant Therapy Labs TB GOLD    Latest Ref Rng & Units 04/11/2022    9:44 AM  Quantiferon TB Gold  Quantiferon TB Gold Plus NEGATIVE NEGATIVE    Hepatitis Panel    Latest Ref Rng & Units 04/11/2022    9:44 AM  Hepatitis  Hep B Surface Ag NON-REACTIVE NON-REACTIVE   Hep B IgM NON-REACTIVE  NON-REACTIVE   Hep C Ab NON-REACTIVE NON-REACTIVE    HIV No results found for: "HIV" Immunoglobulins    Latest Ref Rng & Units 04/11/2022    9:44 AM  Immunoglobulin Electrophoresis  IgA  47 - 310 mg/dL 85   IgG 600 - 1,640 mg/dL 774   IgM 50 - 300 mg/dL 102    SPEP    Latest Ref Rng & Units 04/11/2022    9:44 AM  Serum Protein Electrophoresis  Total Protein 6.1 - 8.1 g/dL 6.1 - 8.1 g/dL 7.4    7.1   Albumin 3.8 - 4.8 g/dL 4.9   Alpha-1 0.2 - 0.3 g/dL 0.3   Alpha-2 0.5 - 0.9 g/dL 0.6   Beta Globulin 0.4 - 0.6 g/dL 0.4   Beta 2 0.2 - 0.5 g/dL 0.2   Gamma Globulin 0.8 - 1.7 g/dL 0.7     Assessment/Plan:  Reviewed importance of holding Stelara with signs/symptoms of an infections, if antibiotics are prescribed to treat an active infection, and with invasive procedures   Patient able to demonstrate proper injection technique using the teach back method. Fiance injected in the right lower abdomen with:  Sample Medication: Stelara 45 mg NDC: 32951-884-16 Lot: [id number] Expiration: 06/02/2023  Patient tolerated well.  Observed for 30 mins in  office for adverse reaction and none reported.   Patient is to return in 1 month for labs and 6-8 weeks for follow-up appointment.  Standing orders placed.   Stelara approved through insurance .   [facility]: [phone].  Patient provided with pharmacy phone number and advised to call later this week to schedule shipment to home.  Patient will continue Stelara 45 mg on week 4 and then every 12 weeks thereafter.   All questions encouraged and answered.  Instructed patient to call with any further questions or concerns.  Maryan Puls, PharmD PGY-1 [facility] Pharm[facility] Resident

## 2022-05-18 ENCOUNTER — Ambulatory Visit: Payer: 59 | Attending: Physician Assistant | Admitting: Pharmacist

## 2022-05-18 DIAGNOSIS — Z7189 Other specified counseling: Secondary | ICD-10-CM

## 2022-05-18 DIAGNOSIS — L409 Psoriasis, unspecified: Secondary | ICD-10-CM

## 2022-05-18 DIAGNOSIS — L4050 Arthropathic psoriasis, unspecified: Secondary | ICD-10-CM

## 2022-05-18 MED ORDER — STELARA 45 MG/0.5ML ~~LOC~~ SOSY: PREFILLED_SYRINGE | SUBCUTANEOUS | 1 refills | Status: DC

## 2022-05-18 NOTE — Telephone Encounter (Signed)
Patient brought Stelara copay card information to [facility] ID: [id number] [place]: [id number] Group: [id number]  [name] Saliva, PharmD, MPH, BCPS, CPP Clinical Pharmacist (Rheumatology and Pulmonology)

## 2022-05-18 NOTE — Patient Instructions (Signed)
Your next STELARA dose is due on 2/14, 5/8, and every 12 weeks thereafter  [place] if you have signs or symptoms of an infection. You can resume once you feel better or back to your baseline. HOLD STELARA if you start antibiotics to treat an infection. HOLD STELARA around the time of surgery/procedures. Your surgeon will be able to provide recommendations on when to hold BEFORE and when you are cleared to [place].  Pharmacy information: Your prescription will [facility] shipped from [facility]. Their phone number is [phone] Please call to schedule shipment and confirm address. They will mail your medication to your home.  Cost information: Your copay should be affordable. If you call the pharmacy and it is not affordable, please double-check that they are billing through your copay card as secondary coverage. That copay card information is: ID: [id number] [place]: [place] Group: [id number]  Labs are due in 1 month then every 3 months. Lab hours are from Monday to Thursday 8am-12:30pm and 1pm-5pm and Friday 8am-12pm. You do not need an appointment if you come for labs during these times.  How to manage an injection site reaction: Remember the 5 C's: COUNTER - leave on the counter at least 30 minutes but up to overnight to bring medication to room temperature. This may help prevent stinging COLD - place something cold (like an ice gel pack or cold water bottle) on the injection site just before cleansing with alcohol. This may help reduce pain CLARITIN - use Claritin (generic name is loratadine) for the first two weeks of treatment or the day of, the day before, and the day after injecting. This will help to minimize injection site reactions CORTISONE CREAM - apply if injection site is irritated and itching CALL ME - if injection site reaction is bigger than the size of your fist, looks infected, blisters, or if you develop hives

## 2022-05-23 ENCOUNTER — Encounter (HOSPITAL_BASED_OUTPATIENT_CLINIC_OR_DEPARTMENT_OTHER): Payer: Self-pay

## 2022-05-23 ENCOUNTER — Ambulatory Visit (HOSPITAL_BASED_OUTPATIENT_CLINIC_OR_DEPARTMENT_OTHER)
Admission: RE | Admit: 2022-05-23 | Discharge: 2022-05-23 | Disposition: A | Discharge status: Home / Self Care | Payer: 59 | Source: Ambulatory Visit | Attending: Family | Admitting: Family

## 2022-05-23 DIAGNOSIS — Z1231 Encounter for screening mammogram for malignant neoplasm of breast: Secondary | ICD-10-CM | POA: Insufficient documentation

## 2022-05-25 ENCOUNTER — Other Ambulatory Visit: Payer: Self-pay | Admitting: Family

## 2022-05-25 DIAGNOSIS — R928 Other abnormal and inconclusive findings on diagnostic imaging of breast: Secondary | ICD-10-CM

## 2022-06-13 ENCOUNTER — Ambulatory Visit: Payer: 59

## 2022-06-13 ENCOUNTER — Ambulatory Visit
Admission: RE | Admit: 2022-06-13 | Discharge: 2022-06-13 | Disposition: A | Discharge status: Home / Self Care | Payer: 59 | Source: Ambulatory Visit | Attending: Family | Admitting: Family

## 2022-06-13 ENCOUNTER — Ambulatory Visit: Admission: RE | Admit: 2022-06-13 | Payer: 59 | Source: Ambulatory Visit

## 2022-06-13 DIAGNOSIS — R928 Other abnormal and inconclusive findings on diagnostic imaging of breast: Secondary | ICD-10-CM

## 2022-06-14 ENCOUNTER — Ambulatory Visit: Payer: 59 | Admitting: Physician Assistant

## 2022-06-14 NOTE — Progress Notes (Signed)
Office Visit Note  Patient: [name] [name]             Date of Birth: 1976-03-11           MRN: [id number]             PCP: [name], NP Referring: [name], NP Visit Date: 06/28/2022 Occupation: '@GUAROCC'$ @  Subjective:  Medication monitoring   History of Present Illness: [name] [name] is a 47 y.o. female with history of psoriatic arthritis.  Patient is currently on stelara 45 mg sq injections every 12 weeks. Patient was started on stelara on 05/18/22.  She denies any side effects or injection site reactions.  She has had a total of 2 doses and is due for her third dose in May 2024.  She denies any recent or recurrent infections since initiating stelara.  She states she has noticed improvement in pain and stiffness in both elbows and hands since starting on stelara.  She states she has had less nocturnal pain in her elbows and less sensitivity in her hands.  She continues to have psoriasis on her scalp and discomfort in her lower back.  She denies any achilles tendonitis or plantar fasciitis currently.      Activities of Daily Living:  Patient reports morning stiffness for 5 minutes.   Patient Reports nocturnal pain.  Difficulty dressing/grooming: Denies Difficulty climbing stairs: Denies Difficulty getting out of chair: Denies Difficulty using hands for taps, buttons, cutlery, and/or writing: Reports  Review of Systems  Constitutional:  Negative for fatigue.  HENT:  Negative for mouth sores and mouth dryness.   Eyes:  Negative for dryness.  Respiratory:  Negative for shortness of breath.   Cardiovascular:  Negative for chest pain and palpitations.  Gastrointestinal:  Negative for blood in stool, constipation and diarrhea.  Endocrine: Negative for increased urination.  Genitourinary:  Negative for involuntary urination.  Musculoskeletal:  Positive for joint pain, joint pain, joint swelling and morning stiffness. Negative for gait problem, myalgias,  muscle weakness, muscle tenderness and myalgias.  Skin:  Negative for color change, rash, hair loss and sensitivity to sunlight.  Allergic/Immunologic: Negative for susceptible to infections.  Neurological:  Negative for dizziness and headaches.  Hematological:  Negative for swollen glands.  Psychiatric/Behavioral:  Positive for sleep disturbance. Negative for depressed mood. The patient is not nervous/anxious.     PMFS History:  Patient Active Problem List   Diagnosis Date Noted   GERD (gastroesophageal reflux disease) 05/03/2022   DDD (degenerative disc disease), lumbar 04/11/2022   Lateral epicondylitis, right elbow 04/27/2021   CMC (carpometacarpal) synovitis 02/15/2021   Lateral epicondylitis of left elbow 02/15/2021   Shoulder impingement syndrome, right 02/15/2021   Preventative health care 02/02/2021   Arthralgia 02/02/2021   Thumb injury, right, initial encounter 11/08/2017   Sciatica 07/10/2014   Low back pain 05/10/2012   UNSPECIFIED MYALGIA AND MYOSITIS 04/28/2008   Anxiety and depression 09/08/2006   Depressive disorder, not elsewhere classified 09/08/2006   ALLERGIC RHINITIS 09/06/2006   INSOMNIA 09/06/2006    Past Medical History:  Diagnosis Date   Allergy    Anal fissure    Anxiety    Arthritis    Depression    External hemorrhoids     Family History  Problem Relation Age of Onset   Colitis Mother    Basal cell carcinoma Mother    Arthritis Mother    Crohn's disease Father        questionable diagnosis   Protein  C deficiency Sister    Deep vein thrombosis Sister    Pulmonary embolism Sister    Other Sister        pre-diabetes   Other Sister        pre-diabetes   Ankylosing spondylitis Sister    Colitis Sister    Other Brother        pre-diabetes   Heart disease Paternal Grandmother    Colon cancer Neg Hx    Cancer Neg Hx    Drug abuse Neg Hx    Stroke Neg Hx    Past Surgical History:  Procedure Laterality Date   BREAST BIOPSY Right  06/20/2022   MM RT BREAST BX W LOC DEV 1ST LESION IMAGE BX SPEC STEREO GUIDE 06/20/2022 [facility]   COLONOSCOPY N/A 12/14/2012   Procedure: COLONOSCOPY;  Surgeon: Lafayette Dragon, MD;  Location: [facility];  Service: Endoscopy;  Laterality: N/A;   ORIF FINGER / THUMB FRACTURE Right    WISDOM TOOTH EXTRACTION     Social History   Social History Narrative   Works at [employer] with fiance   No children    Immunization History  Administered Date(s) Administered   Influenza,inj,Quad PF,6+ Mos 03/27/2017, 04/03/2018, 02/26/2019, 02/02/2021   Moderna Sars-Covid-2 Vaccination 11/13/2019, 12/11/2019   Td 06/29/2009, 11/06/2017     Objective: Vital Signs: BP 112/76 (BP Location: Left Arm, Patient Position: Sitting, Cuff Size: Normal)   Pulse 89   Resp 16   Ht '5\' 4"'$  (1.626 m)   Wt 147 lb (66.7 kg)   LMP 06/24/2022   BMI 25.23 kg/m    Physical Exam Vitals and nursing note reviewed.  Constitutional:      Appearance: She is well-developed.  HENT:     Head: Normocephalic and atraumatic.  Eyes:     Conjunctiva/sclera: Conjunctivae normal.  Cardiovascular:     Rate and Rhythm: Normal rate and regular rhythm.     Heart sounds: Normal heart sounds.  Pulmonary:     Effort: Pulmonary effort is normal.     Breath sounds: Normal breath sounds.  Abdominal:     General: Bowel sounds are normal.     Palpations: Abdomen is soft.  Musculoskeletal:     Cervical back: Normal range of motion.  Skin:    General: Skin is warm and dry.     Capillary Refill: Capillary refill takes less than 2 seconds.     Comments:  Psoriasis on scalp   Neurological:     Mental Status: She is alert and oriented to person, place, and time.  Psychiatric:        Behavior: Behavior normal.       Musculoskeletal Exam: C-spine, thoracic spine, and lumbar spine good ROM. No SI joint tenderness today.  Some tenderness in the lumbar region.  Shoulder joints, elbow joints, wrist joints, MCPs, PIPs,  and DIPs good ROM with no synovitis.  Complete fist formation bilaterally.  Tenderness of the right 2nd MCP but no synovitis noted. Tenderness and mild synovitis of the left 2nd MCP joint.  Mild tenderness over the lateral epicondyle of both elbows.  Hip joints have good ROM with no groin pain.  Knee joints have good ROM with no warmth or effusion.  Ankle joints have good ROM with no tenderness or joint swelling.   CDAI Exam: CDAI Score: -- Patient Global: --; Provider Global: -- Swollen: --; Tender: -- Joint Exam 06/28/2022   No joint exam has been documented for this visit  There is currently no information documented on the homunculus. Go to the Rheumatology activity and complete the homunculus joint exam.  Investigation: No additional findings.  Imaging: MM RT BREAST BX W LOC DEV 1ST LESION IMAGE BX SPEC STEREO GUIDE  Addendum Date: 06/21/2022   ADDENDUM REPORT: 06/21/2022 13:36 ADDENDUM: Pathology revealed FIBROCYSTIC CHANGE WITH APOCRINE METAPLASIA, PATCHY PSEUDOANGIOMATOUS STROMAL HYPERPLASIA AND CALCIFICATIONS- NEGATIVE FOR CARCINOMA of the RIGHT breast, lateral, (ribbon clip). This was found to be concordant by Dr. Nolon Nations. Pathology results were discussed with the patient by telephone. The patient reported doing well after the biopsy with tenderness at the site. Post biopsy instructions and care were reviewed and questions were answered. The patient was encouraged to call [facility] for any additional concerns. The patient was instructed to return for annual screening mammography when due- January 2025. Pathology results reported by Stacie Acres RN on 06/21/2022. Electronically Signed   By: Nolon Nations M.D.   On: 06/21/2022 13:36   Result Date: 06/21/2022 CLINICAL DATA:  The patient presents for stereotactic biopsy of RIGHT breast calcifications. EXAM: RIGHT BREAST STEREOTACTIC CORE NEEDLE BIOPSY COMPARISON:  Previous exam(s). FINDINGS: The  patient and I discussed the procedure of stereotactic-guided biopsy including benefits and alternatives. We discussed the high likelihood of a successful procedure. We discussed the risks of the procedure including infection, bleeding, tissue injury, clip migration, and inadequate sampling. Informed written consent was given. The usual time out protocol was performed immediately prior to the procedure. Using sterile technique and lidocaine and lidocaine with epinephrine as local anesthetic, under stereotactic guidance, a 9 gauge vacuum assisted device was used to perform core needle biopsy of calcifications in the LATERAL portion of the RIGHT breast using a craniocaudal approach. Specimen radiograph was performed showing calcifications in numerous tissue samples. Specimens with calcifications are identified for pathology. Lesion quadrant: LATERAL At the conclusion of the procedure, ribbon shaped tissue marker clip was deployed into the biopsy cavity. Follow-up 2-view mammogram was performed and dictated separately. IMPRESSION: Stereotactic-guided biopsy of RIGHT breast calcifications. No apparent complications. Electronically Signed: By: Nolon Nations M.D. On: 06/20/2022 11:16  MM CLIP PLACEMENT RIGHT  Result Date: 06/20/2022 CLINICAL DATA:  Patient presents for stereotactic biopsy of RIGHT breast calcifications. EXAM: 3D DIAGNOSTIC RIGHT MAMMOGRAM POST STEREOTACTIC BIOPSY COMPARISON:  Previous exam(s). FINDINGS: 3D Mammographic images were obtained following stereotactic guided biopsy of calcifications in the LATERAL portion of the RIGHT breast and placement of a ribbon shaped clip. The biopsy marking clip is in expected position at the site of biopsy. IMPRESSION: Appropriate positioning of the ribbon shaped biopsy marking clip at the site of biopsy in the LATERAL RIGHT breast. Final Assessment: Post Procedure Mammograms for Marker Placement Electronically Signed   By: Nolon Nations M.D.   On: 06/20/2022  11:19  MM DIAG BREAST TOMO BILATERAL  Result Date: 06/13/2022 CLINICAL DATA:  Screening recall for a left breast asymmetry and a possible right breast mass. EXAM: DIGITAL DIAGNOSTIC BILATERAL MAMMOGRAM WITH TOMOSYNTHESIS TECHNIQUE: Bilateral digital diagnostic mammography and breast tomosynthesis was performed. COMPARISON:  Previous exam(s). ACR Breast Density Category b: There are scattered areas of fibroglandular density. FINDINGS: Spot compression tomosynthesis images through the medial aspect of the left breast demonstrates resolution of the asymmetry of concern. Spot compression tomosynthesis images through the upper outer right breast demonstrates resolution of the mass/asymmetry, however there appear to be fine linear calcifications in the upper outer posterior right breast spanning approximately 7 mm. These are not well visualized  on the lateral view. IMPRESSION: 1.  Indeterminate calcifications in the upper-outer right breast. 2.  Resolution of the mammographic abnormality in the left breast. RECOMMENDATION: Stereotactic biopsy is recommended for the right breast calcifications. We will schedule the patient for the procedure at her earliest convenience. I have discussed the findings and recommendations with the patient. If applicable, a reminder letter will be sent to the patient regarding the next appointment. BI-RADS CATEGORY  4: Suspicious. Electronically Signed   By: Ammie Ferrier M.D.   On: 06/13/2022 14:51   Recent Labs: Lab Results  Component Value Date   WBC 6.9 04/11/2022   HGB 13.4 04/11/2022   PLT 230 04/11/2022   NA 139 04/11/2022   K 4.0 04/11/2022   CL 102 04/11/2022   CO2 27 04/11/2022   GLUCOSE 81 04/11/2022   BUN 13 04/11/2022   CREATININE 0.83 04/11/2022   BILITOT 0.5 04/11/2022   ALKPHOS 40 07/10/2019   AST 19 04/11/2022   ALT 19 04/11/2022   PROT 7.4 04/11/2022   PROT 7.1 04/11/2022   ALBUMIN 4.3 07/10/2019   CALCIUM 9.7 04/11/2022   GFRAA  10/05/2008     >60        The eGFR has been calculated using the MDRD equation. This calculation has not been validated in all clinical situations. eGFR's persistently <60 mL/min signify possible Chronic Kidney Disease.   QFTBGOLDPLUS NEGATIVE 04/11/2022    Speciality Comments: No specialty comments available.  Procedures:  No procedures performed Allergies: Patient has no known allergies.     Assessment / Plan:     Visit Diagnoses: Psoriatic arthritis ([place]) - History of inflammatory arthritis.  Pain in right shoulder, bilateral lateral epicondylitis, left second MCP synovitis, chronic SI joint pain: She has mild tenderness and synovitis over the left second MCP joint on examination today.  No SI joint tenderness upon palpation today.  No evidence of Achilles tendinitis or plantar fasciitis.  She has noticed about a 50% improvement in the discomfort and stiffness in her elbows and hands since initiating Stelara on 05/18/2022.  She has had a total of 2 doses so far and has not noticed any injection site reactions or side effects.  She has not had any recent or recurrent infections.  She is willing to give Stelara more time and for [place] to reassess for the full efficacy in 2 to 3 months.  She was advised to notify us if she develops signs or symptoms of a flare.    Psoriasis: Scalp-Unchanged.  She uses clobetasol 0.05% external solution as needed.   High risk medication use - Stelara 45 mg sq injections every 12 weeks.  Initiated Stelara on 05/18/22.  CBC and CMP updated on 04/11/22. Orders for CBC and CMP released today.  Her next lab work will be due at the end of may and every 3 months.  TB gold negative on 04/11/22.  Discussed the importance of holding stelara if she develops signs or symptoms of an infection and to resume once the infection has completely cleared.  Plan: CBC with Differential/Platelet, COMPLETE METABOLIC PANEL WITH GFR  HLA B27 positive  Pain in both hands - Synovitis was noted  over left second MCP joint.  X-rays were unremarkable.  All autoimmune labs negative.  HLA-B27 positive.  Shoulder impingement syndrome, right: She continues to have some discomfort in the right shoulder.   Lateral epicondylitis of both elbows: Improving since initiating stelara.  She has been experiencing less nocturnal pain.  Less joint stiffness.  Mild tenderness upon palpation today.    Chronic SI joint pain: No SI tenderness or nocturnal pain at this time.   Pain in both feet -  X-rays were unremarkable.   Collagenous colitis: Colonoscopy by Dr. Maurene Capes in the past and was diagnosed with collagenous colitis. Patient states she was treated with Lialda in the past. She has had 2 bouts of collagenous colitis.   DDD (degenerative disc disease), lumbar: She continues to experience some discomfort in her lower back which she attributes to previously having 2 ruptured disks.  She has no symptoms of radiculopathy at this time.  No SI joint tenderness at this time.  Myalgia: Improving.   Other fatigue: Improving.   Other medical conditions are listed as follows:   Anxiety and depression  Adjustment insomnia  Family history of ankylosing spondylitis-Sister  Family history of ulcerative colitis-mother  Family history of Crohn's disease-father  Orders: Orders Placed This Encounter  Procedures   CBC with Differential/Platelet   COMPLETE METABOLIC PANEL WITH GFR   No orders of the defined types were placed in this encounter.    Follow-Up Instructions: Return in about 3 months (around 09/26/2022).   Ofilia Neas, PA-C  Note - This record has been created using Dragon software.  Chart creation errors have been sought, but may not always  have been located. Such creation errors do not reflect on  the standard of medical care.

## 2022-06-15 ENCOUNTER — Other Ambulatory Visit: Payer: Self-pay | Admitting: Family

## 2022-06-15 DIAGNOSIS — R921 Mammographic calcification found on diagnostic imaging of breast: Secondary | ICD-10-CM

## 2022-06-20 ENCOUNTER — Ambulatory Visit
Admission: RE | Admit: 2022-06-20 | Discharge: 2022-06-20 | Disposition: A | Discharge status: Home / Self Care | Payer: 59 | Source: Ambulatory Visit | Attending: Family | Admitting: Family

## 2022-06-20 DIAGNOSIS — R921 Mammographic calcification found on diagnostic imaging of breast: Secondary | ICD-10-CM

## 2022-06-20 HISTORY — PX: BREAST BIOPSY: SHX20

## 2022-06-28 ENCOUNTER — Encounter: Payer: Self-pay | Admitting: Physician Assistant

## 2022-06-28 ENCOUNTER — Ambulatory Visit: Payer: 59 | Attending: Physician Assistant | Admitting: Physician Assistant

## 2022-06-28 VITALS — BP 112/76 | HR 89 | Resp 16 | Ht 64.0 in | Wt 147.0 lb

## 2022-06-28 DIAGNOSIS — M7712 Lateral epicondylitis, left elbow: Secondary | ICD-10-CM

## 2022-06-28 DIAGNOSIS — Z8379 Family history of other diseases of the digestive system: Secondary | ICD-10-CM

## 2022-06-28 DIAGNOSIS — L4050 Arthropathic psoriasis, unspecified: Secondary | ICD-10-CM

## 2022-06-28 DIAGNOSIS — Z1589 Genetic susceptibility to other disease: Secondary | ICD-10-CM | POA: Diagnosis not present

## 2022-06-28 DIAGNOSIS — M79671 Pain in right foot: Secondary | ICD-10-CM

## 2022-06-28 DIAGNOSIS — L409 Psoriasis, unspecified: Secondary | ICD-10-CM

## 2022-06-28 DIAGNOSIS — M7541 Impingement syndrome of right shoulder: Secondary | ICD-10-CM

## 2022-06-28 DIAGNOSIS — G8929 Other chronic pain: Secondary | ICD-10-CM

## 2022-06-28 DIAGNOSIS — F5102 Adjustment insomnia: Secondary | ICD-10-CM

## 2022-06-28 DIAGNOSIS — Z79899 Other long term (current) drug therapy: Secondary | ICD-10-CM | POA: Diagnosis not present

## 2022-06-28 DIAGNOSIS — R5383 Other fatigue: Secondary | ICD-10-CM

## 2022-06-28 DIAGNOSIS — M5136 Other intervertebral disc degeneration, lumbar region: Secondary | ICD-10-CM

## 2022-06-28 DIAGNOSIS — F32A Depression, unspecified: Secondary | ICD-10-CM

## 2022-06-28 DIAGNOSIS — K52831 Collagenous colitis: Secondary | ICD-10-CM

## 2022-06-28 DIAGNOSIS — F419 Anxiety disorder, unspecified: Secondary | ICD-10-CM

## 2022-06-28 DIAGNOSIS — M533 Sacrococcygeal disorders, not elsewhere classified: Secondary | ICD-10-CM

## 2022-06-28 DIAGNOSIS — M791 Myalgia, unspecified site: Secondary | ICD-10-CM

## 2022-06-28 DIAGNOSIS — M79672 Pain in left foot: Secondary | ICD-10-CM

## 2022-06-28 DIAGNOSIS — Z8269 Family history of other diseases of the musculoskeletal system and connective tissue: Secondary | ICD-10-CM

## 2022-06-28 DIAGNOSIS — M7711 Lateral epicondylitis, right elbow: Secondary | ICD-10-CM

## 2022-06-28 DIAGNOSIS — M79642 Pain in left hand: Secondary | ICD-10-CM

## 2022-06-28 DIAGNOSIS — M79641 Pain in right hand: Secondary | ICD-10-CM

## 2022-08-15 ENCOUNTER — Encounter: Payer: Self-pay | Admitting: *Deleted

## 2022-08-25 ENCOUNTER — Other Ambulatory Visit: Payer: Self-pay

## 2022-08-25 DIAGNOSIS — Z79899 Other long term (current) drug therapy: Secondary | ICD-10-CM

## 2022-08-26 LAB — COMPLETE METABOLIC PANEL WITH GFR
AG Ratio: 2.3 (calc) (ref 1.0–2.5)
ALT: 40 U/L — ABNORMAL HIGH (ref 6–29)
AST: 27 U/L (ref 10–35)
Albumin: 4.9 g/dL (ref 3.6–5.1)
Alkaline phosphatase (APISO): 44 U/L (ref 31–125)
BUN: 16 mg/dL (ref 7–25)
CO2: 26 mmol/L (ref 20–32)
Calcium: 9.7 mg/dL (ref 8.6–10.2)
Chloride: 104 mmol/L (ref 98–110)
Creat: 0.8 mg/dL (ref 0.50–0.99)
Globulin: 2.1 g/dL (calc) (ref 1.9–3.7)
Glucose, Bld: 112 mg/dL — ABNORMAL HIGH (ref 65–99)
Potassium: 4.6 mmol/L (ref 3.5–5.3)
Sodium: 139 mmol/L (ref 135–146)
Total Bilirubin: 0.5 mg/dL (ref 0.2–1.2)
Total Protein: 7 g/dL (ref 6.1–8.1)
eGFR: 93 mL/min/{1.73_m2} (ref >=60)

## 2022-08-26 LAB — CBC WITH DIFFERENTIAL/PLATELET
Absolute Monocytes: 599 cells/uL (ref 200–950)
Basophils Absolute: 51 cells/uL (ref 0–200)
Basophils Relative: 0.7 %
Eosinophils Absolute: 226 cells/uL (ref 15–500)
Eosinophils Relative: 3.1 %
HCT: 39.9 % (ref 35.0–45.0)
Hemoglobin: 13.3 g/dL (ref 11.7–15.5)
Lymphs Abs: 1898 cells/uL (ref 850–3900)
MCH: 28.7 pg (ref 27.0–33.0)
MCHC: 33.3 g/dL (ref 32.0–36.0)
MCV: 86 fL (ref 80.0–100.0)
MPV: 9.7 fL (ref 7.5–12.5)
Monocytes Relative: 8.2 %
Neutro Abs: 4526 cells/uL (ref 1500–7800)
Neutrophils Relative %: 62 %
Platelets: 245 10*3/uL (ref 140–400)
RBC: 4.64 10*6/uL (ref 3.80–5.10)
RDW: 12.3 % (ref 11.0–15.0)
Total Lymphocyte: 26 %
WBC: 7.3 10*3/uL (ref 3.8–10.8)

## 2022-08-26 NOTE — Progress Notes (Signed)
Glucose is 112. ALT is borderline elevated-40. Rest of CMP WNL.  CBC WNL.   Avoid tylenol and alcohol use

## 2022-09-14 NOTE — Progress Notes (Signed)
Office Visit Note  Patient: [name] [name]             Date of Birth: 1975/07/21           MRN: [id number]             PCP: Sandford Craze, NP Referring: Sandford Craze, NP Visit Date: 09/28/2022 Occupation: @GUAROCC @  Subjective:  Medication monitoring   History of Present Illness: [name] [name] is a 47 y.o. female with history of psoriatic arthritis.  Patient is currently taking Stelara 45 mg sq injections every 12 weeks.  Initiated Stelara on 05/18/22.  She has been tolerating Stelara without any side effects or injection site reactions.  Patient continues to have some soreness and intermittent inflammation in bilateral second MCP joints.  She has noticed a cyst form on her right fifth PIP joint.  The discomfort and stiffness in her elbow joints has improved.  Overall she has noticed improvement in the pain and inflammation in her joints since initiating Stelara.  She has not had any signs or symptoms of a collagenous colitis flare recently.  She has been taking align as needed. She denies any recent or recurrent infections.  She denies any new medical conditions.   Activities of Daily Living:  Patient reports morning stiffness for a few minutes.   Patient Reports nocturnal pain.  Difficulty dressing/grooming: Denies Difficulty climbing stairs: Denies Difficulty getting out of chair: Denies Difficulty using hands for taps, buttons, cutlery, and/or writing: Reports  Review of Systems  Constitutional:  Positive for fatigue.  HENT:  Negative for mouth sores and mouth dryness.   Eyes:  Negative for dryness.  Respiratory:  Negative for shortness of breath.   Cardiovascular:  Negative for chest pain and palpitations.  Gastrointestinal:  Negative for blood in stool, constipation and diarrhea.  Endocrine: Negative for increased urination.  Genitourinary:  Negative for involuntary urination.  Musculoskeletal:  Positive for joint pain, joint pain, joint swelling, muscle  weakness and morning stiffness. Negative for gait problem, myalgias, muscle tenderness and myalgias.  Skin:  Negative for color change, rash, hair loss and sensitivity to sunlight.  Allergic/Immunologic: Positive for susceptible to infections.  Neurological:  Negative for dizziness and headaches.  Hematological:  Negative for swollen glands.  Psychiatric/Behavioral:  Negative for depressed mood and sleep disturbance. The patient is not nervous/anxious.     PMFS History:  Patient Active Problem List   Diagnosis Date Noted   GERD (gastroesophageal reflux disease) 05/03/2022   DDD (degenerative disc disease), lumbar 04/11/2022   Lateral epicondylitis, right elbow 04/27/2021   CMC (carpometacarpal) synovitis 02/15/2021   Lateral epicondylitis of left elbow 02/15/2021   Shoulder impingement syndrome, right 02/15/2021   Preventative health care 02/02/2021   Arthralgia 02/02/2021   Thumb injury, right, initial encounter 11/08/2017   Sciatica 07/10/2014   Low back pain 05/10/2012   UNSPECIFIED MYALGIA AND MYOSITIS 04/28/2008   Anxiety and depression 09/08/2006   Depressive disorder, not elsewhere classified 09/08/2006   ALLERGIC RHINITIS 09/06/2006   INSOMNIA 09/06/2006    Past Medical History:  Diagnosis Date   Allergy    Anal fissure    Anxiety    Arthritis    Depression    External hemorrhoids     Family History  Problem Relation Age of Onset   Colitis Mother    Basal cell carcinoma Mother    Arthritis Mother    Breast cancer Mother    Crohn's disease Father  questionable diagnosis   Protein C deficiency Sister    Deep vein thrombosis Sister    Pulmonary embolism Sister    Other Sister        pre-diabetes   Other Sister        pre-diabetes   Ankylosing spondylitis Sister    Colitis Sister    Other Brother        pre-diabetes   Heart disease Paternal Grandmother    Colon cancer Neg Hx    Cancer Neg Hx    Drug abuse Neg Hx    Stroke Neg Hx    Past Surgical  History:  Procedure Laterality Date   BREAST BIOPSY Right 06/20/2022   MM RT BREAST BX W LOC DEV 1ST LESION IMAGE BX SPEC STEREO GUIDE 06/20/2022 [facility]   COLONOSCOPY N/A 12/14/2012   Procedure: COLONOSCOPY;  Surgeon: Hart Carwin, MD;  Location: [facility];  Service: Endoscopy;  Laterality: N/A;   ORIF FINGER / THUMB FRACTURE Right    WISDOM TOOTH EXTRACTION     Social History   Social History Narrative   Works at [employer] with fiance   No children    Immunization History  Administered Date(s) Administered   Influenza,inj,Quad PF,6+ Mos 03/27/2017, 04/03/2018, 02/26/2019, 02/02/2021   Moderna Sars-Covid-2 Vaccination 11/13/2019, 12/11/2019   Td 06/29/2009, 11/06/2017     Objective: Vital Signs: BP 123/85 (BP Location: Left Arm, Patient Position: Sitting, Cuff Size: Normal)   Pulse 79   Resp 15   Ht 5\' 4"  (1.626 m)   Wt 145 lb (65.8 kg)   BMI 24.89 kg/m    Physical Exam Vitals and nursing note reviewed.  Constitutional:      Appearance: She is well-developed.  HENT:     Head: Normocephalic and atraumatic.  Eyes:     Conjunctiva/sclera: Conjunctivae normal.  Cardiovascular:     Rate and Rhythm: Normal rate and regular rhythm.     Heart sounds: Normal heart sounds.  Pulmonary:     Effort: Pulmonary effort is normal.     Breath sounds: Normal breath sounds.  Abdominal:     General: Bowel sounds are normal.     Palpations: Abdomen is soft.  Musculoskeletal:     Cervical back: Normal range of motion.  Lymphadenopathy:     Cervical: No cervical adenopathy.  Skin:    General: Skin is warm and dry.     Capillary Refill: Capillary refill takes less than 2 seconds.  Neurological:     Mental Status: She is alert and oriented to person, place, and time.  Psychiatric:        Behavior: Behavior normal.      Musculoskeletal Exam: C-spine has good range of motion with no discomfort.  Shoulder joints, elbow joints, wrist joints, MCPs, PIPs, DIPs  have good range of motion with no synovitis.  Some tenderness and thickening of the bilateral second MCP joints.  Mucin cyst noted overlying the right fifth PIP joint.  Complete fist formation noted bilaterally.  Hip joints have good range of motion with no groin pain.  Knee joints have good range of motion with no warmth or effusion.  Ankle joints have good range of motion with no tenderness or joint swelling.  No evidence of Achilles tendinitis or plantar fasciitis.  CDAI Exam: CDAI Score: -- Patient Global: --; Provider Global: -- Swollen: --; Tender: -- Joint Exam 09/28/2022   No joint exam has been documented for this visit   There is  currently no information documented on the homunculus. Go to the Rheumatology activity and complete the homunculus joint exam.  Investigation: No additional findings.  Imaging: No results found.  Recent Labs: Lab Results  Component Value Date   WBC 7.3 08/25/2022   HGB 13.3 08/25/2022   PLT 245 08/25/2022   NA 139 08/25/2022   K 4.6 08/25/2022   CL 104 08/25/2022   CO2 26 08/25/2022   GLUCOSE 112 (H) 08/25/2022   BUN 16 08/25/2022   CREATININE 0.80 08/25/2022   BILITOT 0.5 08/25/2022   ALKPHOS 40 07/10/2019   AST 27 08/25/2022   ALT 40 (H) 08/25/2022   PROT 7.0 08/25/2022   ALBUMIN 4.3 07/10/2019   CALCIUM 9.7 08/25/2022   GFRAA  10/05/2008    >60        The eGFR has been calculated using the MDRD equation. This calculation has not been validated in all clinical situations. eGFR's persistently <60 mL/min signify possible Chronic Kidney Disease.   QFTBGOLDPLUS NEGATIVE 04/11/2022    Speciality Comments: No specialty comments available.  Procedures:  No procedures performed Allergies: Patient has no known allergies.    Assessment / Plan:     Visit Diagnoses: Psoriatic arthritis (HCC) - History of inflammatory arthritis.  Pain in right shoulder, bilateral lateral epicondylitis, left second MCP synovitis, chronic SI joint  pain: No synovitis or dactylitis was noted on examination today.  The lateral epicondylitis of both elbows has resolved.  No evidence of Achilles tendinitis or plantar fasciitis.  All she has noticed an improvement in the pain and inflammation in her hands and elbows since initiating Stelara.  There are psoriasis on her scalp is also almost completely resolved.  She has not needed to use any topical agents recently. She was started on Stelara 45 mg sq injections every 12 weeks on 05/18/22.  She is tolerating stelara without any side effects or injection site reactions.  She will remain on Stelara as prescribed.  She was vies notify us if she develops signs or symptoms of a flare.  She will follow-up in the office in 3 months or sooner if needed.  Psoriasis - Scalp--Improved since initiating Stelara.  She is no longer having to use topical agents.  She will remain on Stelara as prescribed.  High risk medication use - Stelara 45 mg sq injections every 12 weeks. Initiated Stelara on 05/18/22. CBC and CMP updated on 08/25/22.  Her next lab work will be due in July and every 3 months.  TB Gold negative on 04/11/22. No recent or recurrent infections.  Discussed the importance of holding stelara if he develops signs or symptoms of an infection and to resume once the infection has completely cleared.   HLA B27 positive  Pain in both hands: Some thickening and tenderness over bilateral second MCP joints.  No synovitis or dactylitis noted today.  Shoulder impingement syndrome, right: Intermittent discomfort.  Good ROM of examination today.   Lateral epicondylitis of both elbows: Resolved since initiating Stelara.    Chronic SI joint pain: Improved.   Pain in both feet: No evidence of Achilles tendinitis or plantar fasciitis at this time.   Collagenous colitis - Colonoscopy by Dr. Dickie La in the past and was diagnosed with collagenous colitis. Patient states she was treated with Lialda in the past.  Taking  Align PRN.  No recent flares.   DDD (degenerative disc disease), lumbar-History of bulging disc-Chronic pain.  Exacerbated by certain activities.   Myalgia: Improved.   Other fatigue:  Stable.   Other medical conditions are listed as follows:   Anxiety and depression  Adjustment insomnia  Family history of ankylosing spondylitis-Sister  Family history of ulcerative colitis-mother  Family history of Crohn's disease-father  Orders: No orders of the defined types were placed in this encounter.  No orders of the defined types were placed in this encounter.   Follow-Up Instructions: Return in about 3 months (around 12/29/2022) for Psoriatic arthritis.   Gearldine Bienenstock, PA-C  Note - This record has been created using Dragon software.  Chart creation errors have been sought, but may not always  have been located. Such creation errors do not reflect on  the standard of medical care.

## 2022-09-28 ENCOUNTER — Encounter: Payer: Self-pay | Admitting: Physician Assistant

## 2022-09-28 ENCOUNTER — Ambulatory Visit: Payer: 59 | Attending: Physician Assistant | Admitting: Physician Assistant

## 2022-09-28 VITALS — BP 123/85 | HR 79 | Resp 15 | Ht 64.0 in | Wt 145.0 lb

## 2022-09-28 DIAGNOSIS — M7541 Impingement syndrome of right shoulder: Secondary | ICD-10-CM

## 2022-09-28 DIAGNOSIS — M7711 Lateral epicondylitis, right elbow: Secondary | ICD-10-CM

## 2022-09-28 DIAGNOSIS — Z8379 Family history of other diseases of the digestive system: Secondary | ICD-10-CM

## 2022-09-28 DIAGNOSIS — F32A Depression, unspecified: Secondary | ICD-10-CM

## 2022-09-28 DIAGNOSIS — L4050 Arthropathic psoriasis, unspecified: Secondary | ICD-10-CM | POA: Diagnosis not present

## 2022-09-28 DIAGNOSIS — M79641 Pain in right hand: Secondary | ICD-10-CM

## 2022-09-28 DIAGNOSIS — F5102 Adjustment insomnia: Secondary | ICD-10-CM

## 2022-09-28 DIAGNOSIS — M5136 Other intervertebral disc degeneration, lumbar region: Secondary | ICD-10-CM

## 2022-09-28 DIAGNOSIS — M791 Myalgia, unspecified site: Secondary | ICD-10-CM

## 2022-09-28 DIAGNOSIS — M79671 Pain in right foot: Secondary | ICD-10-CM

## 2022-09-28 DIAGNOSIS — L409 Psoriasis, unspecified: Secondary | ICD-10-CM | POA: Diagnosis not present

## 2022-09-28 DIAGNOSIS — K52831 Collagenous colitis: Secondary | ICD-10-CM

## 2022-09-28 DIAGNOSIS — Z79899 Other long term (current) drug therapy: Secondary | ICD-10-CM

## 2022-09-28 DIAGNOSIS — Z1589 Genetic susceptibility to other disease: Secondary | ICD-10-CM

## 2022-09-28 DIAGNOSIS — M79642 Pain in left hand: Secondary | ICD-10-CM

## 2022-09-28 DIAGNOSIS — M79672 Pain in left foot: Secondary | ICD-10-CM

## 2022-09-28 DIAGNOSIS — G8929 Other chronic pain: Secondary | ICD-10-CM

## 2022-09-28 DIAGNOSIS — Z8269 Family history of other diseases of the musculoskeletal system and connective tissue: Secondary | ICD-10-CM

## 2022-09-28 DIAGNOSIS — M7712 Lateral epicondylitis, left elbow: Secondary | ICD-10-CM

## 2022-09-28 DIAGNOSIS — F419 Anxiety disorder, unspecified: Secondary | ICD-10-CM

## 2022-09-28 DIAGNOSIS — M533 Sacrococcygeal disorders, not elsewhere classified: Secondary | ICD-10-CM

## 2022-09-28 DIAGNOSIS — R5383 Other fatigue: Secondary | ICD-10-CM

## 2022-09-28 NOTE — Patient Instructions (Signed)
Standing Labs We placed an order today for your standing lab work.   Please have your standing labs drawn in July and every 3 months   Please have your labs drawn 2 weeks prior to your appointment so that the provider can discuss your lab results at your appointment, if possible.  Please note that you may see your imaging and lab results in MyChart before we have reviewed them. We will contact you once all results are reviewed. Please allow our office up to 72 hours to thoroughly review all of the results before contacting the office for clarification of your results.  WALK-IN LAB HOURS  Monday through Thursday from 8:00 am -12:30 pm and 1:00 pm-5:00 pm and Friday from 8:00 am-12:00 pm.  Patients with office visits requiring labs will be seen before walk-in labs.  You may encounter longer than normal wait times. Please allow additional time. Wait times may be shorter on  Monday and Thursday afternoons.  We do not book appointments for walk-in labs. We appreciate your patience and understanding with our staff.   Labs are drawn by [facility]. Please bring your co-pay at the time of your lab draw.  You may receive a bill from [facility] for your lab work.  Please note if you are on Hydroxychloroquine and and an order has been placed for a Hydroxychloroquine level,  you will need to have it drawn 4 hours or more after your last dose.  If you wish to have your labs drawn at another location, please call the office 24 hours in advance so we can fax the orders.  The office is located at [address], [address], , [place] [address]   If you have any questions regarding directions or hours of operation,  please call [phone].   As a reminder, please drink plenty of water prior to coming for your lab work. Thanks!  

## 2022-10-06 ENCOUNTER — Other Ambulatory Visit: Payer: Self-pay | Admitting: Family

## 2022-10-17 ENCOUNTER — Other Ambulatory Visit: Payer: Self-pay | Admitting: Rheumatology

## 2022-10-17 DIAGNOSIS — L409 Psoriasis, unspecified: Secondary | ICD-10-CM

## 2022-10-17 DIAGNOSIS — L4050 Arthropathic psoriasis, unspecified: Secondary | ICD-10-CM

## 2022-10-17 NOTE — Telephone Encounter (Signed)
Last Fill: 05/18/2022  Labs: 08/25/2022 Glucose is 112. ALT is borderline elevated-40. Rest of CMP WNL. CBC WNL.  TB Gold: 04/11/2022 Neg    Next Visit: 12/27/2022  Last Visit: 09/28/2022  [id number] arthritis   Current Dose per office note 09/28/2022: Stelara 45 mg sq injections every 12 weeks   Okay to refill Stelara?

## 2022-11-01 ENCOUNTER — Other Ambulatory Visit: Payer: Self-pay | Admitting: Family

## 2022-11-08 ENCOUNTER — Telehealth: Payer: Self-pay | Admitting: Family

## 2022-11-08 MED ORDER — TRAZODONE HCL 100 MG PO TABS: ORAL_TABLET | ORAL | 1 refills | Status: DC

## 2022-11-08 NOTE — Addendum Note (Signed)
Addended by: Sandford Craze on: 11/08/2022 05:09 PM   Modules accepted: Orders

## 2022-11-08 NOTE — Telephone Encounter (Signed)
Pt said her Trazodone was denied because she is no longer under prescriber's care. Advised pt that it looks like she has another PCP with [facility]. Pt said that is her sister in law and she was helping with something but is not an actual PCP. Pt still considers Melissa her PCP. Please review and advise if refill can be sent in for her or if there is another reason why it cannot be refilled

## 2022-11-08 NOTE — Telephone Encounter (Signed)
Please advise 

## 2022-12-15 NOTE — Progress Notes (Unsigned)
Office Visit Note  Patient: [name] [name]             Date of Birth: February 06, 1976           MRN: [id number]             PCP: Sandford Craze, NP Referring: Sandford Craze, NP Visit Date: 12/27/2022 Occupation: @GUAROCC @  Subjective:  Pain in both hands   History of Present Illness: [name] [name] is a 47 y.o. female with history of psoriatic arthritis.  Patient remains on Stelara 45 mg sq injections every 12 weeks. Initiated Stelara on 05/18/22.  Patient states her last dose of stelara was administered on 11/30/22.  She has been tolerating stelara without any side effects or injection site reactions.  She has been experiencing breakthrough symptoms about 2 weeks prior to her Stelara injections.  She has not found Stelara to be as effective at managing her symptoms as initially expected.  She has had increased pain and stiffness in both hands and has noticed an increased inflammation in the right fifth PIP joint.  She has tried using Pennsaid topically which has been helpful at alleviating her discomfort.  She states that she has had flares of scalp psoriasis leading up to her Stelara injections.  She would like to discuss other treatment options today.  Patient states that her sister was on Humira for management of asthma which has been effective for her.  She would like to possibly discuss switching to Humira.  Activities of Daily Living:  Patient reports morning stiffness for 0 minutes.   Patient Reports nocturnal pain.  Difficulty dressing/grooming: Denies Difficulty climbing stairs: Denies Difficulty getting out of chair: Denies Difficulty using hands for taps, buttons, cutlery, and/or writing: Reports  Review of Systems  Constitutional:  Negative for fatigue.  HENT:  Negative for mouth sores and mouth dryness.   Eyes:  Negative for dryness.  Respiratory:  Negative for shortness of breath.   Cardiovascular:  Negative for chest pain and palpitations.  Gastrointestinal:   Negative for blood in stool, constipation and diarrhea.  Endocrine: Negative for increased urination.  Genitourinary:  Negative for involuntary urination.  Musculoskeletal:  Positive for joint pain, joint pain and joint swelling. Negative for gait problem, myalgias, muscle weakness, morning stiffness, muscle tenderness and myalgias.  Skin:  Negative for color change, rash, hair loss and sensitivity to sunlight.  Allergic/Immunologic: Negative for susceptible to infections.  Neurological:  Positive for headaches. Negative for dizziness.  Hematological:  Negative for swollen glands.  Psychiatric/Behavioral:  Positive for sleep disturbance. Negative for depressed mood. The patient is not nervous/anxious.     PMFS History:  Patient Active Problem List   Diagnosis Date Noted  . Weight gain 12/20/2022  . GERD (gastroesophageal reflux disease) 05/03/2022  . DDD (degenerative disc disease), lumbar 04/11/2022  . Lateral epicondylitis, right elbow 04/27/2021  . CMC (carpometacarpal) synovitis 02/15/2021  . Lateral epicondylitis of left elbow 02/15/2021  . Shoulder impingement syndrome, right 02/15/2021  . Preventative health care 02/02/2021  . Arthralgia 02/02/2021  . Thumb injury, right, initial encounter 11/08/2017  . Sciatica 07/10/2014  . Low back pain 05/10/2012  . UNSPECIFIED MYALGIA AND MYOSITIS 04/28/2008  . Anxiety and depression 09/08/2006  . Depressive disorder, not elsewhere classified 09/08/2006  . ALLERGIC RHINITIS 09/06/2006  . INSOMNIA 09/06/2006    Past Medical History:  Diagnosis Date  . Allergy   . Anal fissure   . Anxiety   . Arthritis   . Depression   .  External hemorrhoids     Family History  Problem Relation Age of Onset  . Colitis Mother   . Basal cell carcinoma Mother   . Arthritis Mother   . Breast cancer Mother   . Crohn's disease Father        questionable diagnosis  . Protein C deficiency Sister   . Deep vein thrombosis Sister   . Pulmonary  embolism Sister   . Other Sister        pre-diabetes  . Other Sister        pre-diabetes  . Ankylosing spondylitis Sister   . Colitis Sister   . Other Brother        pre-diabetes  . Heart disease Paternal Grandmother   . Colon cancer Neg Hx   . Cancer Neg Hx   . Drug abuse Neg Hx   . Stroke Neg Hx    Past Surgical History:  Procedure Laterality Date  . BREAST BIOPSY Right 06/20/2022   MM RT BREAST BX W LOC DEV 1ST LESION IMAGE BX SPEC STEREO GUIDE 06/20/2022 [facility]  . COLONOSCOPY N/A 12/14/2012   Procedure: COLONOSCOPY;  Surgeon: Hart Carwin, MD;  Location: [facility];  Service: Endoscopy;  Laterality: N/A;  . ORIF FINGER / THUMB FRACTURE Right   . WISDOM TOOTH EXTRACTION     Social History   Social History Narrative   Works at [employer] with fiance   No children    Immunization History  Administered Date(s) Administered  . Influenza,inj,Quad PF,6+ Mos 03/27/2017, 04/03/2018, 02/26/2019, 02/02/2021  . Moderna Sars-Covid-2 Vaccination 11/13/2019, 12/11/2019  . Td 06/29/2009, 11/06/2017     Objective: Vital Signs: BP 130/87 (BP Location: Left Arm, Patient Position: Sitting, Cuff Size: Normal)   Pulse 72   Resp 14   Ht 5\' 4"  (1.626 m)   Wt 152 lb (68.9 kg)   BMI 26.09 kg/m    Physical Exam Vitals and nursing note reviewed.  Constitutional:      Appearance: She is well-developed.  HENT:     Head: Normocephalic and atraumatic.  Eyes:     Conjunctiva/sclera: Conjunctivae normal.  Cardiovascular:     Rate and Rhythm: Normal rate and regular rhythm.     Heart sounds: Normal heart sounds.  Pulmonary:     Effort: Pulmonary effort is normal.     Breath sounds: Normal breath sounds.  Abdominal:     General: Bowel sounds are normal.     Palpations: Abdomen is soft.  Musculoskeletal:     Cervical back: Normal range of motion.  Lymphadenopathy:     Cervical: No cervical adenopathy.  Skin:    General: Skin is warm and dry.     Capillary  Refill: Capillary refill takes less than 2 seconds.  Neurological:     Mental Status: She is alert and oriented to person, place, and time.  Psychiatric:        Behavior: Behavior normal.     Musculoskeletal Exam: C-spine has good ROM.  Discomfort with ROM of lumbar spine.  No midline spinal tenderness or SI joint tenderness.  Shoulder joints, elbow joints, wrist joints, MCPs, PIPs, and DIPs good ROM with no synovitis.  Complete fist formation bilaterally. Hip joints, knee joints, and ankle joints have good ROM with on discomfort.  No warmth or effusion of knee joints.  No tenderness or swelling of ankle joints.  No evidence of achilles tendonitis or plantar fasciitis.    CDAI Exam: CDAI Score: -- Patient  Global: --; Provider Global: -- Swollen: --; Tender: -- Joint Exam 12/27/2022   No joint exam has been documented for this visit   There is currently no information documented on the homunculus. Go to the Rheumatology activity and complete the homunculus joint exam.  Investigation: No additional findings.  Imaging: No results found.  Recent Labs: Lab Results  Component Value Date   WBC 7.3 08/25/2022   HGB 13.3 08/25/2022   PLT 245 08/25/2022   NA 139 08/25/2022   K 4.6 08/25/2022   CL 104 08/25/2022   CO2 26 08/25/2022   GLUCOSE 112 (H) 08/25/2022   BUN 16 08/25/2022   CREATININE 0.80 08/25/2022   BILITOT 0.5 08/25/2022   ALKPHOS 40 07/10/2019   AST 27 08/25/2022   ALT 40 (H) 08/25/2022   PROT 7.0 08/25/2022   ALBUMIN 4.3 07/10/2019   CALCIUM 9.7 08/25/2022   GFRAA  10/05/2008    >60        The eGFR has been calculated using the MDRD equation. This calculation has not been validated in all clinical situations. eGFR's persistently <60 mL/min signify possible Chronic Kidney Disease.   QFTBGOLDPLUS NEGATIVE 04/11/2022    Speciality Comments: No specialty comments available.  Procedures:  No procedures performed Allergies: Patient has no known allergies.    Assessment / Plan:     Visit Diagnoses: Psoriatic arthritis (HCC) - History of inflammatory arthritis.  Pain in right shoulder, bilateral lateral epicondylitis, left second MCP synovitis, chronic SI joint pain: Patient was initiated on Stelara on 05/18/2022.  Her most recent injection of Stelara was administered on 11/30/2022.  The patient has been experiencing breakthrough symptoms leading up to each Stelara injection by at least 2 weeks.  She notices a recurrence of psoriasis on her scalp as well as increased pain and stiffness in both hands.  Patient presents today to discuss different treatment options.  Discussed the option of combination therapy versus switching to a different biologic.  Discussed the use of methotrexate today in detail but unfortunately her ALT was recently elevated (Patient drinks alcohol on occasion).  She is not currently on any contraception other than using condoms.  She is not a good candidate for methotrexate at this time.  Discussed the option of possibly adding Otezla as combination therapy but she would like to hold off at this time.  According to the patient her sister has been diagnosed with ankylosing spondylitis and is doing well on Humira.  She is interested in possibly switching Stelara to Humira.  Reviewed indications, contraindications, and potential side effects of Humira today in detail.  Consent was obtained.  Plan to apply for Humira through her insurance and once approved she will return to the office for the first injection of Humira once eligible to initiate therapy.  Patient was advised to notify us if she develops any new or worsening symptoms in the meantime.  She will follow-up in the office in 3-4 months to assess her response.   Counseled patient that Humira is a TNF blocking agent.  Counseled patient on purpose, proper use, and adverse effects of Humira.  Reviewed the most common adverse effects including infections, headache, and injection site reactions.  Discussed that there is the possibility of an increased risk of malignancy including non-melanoma skin cancer but it is not well understood if this increased risk is due to the medication or the disease state.  Advised patient to get yearly dermatology exams due to risk of skin cancer. Counseled patient that Humira  should be held prior to scheduled surgery.  Counseled patient to avoid live vaccines while on Humira.  Recommend annual influenza, PCV 15 or PCV20 or Pneumovax 23, and Shingrix as indicated.  Reviewed the importance of regular labs while on Humira therapy.  Will monitor CBC and CMP 1 month after starting and then every 3 months routinely thereafter. Will monitor TB gold annually. Standing orders placed.    Provided patient with medication education material and answered all questions.  Patient consented to Humira.  Will upload consent into the media tab.  Reviewed storage instructions of Humira.  Advised initial injection must be administered in office.  Patient verbalized understanding.   Dose will be for psoriatic arthritis Humira 40 mg every 14 days.  Prescription pending lab results and/or insurance approval.  Psoriasis - Scalp--Intermittent flares of psoriasis despite initiating Stelara.  She has been experiencing breakthrough symptoms leading up to each Stelara injection.  Plan to switch the patient from Stelara to Humira as discussed above.  Patient was advised to notify us if she would like a prescription for clobetasol shampoo or external solution in the future.  High risk medication use - Plan to apply for Humira 40 mg sq injections every 14 days.   Stelara 45 mg sq injections every 12 weeks--initiated Stelara on 05/18/22--last dose 11/30/22.  CBC and CMP updated on 08/25/22. Orders for CBC and CMP released.  She will require updated CBC and CMP 1 month after initiating Humira and then every 3 months. TB gold negative on 04/11/22.  No recent or recurrent infections.  Discussed the  importance of holding Humira if she develops signs or symptoms of an infection and to resume once the infection has completely cleared.   - Plan: COMPLETE METABOLIC PANEL WITH GFR, CBC with Differential/Platelet  HLA B27 positive  Shoulder impingement syndrome, right: Intermittent discomfort.  She has good ROM with mild discomfort.    Pain in both hands: Tenderness of both 1st and 2nd MCP joints and the right 5th PIP joint.  Lateral epicondylitis of both elbows: Intermittent discomfort.   Chronic SI joint pain: No SI joint tenderness upon palpation today.   Pain in both feet: She is not experiencing any increased discomfort in her feet at this time.  No evidence of achilles tendonitis or plantar fasciitis.   Collagenous colitis - Colonoscopy by Dr. Dickie La in the past and was diagnosed with collagenous colitis. Patient states she was treated with Lialda in the past.  Taking Align PRN.  DDD (degenerative disc disease), lumbar: Chronic lower back pain.   Other medical conditions are listed as follows:   Other fatigue: Stable.   Anxiety and depression  Adjustment insomnia  Family history of ankylosing spondylitis-Sister-On Humira.   Family history of ulcerative colitis-mother  Family history of Crohn's disease-father  Orders: Orders Placed This Encounter  Procedures  . COMPLETE METABOLIC PANEL WITH GFR  . CBC with Differential/Platelet   No orders of the defined types were placed in this encounter.  Follow-Up Instructions: Return in about 3 months (around 03/29/2023) for Psoriatic arthritis.   Gearldine Bienenstock, PA-C  Note - This record has been created using Dragon software.  Chart creation errors have been sought, but may not always  have been located. Such creation errors do not reflect on  the standard of medical care.

## 2022-12-20 ENCOUNTER — Ambulatory Visit (INDEPENDENT_AMBULATORY_CARE_PROVIDER_SITE_OTHER): Payer: 59 | Admitting: Family

## 2022-12-20 ENCOUNTER — Telehealth: Payer: Self-pay | Admitting: Family

## 2022-12-20 VITALS — BP 131/86 | HR 72 | Temp 98.3°F | Resp 16 | Ht 64.0 in | Wt 151.0 lb

## 2022-12-20 DIAGNOSIS — F419 Anxiety disorder, unspecified: Secondary | ICD-10-CM | POA: Diagnosis not present

## 2022-12-20 DIAGNOSIS — Z1211 Encounter for screening for malignant neoplasm of colon: Secondary | ICD-10-CM

## 2022-12-20 DIAGNOSIS — R739 Hyperglycemia, unspecified: Secondary | ICD-10-CM

## 2022-12-20 DIAGNOSIS — R635 Abnormal weight gain: Secondary | ICD-10-CM | POA: Diagnosis not present

## 2022-12-20 DIAGNOSIS — F32A Depression, unspecified: Secondary | ICD-10-CM

## 2022-12-20 DIAGNOSIS — G4700 Insomnia, unspecified: Secondary | ICD-10-CM | POA: Diagnosis not present

## 2022-12-20 LAB — TSH: TSH: 2.83 u[IU]/mL (ref 0.35–5.50)

## 2022-12-20 LAB — HEMOGLOBIN A1C: Hgb A1c MFr Bld: 5.3 % (ref 4.6–6.5)

## 2022-12-20 NOTE — Telephone Encounter (Signed)
Please call Dr. Cloretta Ned office to request Pap.

## 2022-12-20 NOTE — Progress Notes (Signed)
Subjective:     Patient ID: [name] [name], female    DOB: 1975-10-02, 47 y.o.   MRN: [id number]  Chief Complaint  Patient presents with   Insomnia    "Doing well on medication"   Depression    Here for follow up   Weight Gain    Patient complains of weight gain    Insomnia PMH includes: depression.   Depression        Associated symptoms include insomnia.    47 year old female presents to the clinic today for follow up with insomnia and depression. She states she is doing well with her sleeping with the Trazodone. She states she is sleeping most nights out of the week with no issues. Refills were sent last week for her medication. And with her depression she states that she is doing well with her Zoloft. No issues noted from her as far as her depression she states that she thinks she is stable. But while in office today she states that she is wanting to discuss weight loss. Education provided that most weight loss medications are wanting patients to be a certain BMI and she was under that thresh hold. She states that she feels like she is eating more over the past year due to be bored, stressed and just that food is taking good. She states she feels like she has gained over 20 pounds in the past year. Wants to discuss weight loss.   Last Weight  Most recent update: 12/20/2022 10:06 AM    Weight  68.5 kg (151 lb)                Health Maintenance Due  Topic Date Due   PAP SMEAR-Modifier  09/22/2018   COVID-19 Vaccine (3 - Moderna risk series) 01/08/2020   Colonoscopy  12/15/2022   INFLUENZA VACCINE  12/01/2022    Past Medical History:  Diagnosis Date   Allergy    Anal fissure    Anxiety    Arthritis    Depression    External hemorrhoids     Past Surgical History:  Procedure Laterality Date   BREAST BIOPSY Right 06/20/2022   MM RT BREAST BX W LOC DEV 1ST LESION IMAGE BX SPEC STEREO GUIDE 06/20/2022 [facility]   COLONOSCOPY N/A 12/14/2012   Procedure:  COLONOSCOPY;  Surgeon: Hart Carwin, MD;  Location: [facility];  Service: Endoscopy;  Laterality: N/A;   ORIF FINGER / THUMB FRACTURE Right    WISDOM TOOTH EXTRACTION      Family History  Problem Relation Age of Onset   Colitis Mother    Basal cell carcinoma Mother    Arthritis Mother    Breast cancer Mother    Crohn's disease Father        questionable diagnosis   Protein C deficiency Sister    Deep vein thrombosis Sister    Pulmonary embolism Sister    Other Sister        pre-diabetes   Other Sister        pre-diabetes   Ankylosing spondylitis Sister    Colitis Sister    Other Brother        pre-diabetes   Heart disease Paternal Grandmother    Colon cancer Neg Hx    Cancer Neg Hx    Drug abuse Neg Hx    Stroke Neg Hx     Social History   Socioeconomic History   Marital status: Single    Spouse name: Not  on file   Number of children: 0   Years of education: Not on file   Highest education level: Not on file  Occupational History    Employer: [employer]  Tobacco Use   Smoking status: Former    Current packs/day: 0.00    Types: Cigarettes    Quit date: 05/02/2001    Years since quitting: 21.6    Passive exposure: Past   Smokeless tobacco: Never  Vaping Use   Vaping status: Never Used  Substance and Sexual Activity   Alcohol use: Yes    Comment: 3 drinks a week   Drug use: No   Sexual activity: Yes    Partners: Male    Birth control/protection: Condom  Other Topics Concern   Not on file  Social History Narrative   Works at [employer] with fiance   No children    Social Determinants of Corporate investment banker Strain: Not on file  Food Insecurity: Not on file  Transportation Needs: Not on file  Physical Activity: Not on file  Stress: Not on file  Social Connections: Not on file  Intimate Partner Violence: Not on file    Outpatient Medications Prior to Visit  Medication Sig Dispense Refill   ascorbic acid (VITAMIN C) 500 MG  tablet Take 500 mg by mouth as needed.     cetirizine (ZYRTEC) 10 MG tablet Take 10 mg by mouth daily.       clobetasol (TEMOVATE) 0.05 % external solution Apply 1 Application topically daily as needed.     omeprazole (PRILOSEC) 20 MG capsule Take 20 mg by mouth daily.     OVER THE COUNTER MEDICATION daily. Provitalize     Probiotic Product (ALIGN) 4 MG CAPS Take 1 capsule by mouth as needed.     sertraline (ZOLOFT) 100 MG tablet TAKE 1 TABLET BY MOUTH TWICE A DAY 180 tablet 0   traZODone (DESYREL) 100 MG tablet TAKE 1 TABLET BY MOUTH EVERYDAY AT BEDTIME 90 tablet 1   tretinoin (RETIN-A) 0.05 % cream      ustekinumab (STELARA) 45 MG/0.5ML SOSY injection Inject 0.5 mLs (45 mg total) into the skin every 3 (three) months. 0.5 mL 0   Ascorbic Acid (VITAMIN C PO) Take by mouth daily. (Patient not taking: Reported on 06/28/2022)     No facility-administered medications prior to visit.    No Known Allergies  Review of Systems  Constitutional:  Negative for fever.  HENT:  Negative for nosebleeds and tinnitus.   Eyes:  Negative for double vision, pain and redness.  Respiratory:  Negative for cough and hemoptysis.   Cardiovascular:  Negative for chest pain, orthopnea and leg swelling.  Gastrointestinal:  Negative for abdominal pain, diarrhea, heartburn and nausea.  Genitourinary:  Negative for dysuria and frequency.  Musculoskeletal:  Negative for joint pain and neck pain.  Skin:  Negative for rash.  Neurological:  Negative for dizziness, tremors and weakness.  Psychiatric/Behavioral:  Positive for depression. The patient has insomnia.        Objective:    Physical Exam Constitutional:      Appearance: Normal appearance.  HENT:     Head: Normocephalic.     Nose: Nose normal.  Cardiovascular:     Rate and Rhythm: Normal rate and regular rhythm.  Pulmonary:     Effort: Pulmonary effort is normal.  Musculoskeletal:     Cervical back: Normal range of motion.  Neurological:      General: No  focal deficit present.     Mental Status: She is alert and oriented to person, place, and time. Mental status is at baseline.  Psychiatric:        Mood and Affect: Mood normal.        Behavior: Behavior normal.        Thought Content: Thought content normal.        Judgment: Judgment normal.      BP 131/86 (BP Location: Right Arm, Patient Position: Sitting, Cuff Size: Small)   Pulse 72   Temp 98.3 F (36.8 C) (Oral)   Resp 16   Ht 5\' 4"  (1.626 m)   Wt 151 lb (68.5 kg)   SpO2 98%   BMI 25.92 kg/m  Wt Readings from Last 3 Encounters:  12/20/22 151 lb (68.5 kg)  09/28/22 145 lb (65.8 kg)  06/28/22 147 lb (66.7 kg)       Assessment & Plan:   Problem List Items Addressed This Visit       Unprioritized   Weight gain    Discussed dietary strategies for weight loss as well as exercise goals.       Relevant Orders   TSH   INSOMNIA    Stable. Continue trazodone as prescribed.       Anxiety and depression - Primary    Stable. Continue Zoloft as prescribed      Other Visit Diagnoses     Screening for colon cancer       Relevant Orders   Ambulatory referral to Gastroenterology   Hyperglycemia       Relevant Orders   HgB A1c       I have discontinued Victorino Dike R. Jaffer's Ascorbic Acid (VITAMIN C PO). I am also having her maintain her cetirizine, Align, omeprazole, OVER THE COUNTER MEDICATION, ascorbic acid, clobetasol, tretinoin, sertraline, Stelara, and traZODone.  No orders of the defined types were placed in this encounter.

## 2022-12-20 NOTE — Assessment & Plan Note (Signed)
Stable. Continue Zoloft as prescribed

## 2022-12-20 NOTE — Telephone Encounter (Signed)
Electronic request sent. J.J.

## 2022-12-20 NOTE — Assessment & Plan Note (Signed)
Discussed dietary strategies for weight loss as well as exercise goals.

## 2022-12-20 NOTE — Assessment & Plan Note (Signed)
Stable.  Continue trazodone as prescribed.

## 2022-12-27 ENCOUNTER — Ambulatory Visit: Payer: 59 | Attending: Physician Assistant | Admitting: Physician Assistant

## 2022-12-27 ENCOUNTER — Encounter: Payer: Self-pay | Admitting: Physician Assistant

## 2022-12-27 ENCOUNTER — Telehealth: Payer: Self-pay | Admitting: Pharmacist

## 2022-12-27 VITALS — BP 130/87 | HR 72 | Resp 14 | Ht 64.0 in | Wt 152.0 lb

## 2022-12-27 DIAGNOSIS — L4050 Arthropathic psoriasis, unspecified: Secondary | ICD-10-CM | POA: Diagnosis not present

## 2022-12-27 DIAGNOSIS — M79672 Pain in left foot: Secondary | ICD-10-CM

## 2022-12-27 DIAGNOSIS — Z8379 Family history of other diseases of the digestive system: Secondary | ICD-10-CM

## 2022-12-27 DIAGNOSIS — K52831 Collagenous colitis: Secondary | ICD-10-CM

## 2022-12-27 DIAGNOSIS — Z79899 Other long term (current) drug therapy: Secondary | ICD-10-CM

## 2022-12-27 DIAGNOSIS — M533 Sacrococcygeal disorders, not elsewhere classified: Secondary | ICD-10-CM

## 2022-12-27 DIAGNOSIS — M7541 Impingement syndrome of right shoulder: Secondary | ICD-10-CM

## 2022-12-27 DIAGNOSIS — M7711 Lateral epicondylitis, right elbow: Secondary | ICD-10-CM

## 2022-12-27 DIAGNOSIS — F32A Depression, unspecified: Secondary | ICD-10-CM

## 2022-12-27 DIAGNOSIS — Z1589 Genetic susceptibility to other disease: Secondary | ICD-10-CM | POA: Diagnosis not present

## 2022-12-27 DIAGNOSIS — F419 Anxiety disorder, unspecified: Secondary | ICD-10-CM

## 2022-12-27 DIAGNOSIS — M79641 Pain in right hand: Secondary | ICD-10-CM

## 2022-12-27 DIAGNOSIS — G8929 Other chronic pain: Secondary | ICD-10-CM

## 2022-12-27 DIAGNOSIS — L409 Psoriasis, unspecified: Secondary | ICD-10-CM | POA: Diagnosis not present

## 2022-12-27 DIAGNOSIS — Z8269 Family history of other diseases of the musculoskeletal system and connective tissue: Secondary | ICD-10-CM

## 2022-12-27 DIAGNOSIS — F5102 Adjustment insomnia: Secondary | ICD-10-CM

## 2022-12-27 DIAGNOSIS — M79671 Pain in right foot: Secondary | ICD-10-CM

## 2022-12-27 DIAGNOSIS — M79642 Pain in left hand: Secondary | ICD-10-CM

## 2022-12-27 DIAGNOSIS — M7712 Lateral epicondylitis, left elbow: Secondary | ICD-10-CM

## 2022-12-27 DIAGNOSIS — M5136 Other intervertebral disc degeneration, lumbar region: Secondary | ICD-10-CM

## 2022-12-27 DIAGNOSIS — R5383 Other fatigue: Secondary | ICD-10-CM

## 2022-12-27 NOTE — Patient Instructions (Addendum)
Standing Labs We placed an order today for your standing lab work.   Please have your standing labs drawn in November and every 3 months   Please have your labs drawn 2 weeks prior to your appointment so that the provider can discuss your lab results at your appointment, if possible.  Please note that you may see your imaging and lab results in MyChart before we have reviewed them. We will contact you once all results are reviewed. Please allow our office up to 72 hours to thoroughly review all of the results before contacting the office for clarification of your results.  WALK-IN LAB HOURS  Monday through Thursday from 8:00 am -12:30 pm and 1:00 pm-5:00 pm and Friday from 8:00 am-12:00 pm.  Patients with office visits requiring labs will be seen before walk-in labs.  You may encounter longer than normal wait times. Please allow additional time. Wait times may be shorter on  Monday and Thursday afternoons.  We do not book appointments for walk-in labs. We appreciate your patience and understanding with our staff.   Labs are drawn by [facility]. Please bring your co-pay at the time of your lab draw.  You may receive a bill from [facility] for your lab work.  Please note if you are on Hydroxychloroquine and and an order has been placed for a Hydroxychloroquine level,  you will need to have it drawn 4 hours or more after your last dose.  If you wish to have your labs drawn at another location, please call the office 24 hours in advance so we can fax the orders.  The office is located at [address], [address], [place], [place] [address]   If you have any questions regarding directions or hours of operation,  please call [phone].   As a reminder, please drink plenty of water prior to coming for your lab work. Thanks!   Adalimumab Injection What is this medication? ADALIMUMAB (ay da LIM yoo mab) treats autoimmune conditions, such as psoriasis, arthritis, Crohn's disease, and ulcerative  colitis. It works by slowing down an overactive immune system. It belongs to a group of medications called TNF inhibitors. It is a monoclonal antibody. This medicine may be used for other purposes; ask your health care provider or pharmacist if you have questions. COMMON BRAND NAME(S): ABRILADA, AMJEVITA, CYLTEZO, HADLIMA, Hulio, Hulio PEN, Humira, HUMIRA PEN, Hyrimoz, Idacio, Simlandi, Yuflyma, YUSIMRY What should I tell my care team before I take this medication? They need to know if you have any of these conditions: Cancer Diabetes (high blood sugar) Having surgery Heart disease Hepatitis B Immune system problems Infections, such as tuberculosis (TB) or other bacterial, fungal, or viral infections Multiple sclerosis Recent or upcoming vaccine An unusual or allergic reaction to adalimumab, mannitol, latex, rubber, other medications, foods, dyes, or preservatives Pregnant or trying to get pregnant Breast-feeding How should I use this medication? This medication is injected under the skin. It may be given by your care team in a hospital or clinic setting. It may also be given at home. If you get this medication at home, you will be taught how to prepare and give it. Use exactly as directed. Take it as directed on the prescription label. Keep taking it unless your care team tells you to stop. This medication comes with INSTRUCTIONS FOR USE. Ask your pharmacist for directions on how to use this medication. Read the information carefully. Talk to your pharmacist or care team if you have questions. It is important that  you put your used needles and syringes in a special sharps container. Do not put them in a trash can. If you do not have a sharps container, call your pharmacist or care team to get one. A special MedGuide will be given to you by the pharmacist with each prescription and refill. If you are getting this medication in a hospital or clinic, a special MedGuide will be given to you before  each treatment. Be sure to read this information carefully each time. Talk to your care team about the use of this medication in children. While it be prescribed for children as young as 2 years for selected conditions, precautions do apply. Overdosage: If you think you have taken too much of this medicine contact a poison control center or emergency room at once. NOTE: This medicine is only for you. Do not share this medicine with others. What if I miss a dose? If you get this medication at the hospital or clinic: it is important not to miss your dose. Call your care team if you are unable to keep an appointment. If you give yourself this medication at home: If you miss a dose, take it as soon as you can. If it is almost time for your next dose, take only that dose. Do not take double or extra doses. Call your care team with questions. What may interact with this medication? Do not take this medication with any of the following: Abatacept Anakinra Biologic medications, such as certolizumab, etanercept, golimumab, infliximab Live virus vaccines This medication may also interact with the following: Cyclosporine Theophylline Vaccines Warfarin This list may not describe all possible interactions. Give your health care provider a list of all the medicines, herbs, non-prescription drugs, or dietary supplements you use. Also tell them if you smoke, drink alcohol, or use illegal drugs. Some items may interact with your medicine. What should I watch for while using this medication? Visit your care team for regular checks on your progress. Tell your care team if your symptoms do not start to get better or if they get worse. You will be tested for tuberculosis (TB) before you start this medication. If your care team prescribes any medication for TB, you should start taking the TB medication before starting this medication. Make sure to finish the full course of TB medication. This medication may increase  your risk of getting an infection. Call your care team for advice if you get a fever, chills, sore throat, or other symptoms of a cold or flu. Do not treat yourself. Try to avoid being around people who are sick. Talk to your care team about your risk of cancer. You may be more at risk for certain types of cancer if you take this medication. What side effects may I notice from receiving this medication? Side effects that you should report to your care team as soon as possible: Allergic reactions--skin rash, itching, hives, swelling of the face, lips, tongue, or throat Aplastic anemia--unusual weakness or fatigue, dizziness, headache, trouble breathing, increased bleeding or bruising Body pain, tingling, or numbness Heart failure--shortness of breath, swelling of the ankles, feet, or hands, sudden weight gain, unusual weakness or fatigue Infection--fever, chills, cough, sore throat, wounds that don't heal, pain or trouble when passing urine, general feeling of discomfort or being unwell Lupus-like syndrome--joint pain, swelling, or stiffness, butterfly-shaped rash on the face, rashes that get worse in the sun, fever, unusual weakness or fatigue Unusual bruising or bleeding Side effects that usually do not require  medical attention (report to your care team if they continue or are bothersome): Headache Nausea Pain, redness, or irritation at injection site Runny or stuffy nose Sore throat Stomach pain This list may not describe all possible side effects. Call your doctor for medical advice about side effects. You may report side effects to FDA at 1-800-FDA-1088. Where should I keep my medication? Keep out of the reach of children and pets. Store in the refrigerator. Do not freeze. Keep this medication in the original packaging until you are ready to take it. Protect from light. Get rid of any unused medication after the expiration date. This medication may be stored at room temperature for up to  14 days. Keep this medication in the original packaging. Protect from light. If it is stored at room temperature, get rid of any unused medication after 14 days or after it expires, whichever is first. To get rid of medications that are no longer needed or have expired: Take the medication to a medication take-back program. Check with your pharmacy or law enforcement to find a location. If you cannot return the medication, ask your pharmacist or care team how to get rid of this medication safely. NOTE: This sheet is a summary. It may not cover all possible information. If you have questions about this medicine, talk to your doctor, pharmacist, or health care provider.  2024 Elsevier/Gold Standard (2021-07-02 00:00:00)

## 2022-12-27 NOTE — Telephone Encounter (Addendum)
Pending OV note from today ,please start Humira BIV  Dose: 40mg  SQ every 14 days  Will be able to start Humira when she is 12 weeks past her last Stelara dose  [name] [name], PharmD, MPH, BCPS, CPP Clinical Pharmacist (Rheumatology and Pulmonology)  ----- Message from [facility] Marissa G sent at 12/27/2022  3:41 PM EDT ----- Please apply for Humira, per Sherron Ales, PA-C. Thanks!   Consent obtained and sent to the scan center.

## 2022-12-28 LAB — COMPLETE METABOLIC PANEL WITH GFR
AG Ratio: 2.2 (calc) (ref 1.0–2.5)
ALT: 35 U/L — ABNORMAL HIGH (ref 6–29)
AST: 26 U/L (ref 10–35)
Albumin: 4.6 g/dL (ref 3.6–5.1)
Alkaline phosphatase (APISO): 42 U/L (ref 31–125)
BUN/Creatinine Ratio: 18 (calc) (ref 6–22)
BUN: 19 mg/dL (ref 7–25)
CO2: 26 mmol/L (ref 20–32)
Calcium: 9.4 mg/dL (ref 8.6–10.2)
Chloride: 105 mmol/L (ref 98–110)
Creat: 1.04 mg/dL — ABNORMAL HIGH (ref 0.50–0.99)
Globulin: 2.1 g/dL (ref 1.9–3.7)
Glucose, Bld: 80 mg/dL (ref 65–99)
Potassium: 4.6 mmol/L (ref 3.5–5.3)
Sodium: 139 mmol/L (ref 135–146)
Total Bilirubin: 0.4 mg/dL (ref 0.2–1.2)
Total Protein: 6.7 g/dL (ref 6.1–8.1)
eGFR: 68 mL/min/{1.73_m2} (ref >=60)

## 2022-12-28 LAB — CBC WITH DIFFERENTIAL/PLATELET
Absolute Monocytes: 828 cells/uL (ref 200–950)
Basophils Absolute: 46 {cells}/uL (ref 0–200)
Basophils Relative: 0.5 %
Eosinophils Absolute: 304 cells/uL (ref 15–500)
Eosinophils Relative: 3.3 %
HCT: 37.8 % (ref 35.0–45.0)
Hemoglobin: 12.3 g/dL (ref 11.7–15.5)
Lymphs Abs: 2217 cells/uL (ref 850–3900)
MCH: 29 pg (ref 27.0–33.0)
MCHC: 32.5 g/dL (ref 32.0–36.0)
MCV: 89.2 fL (ref 80.0–100.0)
MPV: 9.9 fL (ref 7.5–12.5)
Monocytes Relative: 9 %
Neutro Abs: 5805 {cells}/uL (ref 1500–7800)
Neutrophils Relative %: 63.1 %
Platelets: 208 10*3/uL (ref 140–400)
RBC: 4.24 10*6/uL (ref 3.80–5.10)
RDW: 12.3 % (ref 11.0–15.0)
Total Lymphocyte: 24.1 %
WBC: 9.2 10*3/uL (ref 3.8–10.8)

## 2022-12-28 NOTE — Progress Notes (Signed)
CBC WNL ALT remains borderline elevated-35. AST WNL. Creatinine is borderline elevated-1.04. rest of CMP WNL. Avoid the use of NSAIDs and remain hydrated.

## 2022-12-30 ENCOUNTER — Encounter: Payer: Self-pay | Admitting: *Deleted

## 2022-12-30 NOTE — Telephone Encounter (Signed)
Submitted a Prior Authorization request to [facility] for  ADALIMUMAB-ADAZ  via CoverMyMeds. Will update once we receive a response.  Key: [id number]  Humira is not preferred option for her insurance. Will need to discuss with patient when scheduling new start visit  Chesley Mires, PharmD, MPH, BCPS, CPP Clinical Pharmacist (Rheumatology and Pulmonology)

## 2023-01-04 NOTE — Telephone Encounter (Signed)
Received notification from [facility] regarding a prior authorization for  ADALIMUMAB-ADAZ . Authorization has been APPROVED from 12/30/2022 to 12/30/2023. Approval letter sent to scan center.  Unable to run test claim because patient must fill through [facility]: [phone]  Authorization # [id number]  Enrolled patient into copay card: Billing on left side is for adalimumab-adaz Billing on right side if for Hyrimoz (brand)   Last Stelara dose was 11/30/22. She can start adalimumab-adaz on or after 02/22/23  Chesley Mires, PharmD, MPH, BCPS, CPP Clinical Pharmacist (Rheumatology and Pulmonology)

## 2023-01-09 ENCOUNTER — Other Ambulatory Visit: Payer: Self-pay | Admitting: Physician Assistant

## 2023-01-09 DIAGNOSIS — L4050 Arthropathic psoriasis, unspecified: Secondary | ICD-10-CM

## 2023-01-09 DIAGNOSIS — L409 Psoriasis, unspecified: Secondary | ICD-10-CM

## 2023-01-10 NOTE — Telephone Encounter (Signed)
Contacted patient to inform her that insurance prefers Hyrimoz over Humira. She was informed that Hyrimoz is a biosimilar of Humira. She agreed to come into the office for a new-start Hyrimoz visit. She was advised to call the office to reschedule in the event that she develops an infection or requires a major procedure/surgery the week of the visit.   Sofie Rower, PharmD, [employer] PGY1

## 2023-01-27 ENCOUNTER — Other Ambulatory Visit: Payer: Self-pay | Admitting: Family

## 2023-02-22 ENCOUNTER — Ambulatory Visit: Payer: 59 | Attending: Rheumatology | Admitting: Pharmacist

## 2023-02-22 DIAGNOSIS — L4050 Arthropathic psoriasis, unspecified: Secondary | ICD-10-CM

## 2023-02-22 DIAGNOSIS — L409 Psoriasis, unspecified: Secondary | ICD-10-CM

## 2023-02-22 DIAGNOSIS — Z7189 Other specified counseling: Secondary | ICD-10-CM

## 2023-02-22 DIAGNOSIS — Z79899 Other long term (current) drug therapy: Secondary | ICD-10-CM

## 2023-02-22 MED ORDER — ADALIMUMAB-ADAZ 40 MG/0.4ML ~~LOC~~ SOAJ: 40.0000 mg | SUBCUTANEOUS | 1 refills | Status: DC

## 2023-02-22 NOTE — Progress Notes (Signed)
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 She is switching from Stelara (last dose was 11/29/2022 which her husband was administering).  Patient running a fever or have signs/symptoms of infection? No  Patient currently on antibiotics for the treatment of infection? No  Patient have any upcoming invasive procedures/surgeries? No  Objective: CMP     Component Value Date/Time   NA 139 12/27/2022 1416   K 4.6 12/27/2022 1416   CL 105 12/27/2022 1416   CO2 26 12/27/2022 1416   GLUCOSE 80 12/27/2022 1416   GLUCOSE 94 05/05/2006 1333   BUN 19 12/27/2022 1416   CREATININE 1.04 (H) 12/27/2022 1416   CALCIUM 9.4 12/27/2022 1416   PROT 6.7 12/27/2022 1416   ALBUMIN 4.3 07/10/2019 1352   AST 26 12/27/2022 1416   ALT 35 (H) 12/27/2022 1416   ALKPHOS 40 07/10/2019 1352   BILITOT 0.4 12/27/2022 1416   GFRNONAA >60 10/05/2008 2240   GFRAA  10/05/2008 2240    >60        The eGFR has been calculated using the MDRD equation. This calculation has not been validated in all clinical situations. eGFR's persistently <60 mL/min signify possible Chronic Kidney Disease.    CBC    Component Value Date/Time   WBC 9.2 12/27/2022 1416   RBC 4.24 12/27/2022 1416   HGB 12.3 12/27/2022 1416   HCT 37.8 12/27/2022 1416   PLT 208 12/27/2022 1416   MCV 89.2 12/27/2022 1416   MCV 91.3 01/15/2012 1337   MCH 29.0 12/27/2022 1416   MCHC 32.5 12/27/2022 1416   RDW 12.3 12/27/2022 1416   LYMPHSABS 2,217 12/27/2022 1416   MONOABS 0.6 07/10/2019 1352   EOSABS 304 12/27/2022 1416   BASOSABS 46 12/27/2022 1416    Baseline Immunosuppressant Therapy Labs TB GOLD    Latest Ref Rng & Units 04/11/2022    9:44 AM  Quantiferon TB Gold  Quantiferon TB Gold Plus NEGATIVE NEGATIVE    Hepatitis Panel    Latest Ref Rng & Units 04/11/2022    9:44 AM  Hepatitis  Hep B Surface Ag NON-REACTIVE NON-REACTIVE   Hep B IgM NON-REACTIVE  NON-REACTIVE   Hep C Ab NON-REACTIVE NON-REACTIVE    HIV No results found for: "HIV" Immunoglobulins    Latest Ref Rng & Units 04/11/2022    9:44 AM  Immunoglobulin Electrophoresis  IgA  47 - 310 mg/dL 85   IgG 106 - 2,694 mg/dL 854   IgM 50 - 627 mg/dL 035    SPEP    Latest Ref Rng & Units 12/27/2022    2:16 PM  Serum Protein Electrophoresis  Total Protein 6.1 - 8.1 g/dL 6.7    Chest x-ray: 0/0/9381 ([facility]where) - Normal cardiac size.  No acute abnormality  Assessment/Plan:  Reviewed importance of holding Hyrimoz with signs/symptoms of an infections, if antibiotics are prescribed to treat an active infection, and with invasive procedures  Demonstrated proper injection technique with Hyrimoz demo device  Patient able to demonstrate proper injection technique using the teach back method.  Patient self injected in the right lower abdomen with:  Sample Medication: Hyrimoz 40mg /0.59mL autoinjector NDC: [address]-67 Lot: [id number] Expiration: 05/2023  Patient tolerated well.  Observed for 30 mins in office for adverse reaction. Patient denies itchiness and irritation at injection., No swelling or redness noted., and Reviewed injection site reaction management with patient verbally and printed information for review in AVS  Patient is  to return in 1 month for labs and 6-8 weeks for follow-up appointment.  Standing orders for CBC/CMP remain in place.  TB gold will be monitored yearly. Patient sees Dr. Emily Filbert with [facility] - recommended yearly skin checks while on TNF inhibitor due to risk for non melanoma skin cancer  Hyrimoz approved through insurance .   Rx sent to: [facility]: [phone].  Patient provided with pharmacy phone number and advised to call later this week to schedule shipment to home.  Patient will continue Hyrimoz 40mg  subcut every 14 days.  All questions encouraged and answered.  Instructed patient to call with any further questions or  concerns.  Chesley Mires, PharmD, MPH, BCPS, CPP Clinical Pharmacist (Rheumatology and Pulmonology)  02/22/2023 8:02 AM

## 2023-02-22 NOTE — Patient Instructions (Addendum)
Your next  HYRIMOZ  dose is due on 03/08/2023, 03/22/2023, and every 14 days thereafter  HOLD  HYRIMOZ  if you have signs or symptoms of an infection. You can resume once you feel better or back to your baseline. HOLD HYRIMOZ if you start antibiotics to treat an infection. HOLD HYRIMOZ around the time of surgery/procedures. Your surgeon will be able to provide recommendations on when to hold BEFORE and when you are cleared to RESUME.  Pharmacy information: Your prescription will be shipped from [facility]. Their phone number is [phone] Please call to schedule shipment and confirm address. They will mail your medication to your home.  Cost information: Your copay should be affordable. If you call the pharmacy and it is not affordable, please double-check that they are billing through your copay card as secondary coverage. That copay card information is: Billing on left side is for adalimumab-adaz Billing on right side if for Hyrimoz (brand)   Labs are due in 1 month then every 3 months. Lab hours are from Monday to Thursday 8am-12:30pm and 1pm-5pm and Friday 8am-12pm. You do not need an appointment if you come for labs during these times. If you'd like to go to a [facility] or [facility] closer to home, please call our clinic 48 hours prior to lab date so we can release orders in a timely manner.  How to manage an injection site reaction: Remember the 5 C's: COUNTER - leave on the counter at least 30 minutes but up to overnight to bring medication to room temperature. This may help prevent stinging COLD - place something cold (like an ice gel pack or cold water bottle) on the injection site just before cleansing with alcohol. This may help reduce pain CLARITIN - use Claritin (generic name is loratadine) for the first two weeks of treatment or the day of, the day before, and the day after injecting. This will help to minimize injection site reactions CORTISONE CREAM - apply if  injection site is irritated and itching CALL ME - if injection site reaction is bigger than the size of your fist, looks infected, blisters, or if you develop hives

## 2023-03-22 ENCOUNTER — Ambulatory Visit: Payer: 59 | Admitting: Physician Assistant

## 2023-03-23 ENCOUNTER — Other Ambulatory Visit: Payer: Self-pay | Admitting: *Deleted

## 2023-03-23 DIAGNOSIS — Z79899 Other long term (current) drug therapy: Secondary | ICD-10-CM

## 2023-03-23 DIAGNOSIS — L409 Psoriasis, unspecified: Secondary | ICD-10-CM

## 2023-03-23 DIAGNOSIS — L4050 Arthropathic psoriasis, unspecified: Secondary | ICD-10-CM

## 2023-03-24 NOTE — Progress Notes (Signed)
CBC and CMP normal

## 2023-03-26 LAB — QUANTIFERON-TB GOLD PLUS
Mitogen-NIL: >10 [IU]/mL
NIL: 0.03 [IU]/mL
QuantiFERON-TB Gold Plus: NEGATIVE
TB1-NIL: 0 [IU]/mL
TB2-NIL: 0 [IU]/mL

## 2023-03-26 LAB — COMPLETE METABOLIC PANEL WITH GFR
AG Ratio: 2.2 (calc) (ref 1.0–2.5)
ALT: 29 U/L (ref 6–29)
AST: 21 U/L (ref 10–35)
Albumin: 4.8 g/dL (ref 3.6–5.1)
Alkaline phosphatase (APISO): 48 U/L (ref 31–125)
BUN: 18 mg/dL (ref 7–25)
CO2: 23 mmol/L (ref 20–32)
Calcium: 9.3 mg/dL (ref 8.6–10.2)
Chloride: 103 mmol/L (ref 98–110)
Creat: 0.78 mg/dL (ref 0.50–0.99)
Globulin: 2.2 g/dL (ref 1.9–3.7)
Glucose, Bld: 86 mg/dL (ref 65–99)
Potassium: 4.1 mmol/L (ref 3.5–5.3)
Sodium: 138 mmol/L (ref 135–146)
Total Bilirubin: 0.4 mg/dL (ref 0.2–1.2)
Total Protein: 7 g/dL (ref 6.1–8.1)
eGFR: 95 mL/min/{1.73_m2} (ref >=60)

## 2023-03-26 LAB — CBC WITH DIFFERENTIAL/PLATELET
Absolute Lymphocytes: 2537 {cells}/uL (ref 850–3900)
Absolute Monocytes: 748 {cells}/uL (ref 200–950)
Basophils Absolute: 50 {cells}/uL (ref 0–200)
Basophils Relative: 0.6 %
Eosinophils Absolute: 286 {cells}/uL (ref 15–500)
Eosinophils Relative: 3.4 %
HCT: 40.6 % (ref 35.0–45.0)
Hemoglobin: 13.2 g/dL (ref 11.7–15.5)
MCH: 28.7 pg (ref 27.0–33.0)
MCHC: 32.5 g/dL (ref 32.0–36.0)
MCV: 88.3 fL (ref 80.0–100.0)
MPV: 10.6 fL (ref 7.5–12.5)
Monocytes Relative: 8.9 %
Neutro Abs: 4780 {cells}/uL (ref 1500–7800)
Neutrophils Relative %: 56.9 %
Platelets: 202 10*3/uL (ref 140–400)
RBC: 4.6 10*6/uL (ref 3.80–5.10)
RDW: 12.9 % (ref 11.0–15.0)
Total Lymphocyte: 30.2 %
WBC: 8.4 10*3/uL (ref 3.8–10.8)

## 2023-03-26 NOTE — Progress Notes (Signed)
TB Gold is negative.

## 2023-04-03 NOTE — Progress Notes (Unsigned)
Office Visit Note  Patient: [name] [name]             Date of Birth: Jan 10, 1976           MRN: [id number]             PCP: Albertina Parr Referring: Sandford Craze, NP Visit Date: 04/17/2023 Occupation: @GUAROCC @  Subjective:  Medication monitoring   History of Present Illness: [name] [name] is a 47 y.o. female with history of psoriatic arthritis.  Patient was started on Hyrimoz on 02/22/2023.  She is tolerating hyrimoz without any side effects or injection site reactions.  She has noticed about a 30% improvement in her symptoms since initiating therapy.  Patient states that the tenderness in her hands has started to improve gradually.  She denies any Achilles tendinitis or plantar fasciitis.  She denies any SI joint pain.  She continues to have some discomfort in her elbows especially if hitting them on hard objects.  She denies any joint swelling at this time.  Patient continues to have some scalp psoriasis and has been using clobetasol external solution topically.  She requested a refill of clobetasol solution to be sent to the pharmacy today. She denies any recent or recurrent infections.    Activities of Daily Living:  Patient reports morning stiffness for 0 minutes.   Patient Denies nocturnal pain.  Difficulty dressing/grooming: Denies Difficulty climbing stairs: Denies Difficulty getting out of chair: Denies Difficulty using hands for taps, buttons, cutlery, and/or writing: Denies  Review of Systems  Constitutional:  Negative for fatigue.  HENT:  Negative for mouth sores, mouth dryness and nose dryness.   Eyes:  Negative for pain and dryness.  Respiratory:  Negative for cough, shortness of breath and wheezing.   Cardiovascular:  Negative for chest pain and palpitations.  Gastrointestinal:  Negative for blood in stool, constipation and diarrhea.  Endocrine: Negative for increased urination.  Genitourinary:  Negative for involuntary urination.  Musculoskeletal:   Positive for joint pain, joint pain and joint swelling. Negative for gait problem, myalgias, muscle weakness, morning stiffness, muscle tenderness and myalgias.  Skin:  Negative for color change, rash, hair loss and sensitivity to sunlight.  Allergic/Immunologic: Negative for susceptible to infections.  Neurological:  Negative for dizziness and headaches.  Hematological:  Negative for swollen glands.  Psychiatric/Behavioral:  Positive for sleep disturbance. Negative for depressed mood. The patient is not nervous/anxious.     PMFS History:  Patient Active Problem List   Diagnosis Date Noted   Weight gain 12/20/2022   GERD (gastroesophageal reflux disease) 05/03/2022   DDD (degenerative disc disease), lumbar 04/11/2022   Lateral epicondylitis, right elbow 04/27/2021   CMC (carpometacarpal) synovitis 02/15/2021   Lateral epicondylitis of left elbow 02/15/2021   Shoulder impingement syndrome, right 02/15/2021   Preventative health care 02/02/2021   Arthralgia 02/02/2021   Thumb injury, right, initial encounter 11/08/2017   Sciatica 07/10/2014   Low back pain 05/10/2012   UNSPECIFIED MYALGIA AND MYOSITIS 04/28/2008   Anxiety and depression 09/08/2006   Depressive disorder, not elsewhere classified 09/08/2006   Allergic rhinitis 09/06/2006   INSOMNIA 09/06/2006    Past Medical History:  Diagnosis Date   Allergy    Anal fissure    Anxiety    Arthritis    Depression    External hemorrhoids     Family History  Problem Relation Age of Onset   Colitis Mother    Basal cell carcinoma Mother    Arthritis Mother  Breast cancer Mother    Crohn's disease Father        questionable diagnosis   Protein C deficiency Sister    Deep vein thrombosis Sister    Pulmonary embolism Sister    Other Sister        pre-diabetes   Other Sister        pre-diabetes   Ankylosing spondylitis Sister    Colitis Sister    Other Brother        pre-diabetes   Heart disease Paternal Grandmother     Colon cancer Neg Hx    Cancer Neg Hx    Drug abuse Neg Hx    Stroke Neg Hx    Past Surgical History:  Procedure Laterality Date   BREAST BIOPSY Right 06/20/2022   MM RT BREAST BX W LOC DEV 1ST LESION IMAGE BX SPEC STEREO GUIDE 06/20/2022 [facility]   COLONOSCOPY N/A 12/14/2012   Procedure: COLONOSCOPY;  Surgeon: Hart Carwin, MD;  Location: [facility];  Service: Endoscopy;  Laterality: N/A;   ORIF FINGER / THUMB FRACTURE Right    WISDOM TOOTH EXTRACTION     Social History   Social History Narrative   Works at [employer] with fiance   No children    Immunization History  Administered Date(s) Administered   Influenza,inj,Quad PF,6+ Mos 03/27/2017, 04/03/2018, 02/26/2019, 02/02/2021   Moderna Sars-Covid-2 Vaccination 11/13/2019, 12/11/2019   Td 06/29/2009, 11/06/2017     Objective: Vital Signs: BP 126/86 (BP Location: Left Arm, Patient Position: Sitting, Cuff Size: Normal)   Pulse 74   Resp 15   Ht 5' 3.5" (1.613 m)   Wt 152 lb 3.2 oz (69 kg)   BMI 26.54 kg/m    Physical Exam Vitals and nursing note reviewed.  Constitutional:      Appearance: She is well-developed.  HENT:     Head: Normocephalic and atraumatic.  Eyes:     Conjunctiva/sclera: Conjunctivae normal.  Cardiovascular:     Rate and Rhythm: Normal rate and regular rhythm.     Heart sounds: Normal heart sounds.  Pulmonary:     Effort: Pulmonary effort is normal.     Breath sounds: Normal breath sounds.  Abdominal:     General: Bowel sounds are normal.     Palpations: Abdomen is soft.  Musculoskeletal:     Cervical back: Normal range of motion.  Lymphadenopathy:     Cervical: No cervical adenopathy.  Skin:    General: Skin is warm and dry.     Capillary Refill: Capillary refill takes less than 2 seconds.  Neurological:     Mental Status: She is alert and oriented to person, place, and time.  Psychiatric:        Behavior: Behavior normal.      Musculoskeletal Exam: C-spine,  thoracic spine, lumbar spine have good range of motion.  No midline spinal tenderness.  No SI joint tenderness.  Shoulder joints, elbow joints, wrist joints, MCPs, PIPs, DIPs have good range of motion with no synovitis.  Tenderness over the right first PIP and the right fifth PIP joint.  Complete fist formation bilaterally.  Hip joints have good range of motion with no groin pain.  Knee joints have good range of motion no warmth or effusion.  Ankle joints have good range of motion with no tenderness or joint swelling.  No evidence of Achilles tendinitis or plantar fasciitis.  CDAI Exam: CDAI Score: -- Patient Global: --; Provider Global: -- Swollen: --; Tender: --  Joint Exam 04/17/2023   No joint exam has been documented for this visit   There is currently no information documented on the homunculus. Go to the Rheumatology activity and complete the homunculus joint exam.  Investigation: No additional findings.  Imaging: No results found.  Recent Labs: Lab Results  Component Value Date   WBC 8.4 03/23/2023   HGB 13.2 03/23/2023   PLT 202 03/23/2023   NA 138 03/23/2023   K 4.1 03/23/2023   CL 103 03/23/2023   CO2 23 03/23/2023   GLUCOSE 86 03/23/2023   BUN 18 03/23/2023   CREATININE 0.78 03/23/2023   BILITOT 0.4 03/23/2023   ALKPHOS 40 07/10/2019   AST 21 03/23/2023   ALT 29 03/23/2023   PROT 7.0 03/23/2023   ALBUMIN 4.3 07/10/2019   CALCIUM 9.3 03/23/2023   GFRAA  10/05/2008    >60        The eGFR has been calculated using the MDRD equation. This calculation has not been validated in all clinical situations. eGFR's persistently <60 mL/min signify possible Chronic Kidney Disease.   QFTBGOLDPLUS NEGATIVE 03/23/2023    Speciality Comments: No specialty comments available.  Procedures:  No procedures performed Allergies: Patient has no known allergies.   Assessment / Plan:     Visit Diagnoses: Psoriatic arthritis (HCC) - History of inflammatory arthritis.  Pain in  right shoulder, bilateral lateral epicondylitis, left second MCP synovitis, chronic SI joint pain: No synovitis or dactylitis on examination today.  She has noticed about a 30% improvement in her symptoms since initiating Hyrimoz on 02/22/2023.  She is tolerating Hyrimoz without any side effects or injection site reactions.  No recent or recurrent infections.  No gaps in therapy.  She continues to have patches of psoriasis on her scalp and has been using clobetasol external solution which has been helpful.  She has no SI joint tenderness upon palpation today.  No evidence of Achilles tendinitis or plantar fasciitis.  She has had less tenderness in her hands and elbows since initiating Hyrimoz.  No medication changes will be made at this time.  She was advised to notify us if she develops signs or symptoms of a flare.  She will follow-up in the office in 3 months or sooner if needed.  Psoriasis: Scalp--she has been using clobetasol external solution topically for relief.  A refill will be sent to the pharmacy today.  She will remain on Hyrimoz as prescribed.  She is advised to notify us if she develops any new or worsening of patches of psoriasis.   High risk medication use - Hyrimoz 40 mg sq injections every 14 days. Inadequate response to Stelara 45 mg sq injections every 12 weeks--initiated Stelara on 05/18/22--last dose 11/30/22. CBC and CMP updated on 03/23/23. Her next lab work will be due in February and every 3 months.  Standing orders for CBC and CMP were placed today. Inadequate response to Stelara.  TB gold negative on 03/23/23.  No recent or recurrent infections. Discussed the importance of holding hyrimoz if she develops signs or symptoms of an infection and to resume once the infection has completely cleared.    - Plan: CBC with Differential/Platelet, COMPLETE METABOLIC PANEL WITH GFR  HLA B27 positive  Shoulder impingement syndrome, right: She has good range of motion of the right shoulder  joint on examination today.  No tenderness or discomfort at this time.  Pain in both hands: She has some residual tenderness in the right fifth PIP and the right first  PIP joint.  No synovitis or dactylitis noted on examination today.  She has started to notice improvement in the amount of joint tenderness she experiences in her hands since initiating Hyrimoz.  Lateral epicondylitis of both elbows: Improved.  No tenderness upon palpation.  Degeneration of intervertebral disc of lumbar region without discogenic back pain or lower extremity pain: No midline spinal tenderness.  No symptoms of radiculopathy.  Chronic SI joint pain: No SI joint tenderness upon palpation today.  Pain in both feet: No evidence of Achilles tendinitis or plantar fasciitis.  Good range of motion of both ankle joints with no tenderness or synovitis.  Other medical conditions are listed as follows:  Collagenous colitis - Colonoscopy by Dr. Dickie La in the past and was diagnosed with collagenous colitis. Patient states she was treated with Lialda in the past.  Taking Align PRN.  Other fatigue  Anxiety and depression  Adjustment insomnia  Family history of ankylosing spondylitis-Sister  Family history of ulcerative colitis-mother  Family history of Crohn's disease-father  Orders: Orders Placed This Encounter  Procedures   CBC with Differential/Platelet   COMPLETE METABOLIC PANEL WITH GFR     Follow-Up Instructions: Return in about 3 months (around 07/16/2023) for Psoriatic arthritis.   Gearldine Bienenstock, PA-C  Note - This record has been created using Dragon software.  Chart creation errors have been sought, but may not always  have been located. Such creation errors do not reflect on  the standard of medical care.

## 2023-04-04 ENCOUNTER — Other Ambulatory Visit: Payer: Self-pay | Admitting: Physician Assistant

## 2023-04-04 DIAGNOSIS — Z79899 Other long term (current) drug therapy: Secondary | ICD-10-CM

## 2023-04-04 DIAGNOSIS — L4050 Arthropathic psoriasis, unspecified: Secondary | ICD-10-CM

## 2023-04-04 DIAGNOSIS — L409 Psoriasis, unspecified: Secondary | ICD-10-CM

## 2023-04-04 NOTE — Telephone Encounter (Signed)
Last Fill: 02/22/2023  Labs: 03/23/2023 CBC and CMP normal.   TB Gold: 03/23/2023 Neg    Next Visit: 04/17/2023  Last Visit: 12/27/2022  [id number] arthritis   Current Dose per office note 12/27/2022: Humira 40 mg every 14 days   Okay to refill Hyrimoz?

## 2023-04-17 ENCOUNTER — Ambulatory Visit: Payer: 59 | Attending: Physician Assistant | Admitting: Physician Assistant

## 2023-04-17 ENCOUNTER — Encounter: Payer: Self-pay | Admitting: Physician Assistant

## 2023-04-17 VITALS — BP 126/86 | HR 74 | Resp 15 | Ht 63.5 in | Wt 152.2 lb

## 2023-04-17 DIAGNOSIS — M79671 Pain in right foot: Secondary | ICD-10-CM

## 2023-04-17 DIAGNOSIS — Z1589 Genetic susceptibility to other disease: Secondary | ICD-10-CM

## 2023-04-17 DIAGNOSIS — Z79899 Other long term (current) drug therapy: Secondary | ICD-10-CM | POA: Diagnosis not present

## 2023-04-17 DIAGNOSIS — L4050 Arthropathic psoriasis, unspecified: Secondary | ICD-10-CM | POA: Diagnosis not present

## 2023-04-17 DIAGNOSIS — K52831 Collagenous colitis: Secondary | ICD-10-CM

## 2023-04-17 DIAGNOSIS — F5102 Adjustment insomnia: Secondary | ICD-10-CM

## 2023-04-17 DIAGNOSIS — M79672 Pain in left foot: Secondary | ICD-10-CM

## 2023-04-17 DIAGNOSIS — Z8379 Family history of other diseases of the digestive system: Secondary | ICD-10-CM

## 2023-04-17 DIAGNOSIS — M533 Sacrococcygeal disorders, not elsewhere classified: Secondary | ICD-10-CM

## 2023-04-17 DIAGNOSIS — L409 Psoriasis, unspecified: Secondary | ICD-10-CM

## 2023-04-17 DIAGNOSIS — R5383 Other fatigue: Secondary | ICD-10-CM

## 2023-04-17 DIAGNOSIS — F419 Anxiety disorder, unspecified: Secondary | ICD-10-CM

## 2023-04-17 DIAGNOSIS — M79641 Pain in right hand: Secondary | ICD-10-CM

## 2023-04-17 DIAGNOSIS — M7712 Lateral epicondylitis, left elbow: Secondary | ICD-10-CM

## 2023-04-17 DIAGNOSIS — M7541 Impingement syndrome of right shoulder: Secondary | ICD-10-CM

## 2023-04-17 DIAGNOSIS — M79642 Pain in left hand: Secondary | ICD-10-CM

## 2023-04-17 DIAGNOSIS — M51369 Other intervertebral disc degeneration, lumbar region without mention of lumbar back pain or lower extremity pain: Secondary | ICD-10-CM

## 2023-04-17 DIAGNOSIS — F32A Depression, unspecified: Secondary | ICD-10-CM

## 2023-04-17 DIAGNOSIS — Z8269 Family history of other diseases of the musculoskeletal system and connective tissue: Secondary | ICD-10-CM

## 2023-04-17 DIAGNOSIS — G8929 Other chronic pain: Secondary | ICD-10-CM

## 2023-04-17 DIAGNOSIS — M7711 Lateral epicondylitis, right elbow: Secondary | ICD-10-CM

## 2023-04-17 MED ORDER — CLOBETASOL PROPIONATE 0.05 % EX SOLN: 1.0000 | Freq: Every day | CUTANEOUS | 1 refills | Status: AC | PRN

## 2023-04-17 NOTE — Addendum Note (Signed)
Addended by: Gearldine Bienenstock on: 04/17/2023 03:43 PM   Modules accepted: Orders

## 2023-04-17 NOTE — Patient Instructions (Signed)

## 2023-04-25 ENCOUNTER — Other Ambulatory Visit: Payer: Self-pay | Admitting: Family

## 2023-05-06 ENCOUNTER — Other Ambulatory Visit: Payer: Self-pay | Admitting: Family

## 2023-06-13 ENCOUNTER — Telehealth: Payer: Self-pay | Admitting: Nurse Practitioner

## 2023-06-13 ENCOUNTER — Other Ambulatory Visit: Payer: Self-pay

## 2023-06-13 DIAGNOSIS — Z1211 Encounter for screening for malignant neoplasm of colon: Secondary | ICD-10-CM

## 2023-06-13 NOTE — Telephone Encounter (Signed)
MO referred.Marland KitchenMarland Kitchen

## 2023-06-13 NOTE — Telephone Encounter (Signed)
Copied from CRM [id number]. Topic: Referral - Question >> Jun 13, 2023  3:18 PM Denese Killings wrote: Reason for CRM: Patient stated that she did receive a call from Gastroenterology some time ago and she was busy so she couldn't speak with them. She states that they never reached back out to her. Patient wants to know if she has to get another order for colonoscopy or be referred again.

## 2023-06-27 ENCOUNTER — Encounter: Payer: Self-pay | Admitting: Gastroenterology

## 2023-06-28 ENCOUNTER — Encounter: Payer: Self-pay | Admitting: Sports Medicine

## 2023-06-28 ENCOUNTER — Ambulatory Visit (INDEPENDENT_AMBULATORY_CARE_PROVIDER_SITE_OTHER): Payer: 59 | Admitting: Sports Medicine

## 2023-06-28 VITALS — BP 120/80 | Ht 63.5 in | Wt 152.0 lb

## 2023-06-28 DIAGNOSIS — M7541 Impingement syndrome of right shoulder: Secondary | ICD-10-CM | POA: Diagnosis not present

## 2023-06-28 MED ORDER — METHYLPREDNISOLONE ACETATE 40 MG/ML IJ SUSP
40.0000 mg | Freq: Once | INTRAMUSCULAR | Status: AC
  Administered 2023-06-28: 40 mg via INTRA_ARTICULAR

## 2023-06-28 NOTE — Progress Notes (Signed)
   Subjective:    Patient ID: [name] [name], female    DOB: [birth year], 48 y.o.   MRN: [id number]  HPI chief complaint: Right shoulder pain  Patient is a very pleasant right-hand-dominant 48 year old female who presents today with a couple years of persistent and worsening right shoulder pain.  Pain is primarily along the lateral shoulder and worse at night.  She does have some discomfort during the day such as with using the mouse on the computer but otherwise her symptoms are minimal.  She has a history of psoriatic arthritis but does not believe that that is contributing to her shoulder pain.  No prior shoulder surgeries.  Per her report, she has undergone a previous shoulder ultrasound which showed no significant injury to the shoulder.  Past medical history reviewed Medications reviewed Allergies reviewed    Review of Systems As above    Objective:   Physical Exam  Well-developed, well-nourished.  No acute distress  Right shoulder: Full range of motion.  She does have pain with internal rotation.  No tenderness to palpation.  Mildly positive empty can, mildly positive Hawkins.  Rotator cuff strength is 5/5.  No atrophy.  Neurovascularly intact distally.      Assessment & Plan:   Right shoulder pain secondary to rotator cuff impingement  Patient's right shoulder was injected with cortisone today.  She tolerates this without difficulty.  She is given a home Jobe exercise program and understands importance of doing these exercises daily.  If symptoms persist consider merits of formal physical therapy versus MRI.  Follow-up for ongoing or recalcitrant issues.  Consent obtained and verified. Time-out conducted. Noted no overlying erythema, induration, or other signs of local infection. Skin prepped in a sterile fashion. Topical analgesic spray: Ethyl chloride. Joint: Right shoulder, subacromial space Needle: 25 gauge 1.5 inch Completed without difficulty. Meds: 3 cc 1%  Xylocaine, 1 cc (40 mg) Depo-Medrol  This note was dictated using Dragon naturally speaking software and may contain errors in syntax, spelling, or content which have not been identified prior to signing this note.

## 2023-07-03 ENCOUNTER — Other Ambulatory Visit: Payer: Self-pay | Admitting: Obstetrics and Gynecology

## 2023-07-03 DIAGNOSIS — Z1231 Encounter for screening mammogram for malignant neoplasm of breast: Secondary | ICD-10-CM

## 2023-07-03 NOTE — Progress Notes (Signed)
 Office Visit Note  Patient: [name] [name]             Date of Birth: 03-09-1976           MRN: [id number]             PCP: Sandford Craze, NP Referring: Albertina Parr, NP Visit Date: 07/17/2023 Occupation: @GUAROCC @  Subjective:  Right shoulder pain  History of Present Illness: [name] [name] is a 48 y.o. female with history of psoriatic arthritis.  Patient remains on hyrimoz 40 mg sq injections once every 14 days.  She is tolerating her meds without any side effects or injection site reactions.  Patient reports that she had 1 dose of Hyrimoz in January 2025 while recovering from an upper respiratory tract infection.  She denies any other recurrent infections.  Patient reports that she has had an 80 to 85% improvement in her symptoms while on Hyrimoz.  Her hand pain and elbow pain has improved.  She continues to have discomfort in her right shoulder and has been under the care of Dr. Margaretha Sheffield (sports medicine) and had a right shoulder cortisone injection performed on 06/28/2023.  She was instructed to continue home exercises and to follow-up if her symptoms persist or worsened.  She has noticed some weakness in her right knee especially after sitting for prolonged periods of time with her knee fully flexed.  She denies any warmth or swelling in her knee.  She denies any Achilles tendinitis or plantar fasciitis.  She denies any active psoriasis at this time.  She denies any new medical conditions.    Activities of Daily Living:  Patient reports morning stiffness for a few minutes.   Patient Reports nocturnal pain.  Difficulty dressing/grooming: Denies Difficulty climbing stairs: Denies Difficulty getting out of chair: Denies Difficulty using hands for taps, buttons, cutlery, and/or writing: Denies  Review of Systems  Constitutional:  Negative for fatigue.  HENT:  Negative for mouth sores, mouth dryness and nose dryness.   Eyes:  Negative for pain and dryness.  Respiratory:   Negative for shortness of breath and difficulty breathing.   Cardiovascular:  Negative for chest pain and palpitations.  Gastrointestinal:  Negative for blood in stool, constipation and diarrhea.  Endocrine: Negative for increased urination.  Genitourinary:  Negative for involuntary urination.  Musculoskeletal:  Positive for joint pain, joint pain, muscle weakness and morning stiffness. Negative for joint swelling, myalgias, muscle tenderness and myalgias.  Skin:  Negative for color change, rash, hair loss and sensitivity to sunlight.  Allergic/Immunologic: Positive for susceptible to infections.  Neurological:  Negative for dizziness and headaches.  Hematological:  Negative for swollen glands.  Psychiatric/Behavioral:  Positive for sleep disturbance. Negative for depressed mood. The patient is not nervous/anxious.     PMFS History:  Patient Active Problem List   Diagnosis Date Noted   Weight gain 12/20/2022   GERD (gastroesophageal reflux disease) 05/03/2022   DDD (degenerative disc disease), lumbar 04/11/2022   Lateral epicondylitis, right elbow 04/27/2021   CMC (carpometacarpal) synovitis 02/15/2021   Lateral epicondylitis of left elbow 02/15/2021   Shoulder impingement syndrome, right 02/15/2021   Preventative health care 02/02/2021   Arthralgia 02/02/2021   Thumb injury, right, initial encounter 11/08/2017   Sciatica 07/10/2014   Low back pain 05/10/2012   UNSPECIFIED MYALGIA AND MYOSITIS 04/28/2008   Anxiety and depression 09/08/2006   Depressive disorder, not elsewhere classified 09/08/2006   Allergic rhinitis 09/06/2006   INSOMNIA 09/06/2006    Past Medical  History:  Diagnosis Date   Allergy    Anal fissure    Anxiety    Arthritis    Depression    External hemorrhoids     Family History  Problem Relation Age of Onset   Colitis Mother    Basal cell carcinoma Mother    Arthritis Mother    Breast cancer Mother    Crohn's disease Father        questionable  diagnosis   Protein C deficiency Sister    Deep vein thrombosis Sister    Pulmonary embolism Sister    Other Sister        pre-diabetes   Other Sister        pre-diabetes   Ankylosing spondylitis Sister    Colitis Sister    Other Brother        pre-diabetes   Heart disease Paternal Grandmother    Colon cancer Neg Hx    Cancer Neg Hx    Drug abuse Neg Hx    Stroke Neg Hx    Rectal cancer Neg Hx    Stomach cancer Neg Hx    Past Surgical History:  Procedure Laterality Date   BREAST BIOPSY Right 06/20/2022   MM RT BREAST BX W LOC DEV 1ST LESION IMAGE BX SPEC STEREO GUIDE 06/20/2022 [facility]   COLONOSCOPY N/A 12/14/2012   Procedure: COLONOSCOPY;  Surgeon: Hart Carwin, MD;  Location: [facility];  Service: Endoscopy;  Laterality: N/A;   ORIF FINGER / THUMB FRACTURE Right    WISDOM TOOTH EXTRACTION     Social History   Social History Narrative   Works at [employer] with fiance   No children    Immunization History  Administered Date(s) Administered   Influenza,inj,Quad PF,6+ Mos 03/27/2017, 04/03/2018, 02/26/2019, 02/02/2021   Moderna Sars-Covid-2 Vaccination 11/13/2019, 12/11/2019   Td 06/29/2009, 11/06/2017     Objective: Vital Signs: BP 121/84 (BP Location: Left Arm, Patient Position: Sitting, Cuff Size: Normal)   Pulse 75   Resp 14   Ht 5\' 4"  (1.626 m)   Wt 155 lb 9.6 oz (70.6 kg)   LMP 06/23/2023   BMI 26.71 kg/m    Physical Exam Vitals and nursing note reviewed.  Constitutional:      Appearance: She is well-developed.  HENT:     Head: Normocephalic and atraumatic.  Eyes:     Conjunctiva/sclera: Conjunctivae normal.  Cardiovascular:     Rate and Rhythm: Normal rate and regular rhythm.     Heart sounds: Normal heart sounds.  Pulmonary:     Effort: Pulmonary effort is normal.     Breath sounds: Normal breath sounds.  Abdominal:     General: Bowel sounds are normal.     Palpations: Abdomen is soft.  Musculoskeletal:     Cervical  back: Normal range of motion.  Lymphadenopathy:     Cervical: No cervical adenopathy.  Skin:    General: Skin is warm and dry.     Capillary Refill: Capillary refill takes less than 2 seconds.  Neurological:     Mental Status: She is alert and oriented to person, place, and time.  Psychiatric:        Behavior: Behavior normal.      Musculoskeletal Exam: C-spine, thoracic spine, lumbar spine have good range of motion.  Shoulder joints have good range of motion with some tenderness anteriorly on the right shoulder.  Elbow joints, wrist joints, MCPs, PIPs, DIPs have good range of motion with  no synovitis.  Complete fist formation bilaterally.  Hip joints have good range of motion with no groin pain.  Knee joints have good range of motion with no warmth or effusion.  Ankle joints have good range of motion with no tenderness or joint swelling.  No evidence of Achilles tendinitis.  CDAI Exam: CDAI Score: -- Patient Global: --; Provider Global: -- Swollen: --; Tender: -- Joint Exam 07/17/2023   No joint exam has been documented for this visit   There is currently no information documented on the homunculus. Go to the Rheumatology activity and complete the homunculus joint exam.  Investigation: No additional findings.  Imaging: No results found.  Recent Labs: Lab Results  Component Value Date   WBC 8.4 03/23/2023   HGB 13.2 03/23/2023   PLT 202 03/23/2023   NA 138 03/23/2023   K 4.1 03/23/2023   CL 103 03/23/2023   CO2 23 03/23/2023   GLUCOSE 86 03/23/2023   BUN 18 03/23/2023   CREATININE 0.78 03/23/2023   BILITOT 0.4 03/23/2023   ALKPHOS 40 07/10/2019   AST 21 03/23/2023   ALT 29 03/23/2023   PROT 7.0 03/23/2023   ALBUMIN 4.3 07/10/2019   CALCIUM 9.3 03/23/2023   GFRAA  10/05/2008    >60        The eGFR has been calculated using the MDRD equation. This calculation has not been validated in all clinical situations. eGFR's persistently <60 mL/min signify possible  Chronic Kidney Disease.   QFTBGOLDPLUS NEGATIVE 03/23/2023    Speciality Comments: No specialty comments available.  Procedures:  No procedures performed Allergies: Patient has no known allergies.   Assessment / Plan:     Visit Diagnoses: Psoriatic arthritis (HCC) - History of inflammatory arthritis.  Pain in right shoulder, bilateral lateral epicondylitis, left second MCP synovitis, chronic SI joint pain: She has no synovitis or dactylitis on examination today.  She has not had any signs or symptoms of a psoriatic arthritis flare.  She has no active psoriasis at this time.  No evidence of Achilles tendinitis or plantar fasciitis.  No SI joint pain.  She has noticed an 80 to 85% improvement in her symptoms since initiating Hyrimoz on 02/22/2023.  She is tolerating Hyrimoz without any side effects or injection site reactions.  She was advised to notify us if she develops signs or symptoms of a flare.  She will follow-up in the office in 5 months or sooner if needed.  Psoriasis - Scalp--Resolved.  Patient remain on Hyrimoz as prescribed.  She does not need a refill of clobetasol solution at this time since her psoriasis has been well-controlled.  High risk medication use - Hyrimoz 40 mg sq injections every 14 days. Inadequate response to Stelara 45 mg sq injections every 12 weeks--initiated Stelara on 05/18/22.  CBC and CMP updated on 03/23/23-WNL. Orders for CBC and CMP released today.  TB gold negative on 03/23/23.   Discussed the importance of holding hyrimoz if she develops signs or symptoms of an infection and to resume once the infection has completely cleared.   - Plan: CBC with Differential/Platelet, COMPLETE METABOLIC PANEL WITH GFR  HLA B27 positive  Shoulder impingement syndrome, right -Chronic pain. Under care of Dr. Barb Merino injection on 06/28/23.  Patient had temporary relief but her symptoms have recurred.  She has been experiencing nocturnal pain if she lays on her right  side at night.  She has been trying to perform home exercises but has had persistent discomfort.  Discussed that she  may require a MRI and physical therapy if her symptoms persist.  Pain in both hands: No tenderness or synovitis.  Improved since initiating Hyrimoz.  Lateral epicondylitis of both elbows: Improved.  Not currently symptomatic.  Degeneration of intervertebral disc of lumbar region without discogenic back pain or lower extremity pain: She continues to experience intermittent discomfort in her lower back.  She has no symptoms of radiculopathy.  Chronic SI joint pain: No SI joint pain currently.  Pain in both feet: No evidence of Achilles tendinitis or plantar fasciitis.  Good range of motion of both ankle joints with no tenderness or joint swelling.  Other medical conditions are listed as follows:  Collagenous colitis - Colonoscopy by Dr. Dickie La in the past and was diagnosed with collagenous colitis. Patient states she was treated with Lialda in the past.  Other fatigue  Anxiety and depression  Adjustment insomnia  Family history of ankylosing spondylitis-Sister  Family history of ulcerative colitis-mother  Family history of Crohn's disease-father  Orders: Orders Placed This Encounter  Procedures   CBC with Differential/Platelet   COMPLETE METABOLIC PANEL WITH GFR   No orders of the defined types were placed in this encounter.    Follow-Up Instructions: Return in about 5 months (around 12/17/2023) for Psoriatic arthritis.   Gearldine Bienenstock, PA-C  Note - This record has been created using Dragon software.  Chart creation errors have been sought, but may not always  have been located. Such creation errors do not reflect on  the standard of medical care.

## 2023-07-05 ENCOUNTER — Ambulatory Visit: Payer: 59

## 2023-07-05 VITALS — Ht 63.0 in | Wt 153.0 lb

## 2023-07-05 DIAGNOSIS — Z1211 Encounter for screening for malignant neoplasm of colon: Secondary | ICD-10-CM

## 2023-07-05 MED ORDER — SUFLAVE 178.7 G PO SOLR: 1.0000 | ORAL | 0 refills | Status: DC

## 2023-07-05 NOTE — Progress Notes (Signed)

## 2023-07-17 ENCOUNTER — Encounter: Payer: Self-pay | Admitting: Physician Assistant

## 2023-07-17 ENCOUNTER — Ambulatory Visit: Payer: 59 | Attending: Physician Assistant | Admitting: Physician Assistant

## 2023-07-17 VITALS — BP 121/84 | HR 75 | Resp 14 | Ht 64.0 in | Wt 155.6 lb

## 2023-07-17 DIAGNOSIS — M7711 Lateral epicondylitis, right elbow: Secondary | ICD-10-CM

## 2023-07-17 DIAGNOSIS — Z1589 Genetic susceptibility to other disease: Secondary | ICD-10-CM | POA: Diagnosis not present

## 2023-07-17 DIAGNOSIS — L4050 Arthropathic psoriasis, unspecified: Secondary | ICD-10-CM | POA: Diagnosis not present

## 2023-07-17 DIAGNOSIS — M79641 Pain in right hand: Secondary | ICD-10-CM

## 2023-07-17 DIAGNOSIS — M7541 Impingement syndrome of right shoulder: Secondary | ICD-10-CM

## 2023-07-17 DIAGNOSIS — Z79899 Other long term (current) drug therapy: Secondary | ICD-10-CM | POA: Diagnosis not present

## 2023-07-17 DIAGNOSIS — F5102 Adjustment insomnia: Secondary | ICD-10-CM

## 2023-07-17 DIAGNOSIS — M79671 Pain in right foot: Secondary | ICD-10-CM

## 2023-07-17 DIAGNOSIS — F419 Anxiety disorder, unspecified: Secondary | ICD-10-CM

## 2023-07-17 DIAGNOSIS — Z8269 Family history of other diseases of the musculoskeletal system and connective tissue: Secondary | ICD-10-CM

## 2023-07-17 DIAGNOSIS — Z8379 Family history of other diseases of the digestive system: Secondary | ICD-10-CM

## 2023-07-17 DIAGNOSIS — M79642 Pain in left hand: Secondary | ICD-10-CM

## 2023-07-17 DIAGNOSIS — M7712 Lateral epicondylitis, left elbow: Secondary | ICD-10-CM

## 2023-07-17 DIAGNOSIS — L409 Psoriasis, unspecified: Secondary | ICD-10-CM

## 2023-07-17 DIAGNOSIS — F32A Depression, unspecified: Secondary | ICD-10-CM

## 2023-07-17 DIAGNOSIS — G8929 Other chronic pain: Secondary | ICD-10-CM

## 2023-07-17 DIAGNOSIS — M51369 Other intervertebral disc degeneration, lumbar region without mention of lumbar back pain or lower extremity pain: Secondary | ICD-10-CM

## 2023-07-17 DIAGNOSIS — M79672 Pain in left foot: Secondary | ICD-10-CM

## 2023-07-17 DIAGNOSIS — K52831 Collagenous colitis: Secondary | ICD-10-CM

## 2023-07-17 DIAGNOSIS — R5383 Other fatigue: Secondary | ICD-10-CM

## 2023-07-17 DIAGNOSIS — M533 Sacrococcygeal disorders, not elsewhere classified: Secondary | ICD-10-CM

## 2023-07-17 NOTE — Patient Instructions (Signed)
Standing Labs We placed an order today for your standing lab work.   Please have your standing labs drawn in mid-June and every 3 months   Please have your labs drawn 2 weeks prior to your appointment so that the provider can discuss your lab results at your appointment, if possible.  Please note that you may see your imaging and lab results in MyChart before we have reviewed them. We will contact you once all results are reviewed. Please allow our office up to 72 hours to thoroughly review all of the results before contacting the office for clarification of your results.  WALK-IN LAB HOURS  Monday through Thursday from 8:00 am -12:30 pm and 1:00 pm-5:00 pm and Friday from 8:00 am-12:00 pm.  Patients with office visits requiring labs will be seen before walk-in labs.  You may encounter longer than normal wait times. Please allow additional time. Wait times may be shorter on  Monday and Thursday afternoons.  We do not book appointments for walk-in labs. We appreciate your patience and understanding with our staff.   Labs are drawn by [facility]. Please bring your co-pay at the time of your lab draw.  You may receive a bill from [facility] for your lab work.  Please note if you are on Hydroxychloroquine and and an order has been placed for a Hydroxychloroquine level,  you will need to have it drawn 4 hours or more after your last dose.  If you wish to have your labs drawn at another location, please call the office 24 hours in advance so we can fax the orders.  The office is located at [address], [place], [place] [address]   If you have any questions regarding directions or hours of operation,  please call [phone].   As a reminder, please drink plenty of water prior to coming for your lab work. Thanks!

## 2023-07-18 LAB — CBC WITH DIFFERENTIAL/PLATELET
Absolute Lymphocytes: 2554 {cells}/uL (ref 850–3900)
Absolute Monocytes: 757 {cells}/uL (ref 200–950)
Basophils Absolute: 45 {cells}/uL (ref 0–200)
Basophils Relative: 0.5 %
Eosinophils Absolute: 285 {cells}/uL (ref 15–500)
Eosinophils Relative: 3.2 %
HCT: 39.6 % (ref 35.0–45.0)
Hemoglobin: 12.9 g/dL (ref 11.7–15.5)
MCH: 28.7 pg (ref 27.0–33.0)
MCHC: 32.6 g/dL (ref 32.0–36.0)
MCV: 88.2 fL (ref 80.0–100.0)
MPV: 10.4 fL (ref 7.5–12.5)
Monocytes Relative: 8.5 %
Neutro Abs: 5260 {cells}/uL (ref 1500–7800)
Neutrophils Relative %: 59.1 %
Platelets: 210 10*3/uL (ref 140–400)
RBC: 4.49 10*6/uL (ref 3.80–5.10)
RDW: 13.2 % (ref 11.0–15.0)
Total Lymphocyte: 28.7 %
WBC: 8.9 10*3/uL (ref 3.8–10.8)

## 2023-07-18 LAB — COMPLETE METABOLIC PANEL WITH GFR
AG Ratio: 2.2 (calc) (ref 1.0–2.5)
ALT: 23 U/L (ref 6–29)
AST: 23 U/L (ref 10–35)
Albumin: 4.9 g/dL (ref 3.6–5.1)
Alkaline phosphatase (APISO): 40 U/L (ref 31–125)
BUN: 15 mg/dL (ref 7–25)
CO2: 26 mmol/L (ref 20–32)
Calcium: 9.4 mg/dL (ref 8.6–10.2)
Chloride: 103 mmol/L (ref 98–110)
Creat: 0.78 mg/dL (ref 0.50–0.99)
Globulin: 2.2 g/dL (ref 1.9–3.7)
Glucose, Bld: 75 mg/dL (ref 65–99)
Potassium: 4.3 mmol/L (ref 3.5–5.3)
Sodium: 139 mmol/L (ref 135–146)
Total Bilirubin: 0.5 mg/dL (ref 0.2–1.2)
Total Protein: 7.1 g/dL (ref 6.1–8.1)
eGFR: 95 mL/min/{1.73_m2} (ref >=60)

## 2023-07-18 NOTE — Progress Notes (Signed)
 CBC and CMP WNL

## 2023-07-20 ENCOUNTER — Ambulatory Visit

## 2023-07-24 ENCOUNTER — Other Ambulatory Visit: Payer: Self-pay | Admitting: Physician Assistant

## 2023-07-24 DIAGNOSIS — Z79899 Other long term (current) drug therapy: Secondary | ICD-10-CM

## 2023-07-24 DIAGNOSIS — L409 Psoriasis, unspecified: Secondary | ICD-10-CM

## 2023-07-24 DIAGNOSIS — L4050 Arthropathic psoriasis, unspecified: Secondary | ICD-10-CM

## 2023-07-25 ENCOUNTER — Other Ambulatory Visit: Payer: Self-pay | Admitting: Family

## 2023-07-25 ENCOUNTER — Encounter: Payer: 59 | Admitting: Gastroenterology

## 2023-07-25 NOTE — Telephone Encounter (Signed)
 Last Fill: 04/04/2023  Labs: 07/17/2023 CBC and CMP WNL   TB Gold: 03/23/2023 Neg    Next Visit: 12/26/2023  Last Visit: 07/17/2023  [id number] arthritis   Current Dose per office note 07/17/2023: Hyrimoz 40 mg sq injections every 14 days   Okay to refill Hyrimoz?

## 2023-08-01 ENCOUNTER — Ambulatory Visit
Admission: RE | Admit: 2023-08-01 | Discharge: 2023-08-01 | Disposition: A | Discharge status: Home / Self Care | Source: Ambulatory Visit | Attending: Obstetrics and Gynecology | Admitting: Obstetrics and Gynecology

## 2023-08-01 DIAGNOSIS — Z1231 Encounter for screening mammogram for malignant neoplasm of breast: Secondary | ICD-10-CM

## 2023-08-04 ENCOUNTER — Encounter: Payer: Self-pay | Admitting: Gastroenterology

## 2023-08-08 ENCOUNTER — Ambulatory Visit: Payer: 59 | Admitting: Gastroenterology

## 2023-08-08 ENCOUNTER — Encounter: Payer: Self-pay | Admitting: Gastroenterology

## 2023-08-08 VITALS — BP 116/79 | HR 70 | Temp 98.4°F | Resp 11 | Ht 63.5 in | Wt 153.0 lb

## 2023-08-08 DIAGNOSIS — K648 Other hemorrhoids: Secondary | ICD-10-CM

## 2023-08-08 DIAGNOSIS — K5000 Crohn's disease of small intestine without complications: Secondary | ICD-10-CM | POA: Diagnosis not present

## 2023-08-08 DIAGNOSIS — Z1211 Encounter for screening for malignant neoplasm of colon: Secondary | ICD-10-CM

## 2023-08-08 DIAGNOSIS — Z8719 Personal history of other diseases of the digestive system: Secondary | ICD-10-CM

## 2023-08-08 DIAGNOSIS — K644 Residual hemorrhoidal skin tags: Secondary | ICD-10-CM | POA: Diagnosis not present

## 2023-08-08 DIAGNOSIS — K529 Noninfective gastroenteritis and colitis, unspecified: Secondary | ICD-10-CM | POA: Diagnosis not present

## 2023-08-08 DIAGNOSIS — K633 Ulcer of intestine: Secondary | ICD-10-CM | POA: Diagnosis not present

## 2023-08-08 MED ORDER — SODIUM CHLORIDE 0.9 % IV SOLN: 500.0000 mL | Freq: Once | INTRAVENOUS | Status: DC

## 2023-08-08 NOTE — Progress Notes (Signed)
 Pt's states no medical or surgical changes since previsit or office visit.

## 2023-08-08 NOTE — Op Note (Signed)
 [facility] Patient Name: [name] [name] Procedure Date: 08/08/2023 9:35 AM MRN: [id number] Endoscopist: Napoleon Form , MD, [id number] Age: 48 Referring MD:  Date of Birth: [birth year] Gender: Female Account #: [id number] Procedure:                Colonoscopy Indications:              Screening for colorectal malignant neoplasm Medicines:                Monitored Anesthesia Care Procedure:                Pre-Anesthesia Assessment:                           - Prior to the procedure, a History and Physical                            was performed, and patient medications and                            allergies were reviewed. The patient's tolerance of                            previous anesthesia was also reviewed. The risks                            and benefits of the procedure and the sedation                            options and risks were discussed with the patient.                            All questions were answered, and informed consent                            was obtained. Prior Anticoagulants: The patient has                            taken no anticoagulant or antiplatelet agents. ASA                            Grade Assessment: II - A patient with mild systemic                            disease. After reviewing the risks and benefits,                            the patient was deemed in satisfactory condition to                            undergo the procedure.                           After obtaining informed consent, the colonoscope  was passed under direct vision. Throughout the                            procedure, the patient's blood pressure, pulse, and                            oxygen saturations were monitored continuously. The                            Olympus Scope [id number] was introduced through the                            anus and advanced to the the cecum, identified by                             appendiceal orifice and ileocecal valve. The                            colonoscopy was performed without difficulty. The                            patient tolerated the procedure well. The quality                            of the bowel preparation was good. The ileocecal                            valve, appendiceal orifice, and rectum were                            photographed. Scope In: 9:43:01 AM Scope Out: 9:59:49 AM Scope Withdrawal Time: 0 hours 8 minutes 33 seconds  Total Procedure Duration: 0 hours 16 minutes 48 seconds  Findings:                 The perianal and digital rectal examinations were                            normal.                           A localized area of mildly erythematous mucosa was                            found at the ileocecal valve.                           A patchy area of mildly erythematous mucosa was                            found in the entire colon. Biopsies were taken with                            a cold forceps for histology.  Patchy moderate inflammation characterized by                            altered vascularity, congestion (edema), erosions,                            erythema, friability, mucus, aphthous ulcerations                            and shallow ulcerations was found in the terminal                            ileum.                           Non-bleeding external and internal hemorrhoids were                            found during retroflexion. The hemorrhoids were                            small. Complications:            No immediate complications. Estimated Blood Loss:     Estimated blood loss was minimal. Impression:               - Erythematous mucosa at the ileocecal valve.                           - Erythematous mucosa in the entire examined colon.                            Biopsied.                           - Moderate inflammation was found in the ileum                             secondary to Crohn's disease with ileitis.                           - Non-bleeding external and internal hemorrhoids. Recommendation:           - Patient has a contact number available for                            emergencies. The signs and symptoms of potential                            delayed complications were discussed with the                            patient. Return to normal activities tomorrow.                            Written discharge instructions were provided to the  patient.                           - Resume previous diet.                           - Continue present medications.                           - Await pathology results.                           - Repeat colonoscopy in 1 year for surveillance                            based on pathology results. Napoleon Form, MD 08/08/2023 10:10:46 AM This report has been signed electronically.

## 2023-08-08 NOTE — Progress Notes (Signed)
 Sedate, gd SR, tolerated procedure well, VSS, report to RN

## 2023-08-08 NOTE — Patient Instructions (Signed)

## 2023-08-08 NOTE — Progress Notes (Signed)
 [facility] History and Physical   Primary Care Physician:  Sandford Craze, NP   Reason for Procedure:  Colorectal cancer screening  Plan:    Screening colonoscopy with possible interventions as needed     HPI: [name] [name] is a very pleasant 48 y.o. female here for screening colonoscopy. Denies any nausea, vomiting, abdominal pain, melena or bright red blood per rectum  The risks and benefits as well as alternatives of endoscopic procedure(s) have been discussed and reviewed. All questions answered. The patient agrees to proceed.    Past Medical History:  Diagnosis Date   Allergy    Anal fissure    Anxiety    Arthritis    Depression    External hemorrhoids     Past Surgical History:  Procedure Laterality Date   BREAST BIOPSY Right 06/20/2022   MM RT BREAST BX W LOC DEV 1ST LESION IMAGE BX SPEC STEREO GUIDE 06/20/2022 [facility]   COLONOSCOPY N/A 12/14/2012   Procedure: COLONOSCOPY;  Surgeon: Hart Carwin, MD;  Location: [facility];  Service: Endoscopy;  Laterality: N/A;   ORIF FINGER / THUMB FRACTURE Right    WISDOM TOOTH EXTRACTION      Prior to Admission medications   Medication Sig Start Date End Date Taking? Authorizing Provider  ascorbic acid (VITAMIN C) 500 MG tablet Take 500 mg by mouth as needed.   Yes [provider]  cetirizine (ZYRTEC) 10 MG tablet Take 10 mg by mouth daily.     Yes [provider]  Cholecalciferol (VITAMIN D-3) 125 MCG (5000 UT) TABS Take 1 tablet by mouth daily.   Yes [provider]  HYRIMOZ 40 MG/0.4ML SOAJ INJECT 1 PEN UNDER THE SKIN EVERY 14 DAYS 07/25/23  Yes Gearldine Bienenstock, PA-C  omeprazole (PRILOSEC) 20 MG capsule Take 20 mg by mouth daily.   Yes [provider]  OVER THE COUNTER MEDICATION daily. Provitalize   Yes [provider]  Probiotic Product (ALIGN) 4 MG CAPS Take 1 capsule by mouth as needed.   Yes [provider]  sertraline (ZOLOFT) 100  MG tablet TAKE 1 TABLET BY MOUTH TWICE A DAY 05/06/23  Yes Worthy Rancher B, FNP  traZODone (DESYREL) 100 MG tablet TAKE 1 TABLET BY MOUTH EVERYDAY AT BEDTIME 07/25/23  Yes Sandford Craze, NP  clobetasol (TEMOVATE) 0.05 % external solution Apply 1 Application topically daily as needed. 04/17/23   Gearldine Bienenstock, PA-C  tretinoin (RETIN-A) 0.05 % cream  10/11/21   [provider]    Current Outpatient Medications  Medication Sig Dispense Refill   ascorbic acid (VITAMIN C) 500 MG tablet Take 500 mg by mouth as needed.     cetirizine (ZYRTEC) 10 MG tablet Take 10 mg by mouth daily.       Cholecalciferol (VITAMIN D-3) 125 MCG (5000 UT) TABS Take 1 tablet by mouth daily.     HYRIMOZ 40 MG/0.4ML SOAJ INJECT 1 PEN UNDER THE SKIN EVERY 14 DAYS 0.8 mL 2   omeprazole (PRILOSEC) 20 MG capsule Take 20 mg by mouth daily.     OVER THE COUNTER MEDICATION daily. Provitalize     Probiotic Product (ALIGN) 4 MG CAPS Take 1 capsule by mouth as needed.     sertraline (ZOLOFT) 100 MG tablet TAKE 1 TABLET BY MOUTH TWICE A DAY 180 tablet 0   traZODone (DESYREL) 100 MG tablet TAKE 1 TABLET BY MOUTH EVERYDAY AT BEDTIME 90 tablet 0   clobetasol (TEMOVATE) 0.05 % external solution Apply  1 Application topically daily as needed. 50 mL 1   tretinoin (RETIN-A) 0.05 % cream      Current Facility-Administered Medications  Medication Dose Route Frequency Provider Last Rate Last Admin   0.9 %  sodium chloride infusion  500 mL Intravenous Once Betzabeth Derringer, Eleonore Chiquito, MD        Allergies as of 08/08/2023   (No Known Allergies)    Family History  Problem Relation Age of Onset   Colitis Mother    Basal cell carcinoma Mother    Arthritis Mother    Breast cancer Mother 50   Crohn's disease Father        questionable diagnosis   Protein C deficiency Sister    Deep vein thrombosis Sister    Pulmonary embolism Sister    Other Sister        pre-diabetes   Other Sister        pre-diabetes   Ankylosing spondylitis  Sister    Colitis Sister    Heart disease Paternal Grandmother    Other Brother        pre-diabetes   Colon cancer Neg Hx    Cancer Neg Hx    Drug abuse Neg Hx    Stroke Neg Hx    Rectal cancer Neg Hx    Stomach cancer Neg Hx     Social History   Socioeconomic History   Marital status: Single    Spouse name: Not on file   Number of children: 0   Years of education: Not on file   Highest education level: Not on file  Occupational History    Employer: [employer]  Tobacco Use   Smoking status: Former    Current packs/day: 0.00    Types: Cigarettes    Quit date: 05/02/2001    Years since quitting: 22.2    Passive exposure: Past   Smokeless tobacco: Never  Vaping Use   Vaping status: Never Used  Substance and Sexual Activity   Alcohol use: Yes    Comment: 3 drinks a week   Drug use: No   Sexual activity: Yes    Partners: Male    Birth control/protection: Condom  Other Topics Concern   Not on file  Social History Narrative   Works at [employer] with fiance   No children    Social Drivers of Corporate investment banker Strain: Not on file  Food Insecurity: Not on file  Transportation Needs: Not on file  Physical Activity: Not on file  Stress: Not on file  Social Connections: Not on file  Intimate Partner Violence: Not on file    Review of Systems:  All other review of systems negative except as mentioned in the HPI.  Physical Exam: Vital signs in last 24 hours: BP 130/89   Pulse 70   Temp 98.4 F (36.9 C)   Ht 5' 3.5" (1.613 m)   Wt 153 lb (69.4 kg)   LMP 07/27/2023   SpO2 100%   BMI 26.68 kg/m  General:   Alert, NAD Lungs:  Clear .   Heart:  Regular rate and rhythm Abdomen:  Soft, nontender and nondistended. Neuro/Psych:  Alert and cooperative. Normal mood and affect. A and O x 3  Reviewed labs, radiology imaging, old records and pertinent past GI work up  Patient is appropriate for planned procedure(s) and anesthesia in an  ambulatory setting   K. Scherry Ran , MD [phone]

## 2023-08-08 NOTE — Progress Notes (Signed)
 Called to room to assist during endoscopic procedure.  Patient ID and intended procedure confirmed with present staff. Received instructions for my participation in the procedure from the performing physician.

## 2023-08-09 ENCOUNTER — Telehealth: Payer: Self-pay

## 2023-08-09 NOTE — Telephone Encounter (Signed)
  Follow up Call-     08/08/2023    8:52 AM  Call back number  Post procedure Call Back phone  # [phone]  Permission to leave phone message Yes     Patient questions:  Do you have a fever, pain , or abdominal swelling? No. Pain Score  0 *  Have you tolerated food without any problems? Yes.    Have you been able to return to your normal activities? Yes.    Do you have any questions about your discharge instructions: Diet   No. Medications  No. Follow up visit  No.  Do you have questions or concerns about your Care? No.  Actions: * If pain score is 4 or above: No action needed, pain <4.

## 2023-08-10 LAB — SURGICAL PATHOLOGY

## 2023-08-13 ENCOUNTER — Other Ambulatory Visit: Payer: Self-pay | Admitting: Family

## 2023-08-30 ENCOUNTER — Telehealth: Payer: Self-pay | Admitting: Family

## 2023-08-30 MED ORDER — SERTRALINE HCL 100 MG PO TABS: 100.0000 mg | ORAL_TABLET | Freq: Two times a day (BID) | ORAL | 0 refills | Status: DC

## 2023-08-30 NOTE — Telephone Encounter (Signed)
 Refill pending since 4/13 - pt is out of medication  Copied from CRM [id number]. Topic: Clinical - Prescription Issue >> Aug 30, 2023 10:34 AM Kita Perish H wrote: Reason for CRM: Patient calling to check status of medication refill for her sertraline  (ZOLOFT ) 100 MG tablet, request pending since 4/13 and patient states she's out of medication.  [place] [phone]

## 2023-08-30 NOTE — Telephone Encounter (Signed)
 Rx sent

## 2023-08-30 NOTE — Addendum Note (Signed)
 Addended by: Makynlie Rossini D on: 08/30/2023 12:42 PM   Modules accepted: Orders

## 2023-08-31 ENCOUNTER — Telehealth: Payer: Self-pay | Admitting: Emergency Medicine

## 2023-08-31 NOTE — Telephone Encounter (Signed)
 Copied from CRM [id number]. Topic: Clinical - Medication Question >> Aug 31, 2023  3:05 PM Abigail D wrote: Reason for CRM: sertraline  (ZOLOFT ) 100 MG, patient told per provider to come back in 6 months if needing more medications. She is wondering if this is specific to the provider or if there's any way she is able to get a 1 year supply of her medication.

## 2023-09-04 NOTE — Telephone Encounter (Signed)
Patient scheduled to be seen tomorrow.

## 2023-09-14 ENCOUNTER — Ambulatory Visit: Payer: Self-pay | Admitting: Gastroenterology

## 2023-09-28 ENCOUNTER — Other Ambulatory Visit: Payer: Self-pay | Admitting: Family

## 2023-10-16 ENCOUNTER — Telehealth: Payer: Self-pay | Admitting: *Deleted

## 2023-10-16 DIAGNOSIS — M7541 Impingement syndrome of right shoulder: Secondary | ICD-10-CM

## 2023-10-16 NOTE — Telephone Encounter (Signed)
 Patient calling stating right shoulder pain is no better. She is requesting MRI.

## 2023-10-17 NOTE — Telephone Encounter (Signed)
 MRI auth obtained and noted in WQ.

## 2023-10-17 NOTE — Telephone Encounter (Signed)
 MRI order placed. Will check for precert.

## 2023-10-17 NOTE — Addendum Note (Signed)
 Addended by: Claybon Cuna on: 10/17/2023 09:27 AM   Modules accepted: Orders

## 2023-10-18 NOTE — Progress Notes (Unsigned)
 Office Visit Note  Patient: [name] [name]             Date of Birth: 1976-01-04           MRN: [id number]             PCP: Dorrene Gaucher, NP Referring: Dorrene Gaucher, NP Visit Date: 10/19/2023 Occupation: @GUAROCC @  Subjective:  No chief complaint on file.   History of Present Illness: [name] [name] is a 48 y.o. female ***     Activities of Daily Living:  Patient reports morning stiffness for *** {minute/hour:19697}.   Patient {ACTIONS;DENIES/REPORTS:21021675::Denies} nocturnal pain.  Difficulty dressing/grooming: {ACTIONS;DENIES/REPORTS:21021675::Denies} Difficulty climbing stairs: {ACTIONS;DENIES/REPORTS:21021675::Denies} Difficulty getting out of chair: {ACTIONS;DENIES/REPORTS:21021675::Denies} Difficulty using hands for taps, buttons, cutlery, and/or writing: {ACTIONS;DENIES/REPORTS:21021675::Denies}  No Rheumatology ROS completed.   PMFS History:  Patient Active Problem List   Diagnosis Date Noted   Weight gain 12/20/2022   GERD (gastroesophageal reflux disease) 05/03/2022   DDD (degenerative disc disease), lumbar 04/11/2022   Lateral epicondylitis, right elbow 04/27/2021   CMC (carpometacarpal) synovitis 02/15/2021   Lateral epicondylitis of left elbow 02/15/2021   Shoulder impingement syndrome, right 02/15/2021   Preventative health care 02/02/2021   Arthralgia 02/02/2021   Thumb injury, right, initial encounter 11/08/2017   Sciatica 07/10/2014   Low back pain 05/10/2012   UNSPECIFIED MYALGIA AND MYOSITIS 04/28/2008   Anxiety and depression 09/08/2006   Depressive disorder, not elsewhere classified 09/08/2006   Allergic rhinitis 09/06/2006   INSOMNIA 09/06/2006    Past Medical History:  Diagnosis Date   Allergy    Anal fissure    Anxiety    Arthritis    Depression    External hemorrhoids     Family History  Problem Relation Age of Onset   Colitis Mother    Basal cell carcinoma Mother    Arthritis Mother    Breast  cancer Mother 5   Crohn's disease Father        questionable diagnosis   Protein C deficiency Sister    Deep vein thrombosis Sister    Pulmonary embolism Sister    Other Sister        pre-diabetes   Other Sister        pre-diabetes   Ankylosing spondylitis Sister    Colitis Sister    Heart disease Paternal Grandmother    Other Brother        pre-diabetes   Colon cancer Neg Hx    Cancer Neg Hx    Drug abuse Neg Hx    Stroke Neg Hx    Rectal cancer Neg Hx    Stomach cancer Neg Hx    Past Surgical History:  Procedure Laterality Date   BREAST BIOPSY Right 06/20/2022   MM RT BREAST BX W LOC DEV 1ST LESION IMAGE BX SPEC STEREO GUIDE 06/20/2022 [facility]   COLONOSCOPY N/A 12/14/2012   Procedure: COLONOSCOPY;  Surgeon: Pietro Bridegroom, MD;  Location: [facility];  Service: Endoscopy;  Laterality: N/A;   ORIF FINGER / THUMB FRACTURE Right    WISDOM TOOTH EXTRACTION     Social History   Social History Narrative   Works at [employer] with fiance   No children    Immunization History  Administered Date(s) Administered   Influenza,inj,Quad PF,6+ Mos 03/27/2017, 04/03/2018, 02/26/2019, 02/02/2021   Moderna Sars-Covid-2 Vaccination 11/13/2019, 12/11/2019   Td 06/29/2009, 11/06/2017     Objective: Vital Signs: There were no vitals taken for this visit.  Physical Exam   Musculoskeletal Exam: ***  CDAI Exam: CDAI Score: -- Patient Global: --; Provider Global: -- Swollen: --; Tender: -- Joint Exam 10/19/2023   No joint exam has been documented for this visit   There is currently no information documented on the homunculus. Go to the Rheumatology activity and complete the homunculus joint exam.  Investigation: No additional findings.  Imaging: No results found.  Recent Labs: Lab Results  Component Value Date   WBC 8.9 07/17/2023   HGB 12.9 07/17/2023   PLT 210 07/17/2023   NA 139 07/17/2023   K 4.3 07/17/2023   CL 103 07/17/2023   CO2 26  07/17/2023   GLUCOSE 75 07/17/2023   BUN 15 07/17/2023   CREATININE 0.78 07/17/2023   BILITOT 0.5 07/17/2023   ALKPHOS 40 07/10/2019   AST 23 07/17/2023   ALT 23 07/17/2023   PROT 7.1 07/17/2023   ALBUMIN 4.3 07/10/2019   CALCIUM  9.4 07/17/2023   GFRAA  10/05/2008    >60        The eGFR has been calculated using the MDRD equation. This calculation has not been validated in all clinical situations. eGFR's persistently <60 mL/min signify possible Chronic Kidney Disease.   QFTBGOLDPLUS NEGATIVE 03/23/2023    Speciality Comments: No specialty comments available.  Procedures:  No procedures performed Allergies: Patient has no known allergies.   Assessment / Plan:     Visit Diagnoses: No diagnosis found.  Orders: No orders of the defined types were placed in this encounter.  No orders of the defined types were placed in this encounter.   Face-to-face time spent with patient was *** minutes. Greater than 50% of time was spent in counseling and coordination of care.  Follow-Up Instructions: No follow-ups on file.   Nicholas Bari, MD  Note - This record has been created using Animal nutritionist.  Chart creation errors have been sought, but may not always  have been located. Such creation errors do not reflect on  the standard of medical care.

## 2023-10-19 ENCOUNTER — Ambulatory Visit: Attending: Rheumatology | Admitting: Rheumatology

## 2023-10-19 ENCOUNTER — Encounter: Payer: Self-pay | Admitting: Rheumatology

## 2023-10-19 ENCOUNTER — Ambulatory Visit (INDEPENDENT_AMBULATORY_CARE_PROVIDER_SITE_OTHER)

## 2023-10-19 VITALS — BP 125/83 | HR 80 | Resp 14 | Ht 64.0 in | Wt 154.0 lb

## 2023-10-19 DIAGNOSIS — L409 Psoriasis, unspecified: Secondary | ICD-10-CM | POA: Diagnosis not present

## 2023-10-19 DIAGNOSIS — F419 Anxiety disorder, unspecified: Secondary | ICD-10-CM

## 2023-10-19 DIAGNOSIS — R5383 Other fatigue: Secondary | ICD-10-CM

## 2023-10-19 DIAGNOSIS — Z79899 Other long term (current) drug therapy: Secondary | ICD-10-CM | POA: Diagnosis not present

## 2023-10-19 DIAGNOSIS — M79672 Pain in left foot: Secondary | ICD-10-CM

## 2023-10-19 DIAGNOSIS — Z1589 Genetic susceptibility to other disease: Secondary | ICD-10-CM | POA: Diagnosis not present

## 2023-10-19 DIAGNOSIS — F5102 Adjustment insomnia: Secondary | ICD-10-CM

## 2023-10-19 DIAGNOSIS — M25551 Pain in right hip: Secondary | ICD-10-CM | POA: Diagnosis not present

## 2023-10-19 DIAGNOSIS — Z8269 Family history of other diseases of the musculoskeletal system and connective tissue: Secondary | ICD-10-CM

## 2023-10-19 DIAGNOSIS — M51369 Other intervertebral disc degeneration, lumbar region without mention of lumbar back pain or lower extremity pain: Secondary | ICD-10-CM

## 2023-10-19 DIAGNOSIS — M79642 Pain in left hand: Secondary | ICD-10-CM

## 2023-10-19 DIAGNOSIS — L4050 Arthropathic psoriasis, unspecified: Secondary | ICD-10-CM | POA: Diagnosis not present

## 2023-10-19 DIAGNOSIS — Z8379 Family history of other diseases of the digestive system: Secondary | ICD-10-CM

## 2023-10-19 DIAGNOSIS — M542 Cervicalgia: Secondary | ICD-10-CM

## 2023-10-19 DIAGNOSIS — M79671 Pain in right foot: Secondary | ICD-10-CM

## 2023-10-19 DIAGNOSIS — G8929 Other chronic pain: Secondary | ICD-10-CM

## 2023-10-19 DIAGNOSIS — M7541 Impingement syndrome of right shoulder: Secondary | ICD-10-CM

## 2023-10-19 DIAGNOSIS — K52831 Collagenous colitis: Secondary | ICD-10-CM

## 2023-10-19 DIAGNOSIS — M79641 Pain in right hand: Secondary | ICD-10-CM

## 2023-10-19 DIAGNOSIS — F32A Depression, unspecified: Secondary | ICD-10-CM

## 2023-10-19 DIAGNOSIS — M533 Sacrococcygeal disorders, not elsewhere classified: Secondary | ICD-10-CM

## 2023-10-19 DIAGNOSIS — M7711 Lateral epicondylitis, right elbow: Secondary | ICD-10-CM

## 2023-10-19 LAB — CBC WITH DIFFERENTIAL/PLATELET
Absolute Lymphocytes: 2092 {cells}/uL (ref 850–3900)
Absolute Monocytes: 605 {cells}/uL (ref 200–950)
Basophils Absolute: 67 {cells}/uL (ref 0–200)
Basophils Relative: 0.8 %
Eosinophils Absolute: 260 {cells}/uL (ref 15–500)
Eosinophils Relative: 3.1 %
HCT: 45.2 % — ABNORMAL HIGH (ref 35.0–45.0)
Hemoglobin: 14 g/dL (ref 11.7–15.5)
MCH: 27.9 pg (ref 27.0–33.0)
MCHC: 31 g/dL — ABNORMAL LOW (ref 32.0–36.0)
MCV: 90.2 fL (ref 80.0–100.0)
MPV: 9.3 fL (ref 7.5–12.5)
Monocytes Relative: 7.2 %
Neutro Abs: 5376 {cells}/uL (ref 1500–7800)
Neutrophils Relative %: 64 %
Platelets: 353 10*3/uL (ref 140–400)
RBC: 5.01 10*6/uL (ref 3.80–5.10)
RDW: 13.4 % (ref 11.0–15.0)
Total Lymphocyte: 24.9 %
WBC: 8.4 10*3/uL (ref 3.8–10.8)

## 2023-10-19 LAB — COMPREHENSIVE METABOLIC PANEL WITH GFR
AG Ratio: 2 (calc) (ref 1.0–2.5)
ALT: 25 U/L (ref 6–29)
AST: 25 U/L (ref 10–35)
Albumin: 4.9 g/dL (ref 3.6–5.1)
Alkaline phosphatase (APISO): 56 U/L (ref 31–125)
BUN: 17 mg/dL (ref 7–25)
CO2: 27 mmol/L (ref 20–32)
Calcium: 9.8 mg/dL (ref 8.6–10.2)
Chloride: 104 mmol/L (ref 98–110)
Creat: 0.82 mg/dL (ref 0.50–0.99)
Globulin: 2.5 g/dL (ref 1.9–3.7)
Glucose, Bld: 114 mg/dL — ABNORMAL HIGH (ref 65–99)
Potassium: 5.3 mmol/L (ref 3.5–5.3)
Sodium: 139 mmol/L (ref 135–146)
Total Bilirubin: 0.5 mg/dL (ref 0.2–1.2)
Total Protein: 7.4 g/dL (ref 6.1–8.1)
eGFR: 89 mL/min/{1.73_m2} (ref >=60)

## 2023-10-19 NOTE — Patient Instructions (Signed)
 Standing Labs We placed an order today for your standing lab work.   Please have your standing labs drawn in September and every 3 months  Please have your labs drawn 2 weeks prior to your appointment so that the provider can discuss your lab results at your appointment, if possible.  Please note that you may see your imaging and lab results in MyChart before we have reviewed them. We will contact you once all results are reviewed. Please allow our office up to 72 hours to thoroughly review all of the results before contacting the office for clarification of your results.  WALK-IN LAB HOURS  Monday through Thursday from 8:00 am -12:30 pm and 1:00 pm-4:00 pm and Friday from 8:00 am-12:00 pm.  Patients with office visits requiring labs will be seen before walk-in labs.  You may encounter longer than normal wait times. Please allow additional time. Wait times may be shorter on  Monday and Thursday afternoons.  We do not book appointments for walk-in labs. We appreciate your patience and understanding with our staff.   Labs are drawn by [facility]. Please bring your co-pay at the time of your lab draw.  You may receive a bill from [facility] for your lab work.  Please note if you are on Hydroxychloroquine and and an order has been placed for a Hydroxychloroquine level,  you will need to have it drawn 4 hours or more after your last dose.  If you wish to have your labs drawn at another location, please call the office 24 hours in advance so we can fax the orders.  The office is located at [address], [place], [place] [address]   If you have any questions regarding directions or hours of operation,  please call [phone].   As a reminder, please drink plenty of water prior to coming for your lab work. Thanks!   Vaccines You are taking a medication(s) that can suppress your immune system.  The following immunizations are recommended: Flu annually Covid-19  Td/Tdap (tetanus,  diphtheria, pertussis) every 10 years Pneumonia (Prevnar 15 then Pneumovax 23 at least 1 year apart.  Alternatively, can take Prevnar 20 without needing additional dose) Shingrix: 2 doses from 4 weeks to 6 months apart  Please check with your PCP to make sure you are up to date.   If you have signs or symptoms of an infection or start antibiotics: First, call your PCP for workup of your infection. Hold your medication through the infection, until you complete your antibiotics, and until symptoms resolve if you take the following: Injectable medication (Actemra, Benlysta, Cimzia, Cosentyx, Enbrel, Humira , Kevzara, Orencia, Remicade, Simponi, Stelara , Taltz, Tremfya) Methotrexate Leflunomide (Arava) Mycophenolate (Cellcept) [name] [name], Olumiant, or Rinvoq  Please get annual skin examination to screen for skin cancer while you are on Humira  biosimilar.  Please use sunscreen and sun protection.

## 2023-10-20 ENCOUNTER — Ambulatory Visit: Payer: Self-pay | Admitting: Rheumatology

## 2023-10-20 NOTE — Progress Notes (Signed)
CBC and CMP are stable. Glucose is mildly elevated probably not a fasting sample.

## 2023-10-21 ENCOUNTER — Other Ambulatory Visit: Payer: Self-pay | Admitting: Family

## 2023-10-21 NOTE — Telephone Encounter (Signed)
 Please contact pt to schedule follow up.

## 2023-10-24 ENCOUNTER — Telehealth (INDEPENDENT_AMBULATORY_CARE_PROVIDER_SITE_OTHER): Admitting: Family

## 2023-10-24 DIAGNOSIS — K219 Gastro-esophageal reflux disease without esophagitis: Secondary | ICD-10-CM

## 2023-10-24 DIAGNOSIS — J309 Allergic rhinitis, unspecified: Secondary | ICD-10-CM

## 2023-10-24 DIAGNOSIS — G4700 Insomnia, unspecified: Secondary | ICD-10-CM | POA: Diagnosis not present

## 2023-10-24 MED ORDER — OMEPRAZOLE 20 MG PO CPDR: 20.0000 mg | DELAYED_RELEASE_CAPSULE | Freq: Every day | ORAL | 1 refills | Status: DC

## 2023-10-24 NOTE — Patient Instructions (Signed)
 VISIT SUMMARY:  During your follow-up visit, we discussed your mood, sleep, allergies, and reflux management. Your mood and sleep are well-managed with your current medications. Your allergies have returned to baseline after a recent trip, and your reflux symptoms are controlled with Prilosec.  YOUR PLAN:  GASTROESOPHAGEAL REFLUX DISEASE (GERD): Your reflux symptoms return when you stop taking Prilosec, indicating the need for ongoing treatment. -I have prescribed Prilosec and sent the prescription to [facility] on [address].  ALLERGIC RHINITIS: Your allergies worsened after your trip but have since returned to normal. -Continue taking Zyrtec as needed to manage your allergy symptoms.  GENERAL HEALTH MAINTENANCE: Your preventive care is up to date. -Schedule a physical examination in three months. -We will update your labs if needed at that time.

## 2023-10-24 NOTE — Assessment & Plan Note (Signed)
 Stable on otc omeprazole. Will send Rx.

## 2023-10-24 NOTE — Progress Notes (Signed)
 MyChart Video Visit    Virtual Visit via Video Note    Patient location: Home. Patient and provider in visit Provider location: Office  I discussed the limitations of evaluation and management by telemedicine and the availability of in person appointments. The patient expressed understanding and agreed to proceed.  Visit Date: 10/24/2023  Today's healthcare provider: Eleanor GORMAN Ponto, NP     Subjective:    Patient ID: [name] [name], female    DOB: 11/23/1975, 48 y.o.   MRN: [id number]  Chief Complaint  Patient presents with   Anxiety    Follow up    Anxiety      [name] [name] is a 48 year old female who presents for a follow-up visit to discuss mood, sleep, allergies, and reflux management.  Depression/Anxiety - Sertraline  used for mood management with effective symptom control.  Sleep disturbance - Trazodone  used for sleep with good results.  Allergic symptoms - Allergies worsened after recent trip to [place]  but have since returned to baseline. - Symptoms managed with over-the-counter Zyrtec.  Gastroesophageal reflux symptoms - Uses over-the-counter Prilosec for reflux management. - Attempts to discontinue Prilosec result in heartburn within two days. - No current reflux symptoms while maintained on Prilosec.  Past Medical History:  Diagnosis Date   Allergy    Anal fissure    Anxiety    Arthritis    Depression    External hemorrhoids     Past Surgical History:  Procedure Laterality Date   BREAST BIOPSY Right 06/20/2022   MM RT BREAST BX W LOC DEV 1ST LESION IMAGE BX SPEC STEREO GUIDE 06/20/2022 [facility]   COLONOSCOPY N/A 12/14/2012   Procedure: COLONOSCOPY;  Surgeon: Princella CHRISTELLA Nida, MD;  Location: [facility];  Service: Endoscopy;  Laterality: N/A;   ORIF FINGER / THUMB FRACTURE Right    WISDOM TOOTH EXTRACTION      Family History  Problem Relation Age of Onset   Colitis Mother    Basal cell carcinoma Mother     Arthritis Mother    Breast cancer Mother 8   Crohn's disease Father        questionable diagnosis   Protein C deficiency Sister    Deep vein thrombosis Sister    Pulmonary embolism Sister    Other Sister        pre-diabetes   Other Sister        pre-diabetes   Ankylosing spondylitis Sister    Colitis Sister    Heart disease Paternal Grandmother    Other Brother        pre-diabetes   Colon cancer Neg Hx    Cancer Neg Hx    Drug abuse Neg Hx    Stroke Neg Hx    Rectal cancer Neg Hx    Stomach cancer Neg Hx     Social History   Socioeconomic History   Marital status: Single    Spouse name: Not on file   Number of children: 0   Years of education: Not on file   Highest education level: 12th grade  Occupational History    Employer: [employer]  Tobacco Use   Smoking status: Former    Current packs/day: 0.00    Types: Cigarettes    Quit date: 05/02/2001    Years since quitting: 22.4    Passive exposure: Past   Smokeless tobacco: Never  Vaping Use   Vaping status: Never Used  Substance and Sexual Activity   Alcohol use: Yes  Comment: 3 drinks a week   Drug use: No   Sexual activity: Yes    Partners: Male    Birth control/protection: Condom  Other Topics Concern   Not on file  Social History Narrative   Works at [employer] with fiance   No children    Social Drivers of Corporate investment banker Strain: Low Risk  (10/24/2023)   Overall Financial Resource Strain (CARDIA)    Difficulty of Paying Living Expenses: Not very hard  Food Insecurity: No Food Insecurity (10/24/2023)   Hunger Vital Sign    Worried About Running Out of Food in the Last Year: Never true    Ran Out of Food in the Last Year: Never true  Transportation Needs: No Transportation Needs (10/24/2023)   PRAPARE - Administrator, Civil Service (Medical): No    Lack of Transportation (Non-Medical): No  Physical Activity: Insufficiently Active (10/24/2023)   Exercise Vital  Sign    Days of Exercise per Week: 3 days    Minutes of Exercise per Session: 30 min  Stress: No Stress Concern Present (10/24/2023)   [employer] of Occupational Health - Occupational Stress Questionnaire    Feeling of Stress: Only a little  Social Connections: Moderately Integrated (10/24/2023)   Social Connection and Isolation Panel    Frequency of Communication with Friends and Family: Three times a week    Frequency of Social Gatherings with Friends and Family: Once a week    Attends Religious Services: 1 to 4 times per year    Active Member of [employer] or Organizations: No    Attends Engineer, structural: Not on file    Marital Status: Married  Catering manager Violence: Not on file    Outpatient Medications Prior to Visit  Medication Sig Dispense Refill   ascorbic acid (VITAMIN C) 500 MG tablet Take 500 mg by mouth as needed.     cetirizine (ZYRTEC) 10 MG tablet Take 10 mg by mouth daily.       Cholecalciferol (VITAMIN D -3) 125 MCG (5000 UT) TABS Take 1 tablet by mouth daily.     clobetasol  (TEMOVATE ) 0.05 % external solution Apply 1 Application topically daily as needed. 50 mL 1   HYRIMOZ  40 MG/0.4ML SOAJ INJECT 1 PEN UNDER THE SKIN EVERY 14 DAYS 0.8 mL 2   OVER THE COUNTER MEDICATION daily. Provitalize     Probiotic Product (ALIGN) 4 MG CAPS Take 1 capsule by mouth as needed.     sertraline  (ZOLOFT ) 100 MG tablet Take 1 tablet (100 mg total) by mouth 2 (two) times daily. 60 tablet 0   traZODone  (DESYREL ) 100 MG tablet TAKE 1 TABLET BY MOUTH EVERYDAY AT BEDTIME 30 tablet 0   tretinoin (RETIN-A) 0.05 % cream      triamcinolone ointment (KENALOG) 0.1 % SMARTSIG:1 Topical Daily     omeprazole (PRILOSEC) 20 MG capsule Take 20 mg by mouth daily.     No facility-administered medications prior to visit.    No Known Allergies  ROS    See HPI Objective:    Physical Exam  LMP 10/15/2023  Wt Readings from Last 3 Encounters:  10/19/23 154 lb (69.9 kg)  08/08/23  153 lb (69.4 kg)  07/17/23 155 lb 9.6 oz (70.6 kg)   Gen: Awake, alert, no acute distress Resp: Breathing is even and non-labored Psych: calm/pleasant demeanor Neuro: Alert and Oriented x 3, + facial symmetry, speech is clear.  Assessment & Plan:   Problem List Items Addressed This Visit       Unprioritized   INSOMNIA - Primary   Stable on trazodone . Continue same.       GERD (gastroesophageal reflux disease)   Stable on otc omeprazole. Will send Rx.       Relevant Medications   omeprazole (PRILOSEC) 20 MG capsule   Allergic rhinitis   Currently stable.  Continues zyrtec.        I have changed [name] SAUNDERS. [name]'s omeprazole. I am also having her maintain her cetirizine, Align, OVER THE COUNTER MEDICATION, ascorbic acid, tretinoin, clobetasol , Vitamin D -3, Hyrimoz , sertraline , triamcinolone ointment, and traZODone .  Meds ordered this encounter  Medications   omeprazole (PRILOSEC) 20 MG capsule    Sig: Take 1 capsule (20 mg total) by mouth daily.    Dispense:  90 capsule    Refill:  1    Supervising Provider:   DOMENICA BLACKBIRD A [[id number]]    I discussed the assessment and treatment plan with the patient. The patient was provided an opportunity to ask questions and all were answered. The patient agreed with the plan and demonstrated an understanding of the instructions.   The patient was advised to call back or seek an in-person evaluation if the symptoms worsen or if the condition fails to improve as anticipated.   Eleanor GORMAN Ponto, NP [facility] [phone] (phone) [phone] (fax)  [facility]

## 2023-10-24 NOTE — Assessment & Plan Note (Signed)
Stable on trazodone. Continue same. 

## 2023-10-24 NOTE — Assessment & Plan Note (Signed)
 Currently stable.  Continues zyrtec.

## 2023-10-25 ENCOUNTER — Ambulatory Visit (HOSPITAL_BASED_OUTPATIENT_CLINIC_OR_DEPARTMENT_OTHER)
Admission: RE | Admit: 2023-10-25 | Discharge: 2023-10-25 | Disposition: A | Discharge status: Home / Self Care | Source: Ambulatory Visit | Attending: Sports Medicine | Admitting: Sports Medicine

## 2023-10-25 DIAGNOSIS — M7541 Impingement syndrome of right shoulder: Secondary | ICD-10-CM | POA: Diagnosis present

## 2023-10-30 ENCOUNTER — Encounter: Payer: Self-pay | Admitting: Sports Medicine

## 2023-11-01 ENCOUNTER — Telehealth: Payer: Self-pay | Admitting: *Deleted

## 2023-11-01 DIAGNOSIS — M19019 Primary osteoarthritis, unspecified shoulder: Secondary | ICD-10-CM

## 2023-11-01 NOTE — Telephone Encounter (Signed)
 Referral and MRI  faxed to [facility].     ----- Message from Evalene Bernardino Chang sent at 11/01/2023  3:03 PM EDT ----- Regarding: FW: Pt cld wants to be referred to Selinda Gosling @ [facility] Please make this referral ----- Message ----- From: Kayla Alisa CROME Sent: 11/01/2023   2:24 PM EDT To: Evalene JONELLE Chang, DO Subject: Pt cld wants to be referred to Selinda Gosling #  Patient called states she knows where she wants to be referred to  [facility] & to Dr.Rogers,Jason.  Pls & Thx

## 2023-11-14 ENCOUNTER — Telehealth: Payer: Self-pay | Admitting: Family

## 2023-11-14 ENCOUNTER — Other Ambulatory Visit: Payer: Self-pay | Admitting: Family

## 2023-11-14 NOTE — Telephone Encounter (Signed)
 Please contact pt to schedule follow up.

## 2023-11-16 ENCOUNTER — Other Ambulatory Visit: Payer: Self-pay | Admitting: Family

## 2023-11-20 ENCOUNTER — Other Ambulatory Visit: Payer: Self-pay | Admitting: Physician Assistant

## 2023-11-20 DIAGNOSIS — L409 Psoriasis, unspecified: Secondary | ICD-10-CM

## 2023-11-20 DIAGNOSIS — L4050 Arthropathic psoriasis, unspecified: Secondary | ICD-10-CM

## 2023-11-20 DIAGNOSIS — Z79899 Other long term (current) drug therapy: Secondary | ICD-10-CM

## 2023-11-21 NOTE — Telephone Encounter (Signed)
 Last Fill: 07/25/2023  Labs: 10/19/2023 CBC and CMP are stable. Glucose is mildly elevated probably not a fasting sample.   TB Gold: 03/23/2023 Neg    Next Visit: 03/20/2024  Last Visit: 10/19/2023  IK:Ednmpjupr arthritis   Current Dose per office note 10/19/2023: Hyrimoz  40 mg sq injections every 14 days   Okay to refill Hyrimoz ?

## 2023-11-30 ENCOUNTER — Telehealth: Payer: Self-pay

## 2023-11-30 NOTE — Telephone Encounter (Signed)
 Received notification from [facility] regarding a prior authorization for HYRIMOZ . Authorization has been APPROVED from 11/30/2023 to 11/29/2024. Approval letter sent to scan center.  Case#: [id number] Fax:[phone] Phone: [phone]

## 2023-11-30 NOTE — Telephone Encounter (Signed)
 Submitted a Prior Authorization request to [facility] for HYRIMOZ  via fax. Will update once we receive a response.  Case#: [id number]  Fax:[phone]  Phone: [phone]   Deleta Colt PharmD Candidate [address]  [facility]

## 2023-12-09 ENCOUNTER — Other Ambulatory Visit: Payer: Self-pay | Admitting: Family

## 2023-12-26 ENCOUNTER — Ambulatory Visit: Admitting: Rheumatology

## 2023-12-29 ENCOUNTER — Ambulatory Visit: Admitting: Gastroenterology

## 2024-01-11 ENCOUNTER — Other Ambulatory Visit: Payer: Self-pay | Admitting: Family

## 2024-01-29 ENCOUNTER — Telehealth: Payer: Self-pay | Admitting: Rheumatology

## 2024-01-29 ENCOUNTER — Other Ambulatory Visit: Payer: Self-pay | Admitting: *Deleted

## 2024-01-29 ENCOUNTER — Other Ambulatory Visit: Payer: Self-pay | Admitting: Family

## 2024-01-29 DIAGNOSIS — Z79899 Other long term (current) drug therapy: Secondary | ICD-10-CM

## 2024-01-29 MED ORDER — SERTRALINE HCL 100 MG PO TABS: 100.0000 mg | ORAL_TABLET | Freq: Two times a day (BID) | ORAL | 0 refills | Status: DC

## 2024-01-29 NOTE — Telephone Encounter (Signed)
Patient left a voicemail requesting a return call to let her know if she is due for labwork.

## 2024-01-29 NOTE — Telephone Encounter (Signed)
 Copied from CRM #[id number]. Topic: Clinical - Medication Refill >> Jan 29, 2024  9:53 AM Jasmin G wrote: Medication: sertraline  (ZOLOFT ) 100 MG tablet  Has the patient contacted their pharmacy? No (Agent: If no, request that the patient contact the pharmacy for the refill. If patient does not wish to contact the pharmacy document the reason why and proceed with request.) (Agent: If yes, when and what did the pharmacy advise?)  This is the patient's preferred pharmacy:  [phone] [place] [place] - [address] [place] [place] [address] Phone: [phone] Fax: [phone]  Is this the correct pharmacy for this prescription? Yes If no, delete pharmacy and type the correct one.   Has the prescription been filled recently? Yes  Is the patient out of the medication? No  Has the patient been seen for an appointment in the last year OR does the patient have an upcoming appointment? Yes  Can we respond through MyChart? Yes  Agent: Please be advised that Rx refills may take up to 3 business days. We ask that you follow-up with your pharmacy.

## 2024-01-29 NOTE — Telephone Encounter (Signed)
 Patient advised she is due for labs now. Patient states she will come to the office today to have those completed.

## 2024-01-30 ENCOUNTER — Ambulatory Visit (INDEPENDENT_AMBULATORY_CARE_PROVIDER_SITE_OTHER): Admitting: Family

## 2024-01-30 ENCOUNTER — Encounter: Payer: Self-pay | Admitting: Family

## 2024-01-30 VITALS — BP 103/63 | HR 92 | Temp 99.0°F | Resp 16 | Ht 64.0 in | Wt 151.0 lb

## 2024-01-30 DIAGNOSIS — F419 Anxiety disorder, unspecified: Secondary | ICD-10-CM | POA: Diagnosis not present

## 2024-01-30 DIAGNOSIS — K219 Gastro-esophageal reflux disease without esophagitis: Secondary | ICD-10-CM | POA: Diagnosis not present

## 2024-01-30 DIAGNOSIS — E785 Hyperlipidemia, unspecified: Secondary | ICD-10-CM | POA: Diagnosis not present

## 2024-01-30 DIAGNOSIS — R739 Hyperglycemia, unspecified: Secondary | ICD-10-CM

## 2024-01-30 DIAGNOSIS — Z0000 Encounter for general adult medical examination without abnormal findings: Secondary | ICD-10-CM | POA: Diagnosis not present

## 2024-01-30 DIAGNOSIS — G4700 Insomnia, unspecified: Secondary | ICD-10-CM | POA: Diagnosis not present

## 2024-01-30 DIAGNOSIS — F32A Depression, unspecified: Secondary | ICD-10-CM

## 2024-01-30 LAB — LIPID PANEL
Cholesterol: 235 mg/dL — ABNORMAL HIGH (ref 0–200)
HDL: 75.5 mg/dL (ref >39.00)
LDL Cholesterol: 138 mg/dL — ABNORMAL HIGH (ref 0–99)
NonHDL: 159.37
Total CHOL/HDL Ratio: 3
Triglycerides: 105 mg/dL (ref 0.0–149.0)
VLDL: 21 mg/dL (ref 0.0–40.0)

## 2024-01-30 LAB — HEMOGLOBIN A1C: Hgb A1c MFr Bld: 5.4 % (ref 4.6–6.5)

## 2024-01-30 MED ORDER — TRAZODONE HCL 100 MG PO TABS
100.0000 mg | ORAL_TABLET | Freq: Every day | ORAL | 4 refills | Status: AC
Start: 2024-01-30 — End: ?

## 2024-01-30 MED ORDER — OMEPRAZOLE 20 MG PO CPDR: 20.0000 mg | DELAYED_RELEASE_CAPSULE | Freq: Every day | ORAL | 4 refills | Status: AC

## 2024-01-30 MED ORDER — SERTRALINE HCL 100 MG PO TABS
100.0000 mg | ORAL_TABLET | Freq: Two times a day (BID) | ORAL | 4 refills | Status: AC
Start: 2024-01-30 — End: ?

## 2024-01-30 NOTE — Assessment & Plan Note (Signed)
 Tried to stop taking omeprazole  symptoms returned- will try every other day.

## 2024-01-30 NOTE — Assessment & Plan Note (Signed)
 Pap/mammo/colo up to date. Continue healthy diet and regular exercise.  Declines flu shot today- will get at work. Declines covid booster. Declines Hep B vaccine.

## 2024-01-30 NOTE — Progress Notes (Unsigned)
 Subjective:     Patient ID: [name] [name], female    DOB: [birth year], 48 y.o.   MRN: [id number]  Chief Complaint  Patient presents with   Annual Exam    HPI  Discussed the use of AI scribe software for clinical note transcription with the patient, who gave verbal consent to proceed.  History of Present Illness  Patient presents today for complete physical.  Immunizations:  td up to date, will get flu shot at her office, declies hep B Diet: healthy Exercise: walking, golf Colonoscopy: due 4/28 Pap Smear:  due 4/26 Mammogram: 08/01/23 Vision: schedule Dental: up to date      Health Maintenance Due  Topic Date Due   Hepatitis B Vaccines 19-59 Average Risk (1 of 3 - 19+ 3-dose series) Never done   COVID-19 Vaccine (3 - Moderna risk series) 01/08/2020    Past Medical History:  Diagnosis Date   Allergy    Anal fissure    Anxiety    Arthritis    Depression    External hemorrhoids     Past Surgical History:  Procedure Laterality Date   BREAST BIOPSY Right 06/20/2022   MM RT BREAST BX W LOC DEV 1ST LESION IMAGE BX SPEC STEREO GUIDE 06/20/2022 [facility]   COLONOSCOPY N/A 12/14/2012   Procedure: COLONOSCOPY;  Surgeon: Princella CHRISTELLA Nida, MD;  Location: [facility];  Service: Endoscopy;  Laterality: N/A;   ORIF FINGER / THUMB FRACTURE Right    right shoulder surgery Right 01/2024   WISDOM TOOTH EXTRACTION      Family History  Problem Relation Age of Onset   Colitis Mother    Basal cell carcinoma Mother    Arthritis Mother    Breast cancer Mother 48   Crohn's disease Father        questionable diagnosis   Protein C deficiency Sister    Deep vein thrombosis Sister    Pulmonary embolism Sister    Other Sister        pre-diabetes   Other Sister        pre-diabetes   Ankylosing spondylitis Sister    Colitis Sister    Other Brother        pre-diabetes   Heart disease Paternal Grandmother    Dementia Paternal Grandfather    Depression Paternal  Grandfather    Colon cancer Neg Hx    Cancer Neg Hx    Drug abuse Neg Hx    Stroke Neg Hx    Rectal cancer Neg Hx    Stomach cancer Neg Hx     Social History   Socioeconomic History   Marital status: Single    Spouse name: Not on file   Number of children: 0   Years of education: Not on file   Highest education level: 12th grade  Occupational History    Employer: [employer]  Tobacco Use   Smoking status: Former    Current packs/day: 0.00    Types: Cigarettes    Quit date: 05/02/2001    Years since quitting: 22.7    Passive exposure: Past   Smokeless tobacco: Never  Vaping Use   Vaping status: Never Used  Substance and Sexual Activity   Alcohol use: Yes    Comment: 3 drinks a week   Drug use: No   Sexual activity: Yes    Partners: Male    Birth control/protection: Condom  Other Topics Concern   Not on file  Social History Narrative   Works  at [employer] with husband   No children    2 golden doodles   Social Drivers of Corporate investment banker Strain: Low Risk  (10/24/2023)   Overall Financial Resource Strain (CARDIA)    Difficulty of Paying Living Expenses: Not very hard  Food Insecurity: No Food Insecurity (10/24/2023)   Hunger Vital Sign    Worried About Running Out of Food in the Last Year: Never true    Ran Out of Food in the Last Year: Never true  Transportation Needs: No Transportation Needs (10/24/2023)   PRAPARE - Administrator, Civil Service (Medical): No    Lack of Transportation (Non-Medical): No  Physical Activity: Insufficiently Active (10/24/2023)   Exercise Vital Sign    Days of Exercise per Week: 3 days    Minutes of Exercise per Session: 30 min  Stress: No Stress Concern Present (10/24/2023)   [employer] of Occupational Health - Occupational Stress Questionnaire    Feeling of Stress: Only a little  Social Connections: Moderately Integrated (10/24/2023)   Social Connection and Isolation Panel    Frequency of  Communication with Friends and Family: Three times a week    Frequency of Social Gatherings with Friends and Family: Once a week    Attends Religious Services: 1 to 4 times per year    Active Member of [employer] or Organizations: No    Attends Engineer, structural: Not on file    Marital Status: Married  Catering manager Violence: Not on file    Outpatient Medications Prior to Visit  Medication Sig Dispense Refill   ascorbic acid (VITAMIN C) 500 MG tablet Take 500 mg by mouth as needed.     cetirizine (ZYRTEC) 10 MG tablet Take 10 mg by mouth daily.       Cholecalciferol (VITAMIN D -3) 125 MCG (5000 UT) TABS Take 1 tablet by mouth daily.     clobetasol  (TEMOVATE ) 0.05 % external solution Apply 1 Application topically daily as needed. 50 mL 1   HYRIMOZ  40 MG/0.4ML SOAJ INJECT 1 PEN UNDER THE SKIN EVERY 14 DAYS 0.8 mL 2   tretinoin (RETIN-A) 0.05 % cream      triamcinolone ointment (KENALOG) 0.1 % SMARTSIG:1 Topical Daily     omeprazole  (PRILOSEC) 20 MG capsule Take 1 capsule (20 mg total) by mouth daily. 90 capsule 1   sertraline  (ZOLOFT ) 100 MG tablet Take 1 tablet (100 mg total) by mouth 2 (two) times daily. 180 tablet 0   traZODone  (DESYREL ) 100 MG tablet Take 1 tablet (100 mg total) by mouth at bedtime. 90 tablet 0   OVER THE COUNTER MEDICATION daily. Provitalize     Probiotic Product (ALIGN) 4 MG CAPS Take 1 capsule by mouth as needed.     No facility-administered medications prior to visit.    No Known Allergies  Review of Systems  Constitutional:  Positive for weight loss.  HENT:  Negative for congestion and hearing loss.   Eyes:  Negative for blurred vision.  Respiratory:  Negative for cough.   Cardiovascular:  Negative for leg swelling.  Gastrointestinal:  Negative for constipation and diarrhea.  Genitourinary:  Negative for dysuria and frequency.  Musculoskeletal:  Positive for joint pain and neck pain.  Skin:  Negative for rash.  Neurological:  Negative for  headaches.  Psychiatric/Behavioral:         Mood is stable.        Objective:    Physical Exam  BP 103/63 (BP Location: Right Arm, Patient Position: Sitting, Cuff Size: Small)   Pulse 92   Temp 99 F (37.2 C) (Oral)   Resp 16   Ht 5' 4 (1.626 m)   Wt 151 lb (68.5 kg)   SpO2 100%   BMI 25.92 kg/m  Wt Readings from Last 3 Encounters:  01/30/24 151 lb (68.5 kg)  10/19/23 154 lb (69.9 kg)  08/08/23 153 lb (69.4 kg)   Physical Exam  Constitutional: She is oriented to person, place, and time. She appears well-developed and well-nourished. No distress.  HENT:  Head: Normocephalic and atraumatic.  Right Ear: Tympanic membrane and ear canal normal.  Left Ear: Tympanic membrane and ear canal normal.  Mouth/Throat: Oropharynx is clear and moist.  Eyes: Pupils are equal, round, and reactive to light. No scleral icterus.  Neck: Normal range of motion. No thyromegaly present.  Cardiovascular: Normal rate and regular rhythm.   No murmur heard. Pulmonary/Chest: Effort normal and breath sounds normal. No respiratory distress. He has no wheezes. She has no rales. She exhibits no tenderness.  Abdominal: Soft. Bowel sounds are normal. She exhibits no distension and no mass. There is no tenderness. There is no rebound and no guarding.  Musculoskeletal: She exhibits no edema.  Lymphadenopathy:    She has no cervical adenopathy.  Neurological: She is alert and oriented to person, place, and time. She has normal patellar reflexes. She exhibits normal muscle tone. Coordination normal.  Skin: Skin is warm and dry.  Psychiatric: She has a normal mood and affect. Her behavior is normal. Judgment and thought content normal.  Breast/Pelvic: deferred            Assessment & Plan:       Assessment & Plan:   Problem List Items Addressed This Visit       Unprioritized   Preventative health care   Pap/mammo/colo up to date. Continue healthy diet and regular exercise.  Declines flu  shot today- will get at work. Declines covid booster. Declines Hep B vaccine.       INSOMNIA   Good relief with trazodone .        Relevant Medications   traZODone  (DESYREL ) 100 MG tablet   GERD (gastroesophageal reflux disease)   Tried to stop taking omeprazole  symptoms returned- will try every other day.       Relevant Medications   omeprazole  (PRILOSEC) 20 MG capsule   Anxiety and depression   Reports stable mood on sertraline  100mg . Continue same.       Relevant Medications   traZODone  (DESYREL ) 100 MG tablet   sertraline  (ZOLOFT ) 100 MG tablet   Other Visit Diagnoses       Hyperglycemia    -  Primary   Relevant Orders   HgB A1c     Hyperlipidemia, unspecified hyperlipidemia type       Relevant Orders   Lipid panel       I have discontinued Elizzie R. Salvaggio's Librarian, academic and OVER THE COUNTER MEDICATION. I am also having her maintain her cetirizine, ascorbic acid, tretinoin, clobetasol , Vitamin D -3, triamcinolone ointment, Hyrimoz , omeprazole , traZODone , and sertraline .  Meds ordered this encounter  Medications   omeprazole  (PRILOSEC) 20 MG capsule    Sig: Take 1 capsule (20 mg total) by mouth daily.    Dispense:  90 capsule    Refill:  4    Supervising Provider:   DOMENICA BLACKBIRD A [[id number]]   traZODone  (DESYREL ) 100 MG tablet    Sig: Take 1  tablet (100 mg total) by mouth at bedtime.    Dispense:  90 tablet    Refill:  4    Supervising Provider:   DOMENICA BLACKBIRD A [[id number]]   sertraline  (ZOLOFT ) 100 MG tablet    Sig: Take 1 tablet (100 mg total) by mouth 2 (two) times daily.    Dispense:  180 tablet    Refill:  4    Supervising Provider:   DOMENICA BLACKBIRD A [[id number]]

## 2024-01-30 NOTE — Assessment & Plan Note (Signed)
 Good relief with trazodone .

## 2024-01-30 NOTE — Assessment & Plan Note (Signed)
 Reports stable mood on sertraline  100mg . Continue same.

## 2024-01-31 ENCOUNTER — Ambulatory Visit: Payer: Self-pay | Admitting: Family

## 2024-02-01 ENCOUNTER — Other Ambulatory Visit: Payer: Self-pay

## 2024-02-01 DIAGNOSIS — Z79899 Other long term (current) drug therapy: Secondary | ICD-10-CM

## 2024-02-01 DIAGNOSIS — Z111 Encounter for screening for respiratory tuberculosis: Secondary | ICD-10-CM

## 2024-02-02 ENCOUNTER — Ambulatory Visit: Payer: Self-pay | Admitting: Rheumatology

## 2024-02-04 LAB — COMPREHENSIVE METABOLIC PANEL WITH GFR
AG Ratio: 2.1 (calc) (ref 1.0–2.5)
ALT: 14 U/L (ref 6–29)
AST: 14 U/L (ref 10–35)
Albumin: 4.9 g/dL (ref 3.6–5.1)
Alkaline phosphatase (APISO): 38 U/L (ref 31–125)
BUN: 13 mg/dL (ref 7–25)
CO2: 26 mmol/L (ref 20–32)
Calcium: 9.6 mg/dL (ref 8.6–10.2)
Chloride: 103 mmol/L (ref 98–110)
Creat: 0.72 mg/dL (ref 0.50–0.99)
Globulin: 2.3 g/dL (ref 1.9–3.7)
Glucose, Bld: 87 mg/dL (ref 65–139)
Potassium: 4.3 mmol/L (ref 3.5–5.3)
Sodium: 137 mmol/L (ref 135–146)
Total Bilirubin: 0.8 mg/dL (ref 0.2–1.2)
Total Protein: 7.2 g/dL (ref 6.1–8.1)
eGFR: 104 mL/min/1.73m2 (ref >=60)

## 2024-02-04 LAB — QUANTIFERON-TB GOLD PLUS
Mitogen-NIL: >10 [IU]/mL
NIL: 0.03 [IU]/mL
QuantiFERON-TB Gold Plus: NEGATIVE
TB1-NIL: <0 [IU]/mL
TB2-NIL: <0 [IU]/mL

## 2024-02-04 LAB — CBC WITH DIFFERENTIAL/PLATELET
Absolute Lymphocytes: 2649 {cells}/uL (ref 850–3900)
Absolute Monocytes: 619 {cells}/uL (ref 200–950)
Basophils Absolute: 60 {cells}/uL (ref 0–200)
Basophils Relative: 0.7 %
Eosinophils Absolute: 327 {cells}/uL (ref 15–500)
Eosinophils Relative: 3.8 %
HCT: 39.9 % (ref 35.0–45.0)
Hemoglobin: 13.3 g/dL (ref 11.7–15.5)
MCH: 29.1 pg (ref 27.0–33.0)
MCHC: 33.3 g/dL (ref 32.0–36.0)
MCV: 87.3 fL (ref 80.0–100.0)
MPV: 9.9 fL (ref 7.5–12.5)
Monocytes Relative: 7.2 %
Neutro Abs: 4945 {cells}/uL (ref 1500–7800)
Neutrophils Relative %: 57.5 %
Platelets: 232 Thousand/uL (ref 140–400)
RBC: 4.57 Million/uL (ref 3.80–5.10)
RDW: 13.3 % (ref 11.0–15.0)
Total Lymphocyte: 30.8 %
WBC: 8.6 Thousand/uL (ref 3.8–10.8)

## 2024-02-13 ENCOUNTER — Other Ambulatory Visit: Payer: Self-pay | Admitting: Physician Assistant

## 2024-02-13 DIAGNOSIS — L409 Psoriasis, unspecified: Secondary | ICD-10-CM

## 2024-02-13 DIAGNOSIS — L4050 Arthropathic psoriasis, unspecified: Secondary | ICD-10-CM

## 2024-02-13 DIAGNOSIS — Z79899 Other long term (current) drug therapy: Secondary | ICD-10-CM

## 2024-02-14 NOTE — Telephone Encounter (Signed)
 Last Fill: 11/21/2023  Labs: 02/01/2024 CBC/CMP WNL  TB Gold: 02/01/2024  Neg  Next Visit: 03/20/2024  Last Visit: 10/19/2023  DX: Psoriatic arthritis   Current Dose per office note 10/19/2023: Hyrimoz  40 mg sq injections every 14 days   Okay to refill Hyrimoz ?

## 2024-03-06 NOTE — Progress Notes (Unsigned)
 Office Visit Note  Patient: [name] [name]             Date of Birth: [birth year]           MRN: [id number]             PCP: Daryl Setter, NP Referring: Daryl Setter, NP Visit Date: 03/20/2024 Occupation: [employer]   Subjective:  Medication monitoring   History of Present Illness: [name] [name] is a 48 y.o. female with history of psoriatic arthritis.  Patient remains on hyrimoz  40 mg sq injections every 14 days.  She is tolerating Hyrimoz  without any side effects or injection site reactions.  Patient states that she is experiencing some increased pain and inflammation in the right fifth PIP joint currently.  Patient states she is experiencing increased discomfort in the right shoulder, which started about 2 weeks ago after lifting something off a shelf at the grocery store.  Patient states that she had been doing well after undergoing right shoulder surgery in September 2025.  She has not yet followed back up with her orthopedic surgeon.  She denies any SI joint pain.  She denies any Achilles tendinitis or plantar fasciitis.  She denies any active psoriasis at this time.  Patient states that she has noticed some itching of the scalp and has used clobetasol  solution as needed. She denies any recent or recurrent infections. She denies any new medical conditions.      Activities of Daily Living:  Patient reports morning stiffness for a few minutes.   Patient Denies nocturnal pain.  Difficulty dressing/grooming: Denies Difficulty climbing stairs: Denies Difficulty getting out of chair: Denies Difficulty using hands for taps, buttons, cutlery, and/or writing: Denies  Review of Systems  Constitutional:  Negative for fatigue.  HENT:  Negative for mouth sores and mouth dryness.   Eyes:  Negative for dryness.  Respiratory:  Negative for shortness of breath.   Cardiovascular:  Negative for chest pain and palpitations.  Gastrointestinal:  Negative for  blood in stool, constipation and diarrhea.  Endocrine: Negative for increased urination.  Genitourinary:  Negative for involuntary urination.  Musculoskeletal:  Positive for joint pain, joint pain, joint swelling and morning stiffness. Negative for gait problem, myalgias, muscle weakness, muscle tenderness and myalgias.  Skin:  Negative for color change, rash, hair loss and sensitivity to sunlight.  Allergic/Immunologic: Negative for susceptible to infections.  Neurological:  Negative for dizziness and headaches.  Hematological:  Negative for swollen glands.  Psychiatric/Behavioral:  Negative for depressed mood and sleep disturbance. The patient is not nervous/anxious.     PMFS History:  Patient Active Problem List   Diagnosis Date Noted   Weight gain 12/20/2022   GERD (gastroesophageal reflux disease) 05/03/2022   DDD (degenerative disc disease), lumbar 04/11/2022   Lateral epicondylitis, right elbow 04/27/2021   CMC (carpometacarpal) synovitis 02/15/2021   Lateral epicondylitis of left elbow 02/15/2021   Shoulder impingement syndrome, right 02/15/2021   Preventative health care 02/02/2021   Arthralgia 02/02/2021   Thumb injury, right, initial encounter 11/08/2017   Sciatica 07/10/2014   Low back pain 05/10/2012   UNSPECIFIED MYALGIA AND MYOSITIS 04/28/2008   Anxiety and depression 09/08/2006   Depressive disorder, not elsewhere classified 09/08/2006   Allergic rhinitis 09/06/2006   INSOMNIA 09/06/2006    Past Medical History:  Diagnosis Date   Allergy    Anal fissure    Anxiety    Arthritis    Depression    External hemorrhoids  Family History  Problem Relation Age of Onset   Colitis Mother    Basal cell carcinoma Mother    Arthritis Mother    Breast cancer Mother 32   Crohn's disease Father        questionable diagnosis   Protein C deficiency Sister    Deep vein thrombosis Sister    Pulmonary embolism Sister    Other Sister        pre-diabetes   Other  Sister        pre-diabetes   Ankylosing spondylitis Sister    Colitis Sister    Other Brother        pre-diabetes   Heart disease Paternal Grandmother    Dementia Paternal Grandfather    Depression Paternal Grandfather    Colon cancer Neg Hx    Cancer Neg Hx    Drug abuse Neg Hx    Stroke Neg Hx    Rectal cancer Neg Hx    Stomach cancer Neg Hx    Past Surgical History:  Procedure Laterality Date   BREAST BIOPSY Right 06/20/2022   MM RT BREAST BX W LOC DEV 1ST LESION IMAGE BX SPEC STEREO GUIDE 06/20/2022 [facility]   COLONOSCOPY N/A 12/14/2012   Procedure: COLONOSCOPY;  Surgeon: Princella CHRISTELLA Nida, MD;  Location: [facility];  Service: Endoscopy;  Laterality: N/A;   ORIF FINGER / THUMB FRACTURE Right    right shoulder surgery Right 01/2024   WISDOM TOOTH EXTRACTION     Social History   Tobacco Use   Smoking status: Former    Current packs/day: 0.00    Types: Cigarettes    Quit date: 05/02/2001    Years since quitting: 22.8    Passive exposure: Past   Smokeless tobacco: Never  Vaping Use   Vaping status: Never Used  Substance Use Topics   Alcohol use: Yes    Comment: 3 drinks a week   Drug use: No   Social History   Social History Narrative   Works at [employer] with husband   No children    2 golden doodles     Immunization History  Administered Date(s) Administered   Influenza,inj,Quad PF,6+ Mos 03/27/2017, 04/03/2018, 02/26/2019, 02/02/2021   Moderna Sars-Covid-2 Vaccination 11/13/2019, 12/11/2019   Td 06/29/2009, 11/06/2017     Objective: Vital Signs: BP 120/84   Pulse 75   Temp 98.1 F (36.7 C)   Resp 14   Ht 5' 4 (1.626 m)   Wt 144 lb (65.3 kg)   BMI 24.72 kg/m    Physical Exam Vitals and nursing note reviewed.  Constitutional:      Appearance: She is well-developed.  HENT:     Head: Normocephalic and atraumatic.  Eyes:     Conjunctiva/sclera: Conjunctivae normal.  Cardiovascular:     Rate and Rhythm: Normal rate and  regular rhythm.     Heart sounds: Normal heart sounds.  Pulmonary:     Effort: Pulmonary effort is normal.     Breath sounds: Normal breath sounds.  Abdominal:     General: Bowel sounds are normal.     Palpations: Abdomen is soft.  Musculoskeletal:     Cervical back: Normal range of motion.  Lymphadenopathy:     Cervical: No cervical adenopathy.  Skin:    General: Skin is warm and dry.     Capillary Refill: Capillary refill takes less than 2 seconds.  Neurological:     Mental Status: She is alert and oriented to  person, place, and time.  Psychiatric:        Behavior: Behavior normal.      Musculoskeletal Exam: C-spine, thoracic spine, lumbar spine have good range of motion.  No midline spinal tenderness.  No SI joint tenderness.  Shoulder joints, elbow joints, wrist joints, MCPs, PIPs, DIPs have good range of motion.  Tenderness and inflammation noted in the right fifth PIP joint.  Complete fist formation bilaterally.  Hip joints have good range of motion with no groin pain.  Knee joints have good range of motion no warmth or effusion.  Ankle joints have good range of motion no tenderness or joint swelling.  No evidence of Achilles tendinitis or plantar fasciitis.   CDAI Exam: CDAI Score: -- Patient Global: --; Provider Global: -- Swollen: --; Tender: -- Joint Exam 03/20/2024   No joint exam has been documented for this visit   There is currently no information documented on the homunculus. Go to the Rheumatology activity and complete the homunculus joint exam.  Investigation: No additional findings.  Imaging: No results found.  Recent Labs: Lab Results  Component Value Date   WBC 8.6 02/01/2024   HGB 13.3 02/01/2024   PLT 232 02/01/2024   NA 137 02/01/2024   K 4.3 02/01/2024   CL 103 02/01/2024   CO2 26 02/01/2024   GLUCOSE 87 02/01/2024   BUN 13 02/01/2024   CREATININE 0.72 02/01/2024   BILITOT 0.8 02/01/2024   ALKPHOS 40 07/10/2019   AST 14 02/01/2024   ALT  14 02/01/2024   PROT 7.2 02/01/2024   ALBUMIN 4.3 07/10/2019   CALCIUM  9.6 02/01/2024   GFRAA  10/05/2008    >60        The eGFR has been calculated using the MDRD equation. This calculation has not been validated in all clinical situations. eGFR's persistently <60 mL/min signify possible Chronic Kidney Disease.   QFTBGOLDPLUS NEGATIVE 02/01/2024    Speciality Comments: No specialty comments available.  Procedures:  No procedures performed Allergies: Patient has no known allergies.   Assessment / Plan:     Visit Diagnoses: Psoriatic arthritis [facility]): She presents today with tenderness and inflammation in the right 5th PIP joint.  No evidence of Achilles tendinitis or plantar fasciitis.  No SI joint tenderness upon palpation.  Patient is currently on Hyrimoz  40 mg sq injections once every 14 days.  She is tolerating Hyrimoz  without any side effects or injection site reactions.  No recent gaps in therapy. Discussed the use of Voltaren  gel which she can apply topically as needed for pain relief. No medication changes will be made at this time.  She is vies notify us  if she develops any signs or symptoms of a flare.  She will follow-up in the office in 5 months or sooner if needed.  Psoriasis: Not active psoriasis currently.   High risk medication use - Hyrimoz  40 mg sq injections every 14 days. Hyrimoz  was initiated on 02/22/2023. Inadequate response to Stelara  45 mg sq injections every 12 weeks--initiated Stelara  on 05/18/22 TB gold negative on 02/01/24.  CBC and CMP updated on 02/01/24.  Her next lab work will be due in January and every 3 months.  No recent or recurrent infections. Discussed the importance of holding hyrimoz  if she develops signs or symptoms of an infection and to resume once the infection has completely cleared.   HLA B27 positive  Shoulder impingement syndrome, right - Under care of Dr. Draper-Cortisone injection on 06/28/23.  Underwent surgical intervention in  September 2025.  Patient started to have a recurrence of discomfort in the right shoulder about 2 weeks ago.  Pain in both hands: She presents today with tenderness and inflammation in the right 5th PIP joint.  Discussed the use of voltaren  gel which she can apply topically as needed for pain relief.    Pain in right hip - X-rays of the right hip joint were unremarkable.  She has good range of motion of the right hip with no groin pain currently.  Pain in both feet: She has good range of motion of both ankle joints with no tenderness or synovitis.  No evidence of Achilles tendinitis or plantar fasciitis.  Neck pain - X-rays with history of cervical spondylosis with C6-C7 narrowing.  C-spine has good range of motion with no discomfort at this time.  No symptoms of radiculopathy.  Degeneration of intervertebral disc of lumbar region without discogenic back pain or lower extremity pain: No symptoms of radiculopathy.  Chronic SI joint pain: No SI joint tenderness upon palpation.  Other medical conditions are listed as follows:  Collagenous colitis  Adjustment insomnia  Other fatigue  Anxiety and depression  Family history of ulcerative colitis-mother  Family history of Crohn's disease-father  Family history of ankylosing spondylitis-Sister  Orders: No orders of the defined types were placed in this encounter.  No orders of the defined types were placed in this encounter.    Follow-Up Instructions: Return in about 5 months (around 08/18/2024) for Psoriatic arthritis.   Waddell CHRISTELLA Craze, PA-C  Note - This record has been created using Dragon software.  Chart creation errors have been sought, but may not always  have been located. Such creation errors do not reflect on  the standard of medical care.

## 2024-03-20 ENCOUNTER — Ambulatory Visit: Attending: Physician Assistant | Admitting: Physician Assistant

## 2024-03-20 ENCOUNTER — Encounter: Payer: Self-pay | Admitting: Physician Assistant

## 2024-03-20 VITALS — BP 120/84 | HR 75 | Temp 98.1°F | Resp 14 | Ht 64.0 in | Wt 144.0 lb

## 2024-03-20 DIAGNOSIS — K52831 Collagenous colitis: Secondary | ICD-10-CM

## 2024-03-20 DIAGNOSIS — Z1589 Genetic susceptibility to other disease: Secondary | ICD-10-CM

## 2024-03-20 DIAGNOSIS — M79672 Pain in left foot: Secondary | ICD-10-CM

## 2024-03-20 DIAGNOSIS — L409 Psoriasis, unspecified: Secondary | ICD-10-CM

## 2024-03-20 DIAGNOSIS — M79641 Pain in right hand: Secondary | ICD-10-CM

## 2024-03-20 DIAGNOSIS — M51369 Other intervertebral disc degeneration, lumbar region without mention of lumbar back pain or lower extremity pain: Secondary | ICD-10-CM

## 2024-03-20 DIAGNOSIS — Z79899 Other long term (current) drug therapy: Secondary | ICD-10-CM

## 2024-03-20 DIAGNOSIS — M7541 Impingement syndrome of right shoulder: Secondary | ICD-10-CM

## 2024-03-20 DIAGNOSIS — F419 Anxiety disorder, unspecified: Secondary | ICD-10-CM

## 2024-03-20 DIAGNOSIS — Z8379 Family history of other diseases of the digestive system: Secondary | ICD-10-CM

## 2024-03-20 DIAGNOSIS — Z8269 Family history of other diseases of the musculoskeletal system and connective tissue: Secondary | ICD-10-CM

## 2024-03-20 DIAGNOSIS — R5383 Other fatigue: Secondary | ICD-10-CM

## 2024-03-20 DIAGNOSIS — M25551 Pain in right hip: Secondary | ICD-10-CM

## 2024-03-20 DIAGNOSIS — L4050 Arthropathic psoriasis, unspecified: Secondary | ICD-10-CM

## 2024-03-20 DIAGNOSIS — M79671 Pain in right foot: Secondary | ICD-10-CM

## 2024-03-20 DIAGNOSIS — M542 Cervicalgia: Secondary | ICD-10-CM

## 2024-03-20 DIAGNOSIS — M79642 Pain in left hand: Secondary | ICD-10-CM

## 2024-03-20 DIAGNOSIS — M533 Sacrococcygeal disorders, not elsewhere classified: Secondary | ICD-10-CM

## 2024-03-20 DIAGNOSIS — G8929 Other chronic pain: Secondary | ICD-10-CM

## 2024-03-20 DIAGNOSIS — F32A Depression, unspecified: Secondary | ICD-10-CM

## 2024-03-20 DIAGNOSIS — F5102 Adjustment insomnia: Secondary | ICD-10-CM

## 2024-03-20 NOTE — Patient Instructions (Signed)
 Standing Labs We placed an order today for your standing lab work.   Please have your standing labs drawn in January and every 3 months   Please have your labs drawn 2 weeks prior to your appointment so that the provider can discuss your lab results at your appointment, if possible.  Please note that you may see your imaging and lab results in MyChart before we have reviewed them. We will contact you once all results are reviewed. Please allow our office up to 72 hours to thoroughly review all of the results before contacting the office for clarification of your results.  WALK-IN LAB HOURS  Monday through Thursday from 8:00 am -12:30 pm and 1:00 pm-4:30 pm and Friday from 8:00 am-12:00 pm.  Patients with office visits requiring labs will be seen before walk-in labs.  You may encounter longer than normal wait times. Please allow additional time. Wait times may be shorter on  Monday and Thursday afternoons.  We do not book appointments for walk-in labs. We appreciate your patience and understanding with our staff.   Labs are drawn by [facility]. Please bring your co-pay at the time of your lab draw.  You may receive a bill from [facility] for your lab work.  Please note if you are on Hydroxychloroquine  and and an order has been placed for a Hydroxychloroquine  level,  you will need to have it drawn 4 hours or more after your last dose.  If you wish to have your labs drawn at another location, please call the office 24 hours in advance so we can fax the orders.  The office is located at [address], [address], [place], [place] [address]   If you have any questions regarding directions or hours of operation,  please call [phone].   As a reminder, please drink plenty of water prior to coming for your lab work. Thanks!

## 2024-05-06 ENCOUNTER — Other Ambulatory Visit: Payer: Self-pay | Admitting: Rheumatology

## 2024-05-06 DIAGNOSIS — L409 Psoriasis, unspecified: Secondary | ICD-10-CM

## 2024-05-06 DIAGNOSIS — L4050 Arthropathic psoriasis, unspecified: Secondary | ICD-10-CM

## 2024-05-06 DIAGNOSIS — Z79899 Other long term (current) drug therapy: Secondary | ICD-10-CM

## 2024-05-07 NOTE — Telephone Encounter (Signed)
 Last Fill: 02/14/2024  Labs: 02/01/2024 WNL  TB Gold: 02/01/2024 Neg    Next Visit: 08/19/2024  Last Visit: 03/20/2024  IK:Ednmpjupr arthritis   Current Dose per office note 03/20/2024: Hyrimoz  40 mg sq injections every 14 days   Okay to refill Hyrimoz ?

## 2024-05-21 ENCOUNTER — Other Ambulatory Visit: Payer: Self-pay | Admitting: *Deleted

## 2024-05-21 DIAGNOSIS — Z79899 Other long term (current) drug therapy: Secondary | ICD-10-CM

## 2024-05-22 ENCOUNTER — Ambulatory Visit: Payer: Self-pay | Admitting: Rheumatology

## 2024-05-22 LAB — COMPREHENSIVE METABOLIC PANEL WITH GFR
AG Ratio: 2.3 (calc) (ref 1.0–2.5)
ALT: 24 U/L (ref 6–29)
AST: 17 U/L (ref 10–35)
Albumin: 4.9 g/dL (ref 3.6–5.1)
Alkaline phosphatase (APISO): 38 U/L (ref 31–125)
BUN: 19 mg/dL (ref 7–25)
CO2: 25 mmol/L (ref 20–32)
Calcium: 9.5 mg/dL (ref 8.6–10.2)
Chloride: 104 mmol/L (ref 98–110)
Creat: 0.74 mg/dL (ref 0.50–0.99)
Globulin: 2.1 g/dL (ref 1.9–3.7)
Glucose, Bld: 86 mg/dL (ref 65–99)
Potassium: 4.3 mmol/L (ref 3.5–5.3)
Sodium: 137 mmol/L (ref 135–146)
Total Bilirubin: 0.5 mg/dL (ref 0.2–1.2)
Total Protein: 7 g/dL (ref 6.1–8.1)
eGFR: 101 mL/min/1.73m2

## 2024-05-22 LAB — CBC WITH DIFFERENTIAL/PLATELET
Absolute Lymphocytes: 2252 {cells}/uL (ref 850–3900)
Absolute Monocytes: 665 {cells}/uL (ref 200–950)
Basophils Absolute: 48 {cells}/uL (ref 0–200)
Basophils Relative: 0.5 %
Eosinophils Absolute: 190 {cells}/uL (ref 15–500)
Eosinophils Relative: 2 %
HCT: 41.9 % (ref 35.9–46.0)
Hemoglobin: 13.4 g/dL (ref 11.7–15.5)
MCH: 28.5 pg (ref 27.0–33.0)
MCHC: 32 g/dL (ref 31.6–35.4)
MCV: 89.1 fL (ref 81.4–101.7)
MPV: 9.8 fL (ref 7.5–12.5)
Monocytes Relative: 7 %
Neutro Abs: 6346 {cells}/uL (ref 1500–7800)
Neutrophils Relative %: 66.8 %
Platelets: 246 Thousand/uL (ref 140–400)
RBC: 4.7 Million/uL (ref 3.80–5.10)
RDW: 12.5 % (ref 11.0–15.0)
Total Lymphocyte: 23.7 %
WBC: 9.5 Thousand/uL (ref 3.8–10.8)

## 2024-08-19 ENCOUNTER — Ambulatory Visit: Admitting: Physician Assistant
